# Patient Record
Sex: Male | Born: 1985 | Race: White | Hispanic: No | Marital: Single | Smoking: Current some day smoker
Health system: Southern US, Community
[De-identification: ages and names within clinical notes are randomized; demographics above are authoritative.]

## PROBLEM LIST (undated history)

## (undated) DIAGNOSIS — K760 Fatty (change of) liver, not elsewhere classified: Secondary | ICD-10-CM

## (undated) DIAGNOSIS — F419 Anxiety disorder, unspecified: Secondary | ICD-10-CM

## (undated) DIAGNOSIS — K219 Gastro-esophageal reflux disease without esophagitis: Secondary | ICD-10-CM

## (undated) DIAGNOSIS — I10 Essential (primary) hypertension: Secondary | ICD-10-CM

## (undated) DIAGNOSIS — E785 Hyperlipidemia, unspecified: Secondary | ICD-10-CM

## (undated) DIAGNOSIS — J45909 Unspecified asthma, uncomplicated: Secondary | ICD-10-CM

## (undated) HISTORY — DX: Essential (primary) hypertension: I10

## (undated) HISTORY — DX: Unspecified asthma, uncomplicated: J45.909

## (undated) HISTORY — DX: Anxiety disorder, unspecified: F41.9

## (undated) HISTORY — DX: Hyperlipidemia, unspecified: E78.5

## (undated) HISTORY — DX: Fatty (change of) liver, not elsewhere classified: K76.0

## (undated) HISTORY — DX: Gastro-esophageal reflux disease without esophagitis: K21.9

---

## 2008-03-28 ENCOUNTER — Ambulatory Visit: Payer: Self-pay | Admitting: Family Medicine

## 2008-04-21 ENCOUNTER — Encounter: Payer: Self-pay | Admitting: Family Medicine

## 2008-11-28 ENCOUNTER — Ambulatory Visit: Payer: Self-pay | Admitting: Family Medicine

## 2008-11-28 DIAGNOSIS — H6120 Impacted cerumen, unspecified ear: Secondary | ICD-10-CM | POA: Insufficient documentation

## 2008-11-28 DIAGNOSIS — L259 Unspecified contact dermatitis, unspecified cause: Secondary | ICD-10-CM | POA: Insufficient documentation

## 2008-12-23 ENCOUNTER — Ambulatory Visit: Payer: Self-pay | Admitting: Family Medicine

## 2008-12-23 DIAGNOSIS — J019 Acute sinusitis, unspecified: Secondary | ICD-10-CM | POA: Insufficient documentation

## 2009-05-13 ENCOUNTER — Ambulatory Visit: Payer: Self-pay | Admitting: Family Medicine

## 2010-12-22 ENCOUNTER — Emergency Department: Payer: Self-pay | Admitting: Emergency Medicine

## 2015-04-30 ENCOUNTER — Other Ambulatory Visit: Payer: Self-pay | Admitting: Family Medicine

## 2015-05-01 LAB — CMP12+LP+TP+TSH+6AC+CBC/D/PLT
A/G RATIO: 1.9 (ref 1.1–2.5)
ALBUMIN: 4.5 g/dL (ref 3.5–5.5)
ALK PHOS: 55 IU/L (ref 39–117)
ALT: 28 IU/L (ref 0–44)
AST: 25 IU/L (ref 0–40)
BASOS: 0 %
BILIRUBIN TOTAL: 1 mg/dL (ref 0.0–1.2)
BUN/Creatinine Ratio: 10 (ref 8–19)
BUN: 10 mg/dL (ref 6–20)
Basophils Absolute: 0 10*3/uL (ref 0.0–0.2)
CHOLESTEROL TOTAL: 205 mg/dL — AB (ref 100–199)
Calcium: 9.5 mg/dL (ref 8.7–10.2)
Chloride: 99 mmol/L (ref 97–106)
Chol/HDL Ratio: 5.5 ratio units — ABNORMAL HIGH (ref 0.0–5.0)
Creatinine, Ser: 0.99 mg/dL (ref 0.76–1.27)
EOS (ABSOLUTE): 0.3 10*3/uL (ref 0.0–0.4)
Eos: 5 %
Estimated CHD Risk: 1.2 times avg. — ABNORMAL HIGH (ref 0.0–1.0)
FREE THYROXINE INDEX: 1.9 (ref 1.2–4.9)
GFR calc Af Amer: 118 mL/min/{1.73_m2} (ref >59)
GFR calc non Af Amer: 102 mL/min/{1.73_m2} (ref >59)
GGT: 29 IU/L (ref 0–65)
GLOBULIN, TOTAL: 2.4 g/dL (ref 1.5–4.5)
Glucose: 85 mg/dL (ref 65–99)
HDL: 37 mg/dL — AB (ref >39)
HEMATOCRIT: 45.9 % (ref 37.5–51.0)
Hemoglobin: 15.6 g/dL (ref 12.6–17.7)
IMMATURE GRANS (ABS): 0 10*3/uL (ref 0.0–0.1)
IMMATURE GRANULOCYTES: 0 %
Iron: 126 ug/dL (ref 38–169)
LDH: 178 IU/L (ref 121–224)
LDL CALC: 145 mg/dL — AB (ref 0–99)
LYMPHS: 41 %
Lymphocytes Absolute: 2.5 10*3/uL (ref 0.7–3.1)
MCH: 30.4 pg (ref 26.6–33.0)
MCHC: 34 g/dL (ref 31.5–35.7)
MCV: 89 fL (ref 79–97)
MONOCYTES: 8 %
MONOS ABS: 0.5 10*3/uL (ref 0.1–0.9)
NEUTROS PCT: 46 %
Neutrophils Absolute: 2.7 10*3/uL (ref 1.4–7.0)
Phosphorus: 3 mg/dL (ref 2.5–4.5)
Platelets: 261 10*3/uL (ref 150–379)
Potassium: 4.6 mmol/L (ref 3.5–5.2)
RBC: 5.14 x10E6/uL (ref 4.14–5.80)
RDW: 13.9 % (ref 12.3–15.4)
SODIUM: 140 mmol/L (ref 136–144)
T3 Uptake Ratio: 29 % (ref 24–39)
T4, Total: 6.7 ug/dL (ref 4.5–12.0)
TRIGLYCERIDES: 117 mg/dL (ref 0–149)
TSH: 0.96 u[IU]/mL (ref 0.450–4.500)
Total Protein: 6.9 g/dL (ref 6.0–8.5)
Uric Acid: 4.9 mg/dL (ref 3.7–8.6)
VLDL CHOLESTEROL CAL: 23 mg/dL (ref 5–40)
WBC: 6 10*3/uL (ref 3.4–10.8)

## 2015-05-01 LAB — HGB A1C W/O EAG: Hgb A1c MFr Bld: 5.6 % (ref 4.8–5.6)

## 2016-06-10 ENCOUNTER — Other Ambulatory Visit: Payer: Self-pay | Admitting: Family Medicine

## 2016-09-26 ENCOUNTER — Ambulatory Visit: Payer: Self-pay | Admitting: *Deleted

## 2016-09-26 DIAGNOSIS — W57XXXA Bitten or stung by nonvenomous insect and other nonvenomous arthropods, initial encounter: Secondary | ICD-10-CM

## 2016-09-26 NOTE — Progress Notes (Signed)
Pt presents with what he believes to be spider bite to L posterior FA. Occurred yesterday. Some mild swelling, erythema, and warmth present at site with one visible central lesion. No streaking present. No drainage per pt. No scabbing present. Red, swollen area marked with pen for re-eval tomorrow. Oval shaped with largest circumference approx 2 inches wide. Hydrocortisone cream provided for itching. Pt denies pain. Sts this is similar to a bite that he had last year on his leg that healed well with no intervention.

## 2016-09-27 ENCOUNTER — Ambulatory Visit: Payer: Self-pay | Admitting: Registered Nurse

## 2016-09-27 VITALS — BP 120/86 | HR 60 | Temp 98.1°F

## 2016-09-27 DIAGNOSIS — S50862A Insect bite (nonvenomous) of left forearm, initial encounter: Secondary | ICD-10-CM

## 2016-09-27 DIAGNOSIS — L03114 Cellulitis of left upper limb: Secondary | ICD-10-CM

## 2016-09-27 MED ORDER — TRIPLE ANTIBIOTIC 5-400-5000 EX OINT: TOPICAL_OINTMENT | Freq: Four times a day (QID) | CUTANEOUS | 0 refills | Status: AC

## 2016-09-27 MED ORDER — HYDROCORTISONE 1 % EX LOTN: 1.0000 "application " | TOPICAL_LOTION | Freq: Two times a day (BID) | CUTANEOUS | 0 refills | Status: AC | PRN

## 2016-09-27 MED ORDER — CEPHALEXIN 500 MG PO CAPS: 500.0000 mg | ORAL_CAPSULE | Freq: Two times a day (BID) | ORAL | 0 refills | Status: AC
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
## 2016-09-27 NOTE — Patient Instructions (Addendum)
Do not scratch affected area Elevate if swelling Wash area with soap and water twice a day cover with bandaid after applying triple antibiotic ointment given from clinic stock Hydrocortisone 1% to affected area prn four times a day if itching given from clinic stock UD x 2 Calagel twice a day as needed for itching given from clinic stock 4 UD If redness expands to elbow follow up for re-evaluation same day Cryotherapy/ice 15 minutes 4 times per day  Cellulitis, Adult Cellulitis is a skin infection. The infected area is usually red and tender. This condition occurs most often in the arms and lower legs. The infection can travel to the muscles, blood, and underlying tissue and become serious. It is very important to get treated for this condition. What are the causes? Cellulitis is caused by bacteria. The bacteria enter through a break in the skin, such as a cut, burn, insect bite, open sore, or crack. What increases the risk? This condition is more likely to occur in people who:  Have a weak defense system (immune system).  Have open wounds on the skin such as cuts, burns, bites, and scrapes. Bacteria can enter the body through these open wounds.  Are older.  Have diabetes.  Have a type of long-lasting (chronic) liver disease (cirrhosis) or kidney disease.  Use IV drugs. What are the signs or symptoms? Symptoms of this condition include:  Redness, streaking, or spotting on the skin.  Swollen area of the skin.  Tenderness or pain when an area of the skin is touched.  Warm skin.  Fever.  Chills.  Blisters. How is this diagnosed? This condition is diagnosed based on a medical history and physical exam. You may also have tests, including:  Blood tests.  Lab tests.  Imaging tests. How is this treated? Treatment for this condition may include:  Medicines, such as antibiotic medicines or antihistamines.  Supportive care, such as rest and application of cold or warm  cloths (cold or warm compresses) to the skin.  Hospital care, if the condition is severe. The infection usually gets better within 1-2 days of treatment. Follow these instructions at home:  Take over-the-counter and prescription medicines only as told by your health care provider.  If you were prescribed an antibiotic medicine, take it as told by your health care provider. Do not stop taking the antibiotic even if you start to feel better.  Drink enough fluid to keep your urine clear or pale yellow.  Do not touch or rub the infected area.  Raise (elevate) the infected area above the level of your heart while you are sitting or lying down.  Apply warm or cold compresses to the affected area as told by your health care provider.  Keep all follow-up visits as told by your health care provider. This is important. These visits let your health care provider make sure a more serious infection is not developing. Contact a health care provider if:  You have a fever.  Your symptoms do not improve within 1-2 days of starting treatment.  Your bone or joint underneath the infected area becomes painful after the skin has healed.  Your infection returns in the same area or another area.  You notice a swollen bump in the infected area.  You develop new symptoms.  You have a general ill feeling (malaise) with muscle aches and pains. Get help right away if:  Your symptoms get worse.  You feel very sleepy.  You develop vomiting or diarrhea that persists.  You notice red streaks coming from the infected area.  Your red area gets larger or turns dark in color. This information is not intended to replace advice given to you by your health care provider. Make sure you discuss any questions you have with your health care provider. Document Released: 03/09/2005 Document Revised: 10/08/2015 Document Reviewed: 04/08/2015 Elsevier Interactive Patient Education  2017 [place],  Adult An insect bite can make your skin red, itchy, and swollen. An insect bite is different from an insect sting, which happens when an insect injects poison (venom) into the skin. Some insects can spread disease to people through a bite. However, most insect bites do not lead to disease and are not serious. What are the causes? Insects may bite for a variety of reasons, including:  Hunger.  To defend themselves. Insects that bite include:  Spiders.  Mosquitoes.  Ticks.  Fleas.  Ants.  Flies.  Bedbugs. What are the signs or symptoms? Symptoms of this condition include:  Itching or pain in the bite area.  Redness and swelling in the bite area.  An open wound (skin ulcer). In many cases, symptoms last for 2-4 days. How is this diagnosed? This condition is usually diagnosed based on symptoms and a physical exam. How is this treated? Treatment is usually not needed. Symptoms often go away on their own. When treatment is recommended, it may involve:  Applying a cream or lotion to the bitten area. This treatment helps with itching.  Taking an antibiotic medicine. This treatment is needed if the bite area gets infected.  Getting a tetanus shot.  Applying ice to the affected area.  Medicines called antihistamines. This treatment is needed if you develop an allergic reaction to the insect bite. Follow these instructions at home: Bite area care   Do not scratch the bite area.  Keep the bite area clean and dry. Wash it every day with soap and water as told by your health care provider.  Check the bite area every day for signs of infection. Check for:  More redness, swelling, or pain.  Fluid or blood.  Warmth.  Pus. Managing pain, itching, and swelling    You may apply a baking soda paste, cortisone cream, or calamine lotion to the bite area as told by your health care provider.  If directed, applyice to the bite area.  Put ice in a plastic bag.  Place a  towel between your skin and the bag.  Leave the ice on for 20 minutes, 2-3 times per day. Medicines   Apply or take over-the-counter and prescription medicines only as told by your health care provider.  If you were prescribed an antibiotic medicine, use it as told by your health care provider. Do not stop using the antibiotic even if your condition improves. General instructions   Keep all follow-up visits as told by your health care provider. This is important. How is this prevented? To help reduce your risk of insect bites:  When you are outdoors, wear clothing that covers your arms and legs.  Use insect repellent. The best insect repellents contain:  DEET, picaridin, oil of lemon eucalyptus (OLE), or IR3535.  Higher amounts of an active ingredient.  If your home windows do not have screens, consider installing them. Contact a health care provider if:  You have more redness, swelling, or pain in the bite area.  You have fluid, blood, or pus coming from the bite area.  The bite area feels warm to  the touch.  You have a fever. Get help right away if:  You have joint pain.  You have a rash.  You have shortness of breath.  You feel unusually tired or sleepy.  You have neck pain.  You have a headache.  You have unusual weakness.  You have chest pain.  You have nausea, vomiting, or pain in the abdomen. This information is not intended to replace advice given to you by your health care provider. Make sure you discuss any questions you have with your health care provider. Document Released: 07/07/2004 Document Revised: 01/27/2016 Document Reviewed: 12/07/2015 Elsevier Interactive Patient Education  2017 [employer].

## 2016-10-06 ENCOUNTER — Ambulatory Visit: Payer: Self-pay | Admitting: Registered Nurse

## 2016-10-06 VITALS — BP 135/89 | HR 69 | Temp 97.7°F

## 2016-10-06 DIAGNOSIS — R69 Illness, unspecified: Principal | ICD-10-CM

## 2016-10-06 DIAGNOSIS — H6593 Unspecified nonsuppurative otitis media, bilateral: Secondary | ICD-10-CM

## 2016-10-06 DIAGNOSIS — J111 Influenza due to unidentified influenza virus with other respiratory manifestations: Secondary | ICD-10-CM

## 2016-10-06 MED ORDER — ONDANSETRON HCL 8 MG PO TABS: 8.0000 mg | ORAL_TABLET | Freq: Two times a day (BID) | ORAL | 0 refills | Status: AC | PRN

## 2016-10-06 MED ORDER — SALINE SPRAY 0.65 % NA SOLN: 2.0000 | NASAL | 0 refills | Status: DC

## 2016-10-06 MED ORDER — OSELTAMIVIR PHOSPHATE 75 MG PO CAPS: 75.0000 mg | ORAL_CAPSULE | Freq: Two times a day (BID) | ORAL | 0 refills | Status: AC

## 2016-10-06 MED ORDER — FLUTICASONE PROPIONATE 50 MCG/ACT NA SUSP: 1.0000 | Freq: Two times a day (BID) | NASAL | 0 refills | Status: DC

## 2016-10-06 NOTE — Patient Instructions (Addendum)
If vomiting greater than 3 days follow up for re-evaluation, unable to urinate every 8 hours, coughing up blood, vomiting/peeing/pooping blood, shortness of breath, wheezing and/or lethargy/confusion.  Viral Gastroenteritis, Adult Viral gastroenteritis is also known as the stomach flu. This condition is caused by various viruses. These viruses can be passed from person to person very easily (are very contagious). This condition may affect your stomach, small intestine, and large intestine. It can cause sudden watery diarrhea, fever, and vomiting. Diarrhea and vomiting can make you feel weak and cause you to become dehydrated. You may not be able to keep fluids down. Dehydration can make you tired and thirsty, cause you to have a dry mouth, and decrease how often you urinate. Older adults and people with other diseases or a weak immune system are at higher risk for dehydration. It is important to replace the fluids that you lose from diarrhea and vomiting. If you become severely dehydrated, you may need to get fluids through an IV tube. What are the causes? Gastroenteritis is caused by various viruses, including rotavirus and norovirus. Norovirus is the most common cause in adults. You can get sick by eating food, drinking water, or touching a surface contaminated with one of these viruses. You can also get sick from sharing utensils or other personal items with an infected person. What increases the risk? This condition is more likely to develop in people:  Who have a weak defense system (immune system).  Who live with one or more children who are younger than 21 years old.  Who live in a nursing home.  Who go on cruise ships. What are the signs or symptoms? Symptoms of this condition start suddenly 1-2 days after exposure to a virus. Symptoms may last a few days or as long as a week. The most common symptoms are watery diarrhea and vomiting. Other symptoms  include:  Fever.  Headache.  Fatigue.  Pain in the abdomen.  Chills.  Weakness.  Nausea.  Muscle aches.  Loss of appetite. How is this diagnosed? This condition is diagnosed with a medical history and physical exam. You may also have a stool test to check for viruses or other infections. How is this treated? This condition typically goes away on its own. The focus of treatment is to restore lost fluids (rehydration). Your health care provider may recommend that you take an oral rehydration solution (ORS) to replace important salts and minerals (electrolytes) in your body. Severe cases of this condition may require giving fluids through an IV tube. Treatment may also include medicine to help with your symptoms. Follow these instructions at home: Follow instructions from your health care provider about how to care for yourself at home. Eating and drinking  Follow these recommendations as told by your health care provider:  Take an ORS. This is a drink that is sold at pharmacies and retail stores.  Drink clear fluids in small amounts as you are able. Clear fluids include water, ice chips, diluted fruit juice, and low-calorie sports drinks.  Eat bland, easy-to-digest foods in small amounts as you are able. These foods include bananas, applesauce, rice, lean meats, toast, and crackers.  Avoid fluids that contain a lot of sugar or caffeine, such as energy drinks, sports drinks, and soda.  Avoid alcohol.  Avoid spicy or fatty foods. General instructions    Drink enough fluid to keep your urine clear or pale yellow.  Wash your hands often. If soap and water are not available, use hand sanitizer.  Make sure that all people in your household wash their hands well and often.  Take over-the-counter and prescription medicines only as told by your health care provider.  Rest at home while you recover.  Watch your condition for any changes.  Take a warm bath to relieve any  burning or pain from frequent diarrhea episodes.  Keep all follow-up visits as told by your health care provider. This is important. Contact a health care provider if:  You cannot keep fluids down.  Your symptoms get worse.  You have new symptoms.  You feel light-headed or dizzy.  You have muscle cramps. Get help right away if:  You have chest pain.  You feel extremely weak or you faint.  You see blood in your vomit.  Your vomit looks like coffee grounds.  You have bloody or black stools or stools that look like tar.  You have a severe headache, a stiff neck, or both.  You have a rash.  You have severe pain, cramping, or bloating in your abdomen.  You have trouble breathing or you are breathing very quickly.  Your heart is beating very quickly.  Your skin feels cold and clammy.  You feel confused.  You have pain when you urinate.  You have signs of dehydration, such as:  Dark urine, very little urine, or no urine.  Cracked lips.  Dry mouth.  Sunken eyes.  Sleepiness.  Weakness. This information is not intended to replace advice given to you by your health care provider. Make sure you discuss any questions you have with your health care provider. Document Released: 05/30/2005 Document Revised: 11/11/2015 Document Reviewed: 02/03/2015 Elsevier Interactive Patient Education  2017 [place].   Influenza, Adult Influenza, more commonly known as "the flu," is a viral infection that primarily affects the respiratory tract. The respiratory tract includes organs that help you breathe, such as the lungs, nose, and throat. The flu causes many common cold symptoms, as well as a high fever and body aches. The flu spreads easily from person to person (is contagious). Getting a flu shot (influenza vaccination) every year is the best way to prevent influenza. What are the causes? Influenza is caused by a virus. You can catch the virus by:  Breathing in droplets  from an infected person's cough or sneeze.  Touching something that was recently contaminated with the virus and then touching your mouth, nose, or eyes. What increases the risk? The following factors may make you more likely to get the flu:  Not cleaning your hands frequently with soap and water or alcohol-based hand sanitizer.  Having close contact with many people during cold and flu season.  Touching your mouth, eyes, or nose without washing or sanitizing your hands first.  Not drinking enough fluids or not eating a healthy diet.  Not getting enough sleep or exercise.  Being under a high amount of stress.  Not getting a yearly (annual) flu shot. You may be at a higher risk of complications from the flu, such as a severe lung infection (pneumonia), if you:  Are over the age of 26.  Are pregnant.  Have a weakened disease-fighting system (immune system). You may have a weakened immune system if you:  Have HIV or AIDS.  Are undergoing chemotherapy.  Aretaking medicines that reduce the activity of (suppress) the immune system.  Have a long-term (chronic) illness, such as heart disease, kidney disease, diabetes, or lung disease.  Have a liver disorder.  Are obese.  Have anemia.  What are the signs or symptoms? Symptoms of this condition typically last 4-10 days and may include:  Fever.  Chills.  Headache, body aches, or muscle aches.  Sore throat.  Cough.  Runny or congested nose.  Chest discomfort and cough.  Poor appetite.  Weakness or tiredness (fatigue).  Dizziness.  Nausea or vomiting. How is this diagnosed? This condition may be diagnosed based on your medical history and a physical exam. Your health care provider may do a nose or throat swab test to confirm the diagnosis. How is this treated? If influenza is detected early, you can be treated with antiviral medicine that can reduce the length of your illness and the severity of your symptoms.  This medicine may be given by mouth (orally) or through an IV tube that is inserted in one of your veins. The goal of treatment is to relieve symptoms by taking care of yourself at home. This may include taking over-the-counter medicines, drinking plenty of fluids, and adding humidity to the air in your home. In some cases, influenza goes away on its own. Severe influenza or complications from influenza may be treated in a hospital. Follow these instructions at home:  Take over-the-counter and prescription medicines only as told by your health care provider.  Use a cool mist humidifier to add humidity to the air in your home. This can make breathing easier.  Rest as needed.  Drink enough fluid to keep your urine clear or pale yellow.  Cover your mouth and nose when you cough or sneeze.  Wash your hands with soap and water often, especially after you cough or sneeze. If soap and water are not available, use hand sanitizer.  Stay home from work or school as told by your health care provider. Unless you are visiting your health care provider, try to avoid leaving home until your fever has been gone for 24 hours without the use of medicine.  Keep all follow-up visits as told by your health care provider. This is important. How is this prevented?  Getting an annual flu shot is the best way to avoid getting the flu. You may get the flu shot in late summer, fall, or winter. Ask your health care provider when you should get your flu shot.  Wash your hands often or use hand sanitizer often.  Avoid contact with people who are sick during cold and flu season.  Eat a healthy diet, drink plenty of fluids, get enough sleep, and exercise regularly. Contact a health care provider if:  You develop new symptoms.  You have:  Chest pain.  Diarrhea.  A fever.  Your cough gets worse.  You produce more mucus.  You feel nauseous or you vomit. Get help right away if:  You develop shortness of  breath or difficulty breathing.  Your skin or nails turn a bluish color.  You have severe pain or stiffness in your neck.  You develop a sudden headache or sudden pain in your face or ear.  You cannot stop vomiting. This information is not intended to replace advice given to you by your health care provider. Make sure you discuss any questions you have with your health care provider. Document Released: 05/27/2000 Document Revised: 11/05/2015 Document Reviewed: 03/24/2015 Elsevier Interactive Patient Education  2017 [employer].

## 2016-10-06 NOTE — Progress Notes (Signed)
Subjective:    Patient ID: Bruce Kemp, male    DOB: 1985/06/18, 31 y.o.   MRN: [id number]  30y/o single caucasian male reports yesterday having progressively worsening body aches, fatigue, chills, nausea, sore throat and nasal congestion and drainage. Much improved upon waking this am, but mild sx still present. Takes etodolac po BID.  Was hot/cold/sweaty bed last night.  Exposed to sick coworkers in past week.  Denied sinus congestion/pressure.      Review of Systems  Constitutional: Positive for activity change, appetite change, chills, diaphoresis and fatigue. Negative for fever and unexpected weight change.  HENT: Positive for postnasal drip, rhinorrhea, sneezing and sore throat. Negative for congestion, dental problem, drooling, ear discharge, ear pain, facial swelling, hearing loss, mouth sores, nosebleeds, sinus pain, sinus pressure, tinnitus, trouble swallowing and voice change.   Eyes: Negative for photophobia, pain, discharge, redness, itching and visual disturbance.  Respiratory: Positive for cough. Negative for choking, chest tightness, shortness of breath, wheezing and stridor.   Cardiovascular: Negative for chest pain, palpitations and leg swelling.  Gastrointestinal: Positive for nausea. Negative for abdominal distention, abdominal pain, blood in stool, constipation, diarrhea and vomiting.  Endocrine: Negative for cold intolerance and heat intolerance.  Genitourinary: Negative for dysuria.  Musculoskeletal: Positive for myalgias. Negative for arthralgias, back pain, gait problem, joint swelling, neck pain and neck stiffness.  Skin: Negative for color change, pallor, rash and wound.  Allergic/Immunologic: Positive for environmental allergies. Negative for food allergies and immunocompromised state.  Neurological: Positive for headaches. Negative for dizziness, tremors, seizures, syncope, facial asymmetry, speech difficulty, weakness, light-headedness and numbness.   Hematological: Negative for adenopathy. Does not bruise/bleed easily.  Psychiatric/Behavioral: Negative for agitation, behavioral problems, confusion and sleep disturbance.       Objective:   Physical Exam  Constitutional: He is oriented to person, place, and time. Vital signs are normal. He appears well-developed and well-nourished. He is active and cooperative.  Non-toxic appearance. He does not have a sickly appearance. He appears ill. No distress.  HENT:  Head: Normocephalic and atraumatic.  Right Ear: Hearing, external ear and ear canal normal. Tympanic membrane is scarred. A middle ear effusion is present.  Left Ear: Hearing, external ear and ear canal normal. Tympanic membrane is scarred. A middle ear effusion is present.  Ears:  Nose: Mucosal edema and rhinorrhea present. No nose lacerations, sinus tenderness, nasal deformity, septal deviation or nasal septal hematoma. No epistaxis.  No foreign bodies. Right sinus exhibits no maxillary sinus tenderness and no frontal sinus tenderness. Left sinus exhibits no maxillary sinus tenderness and no frontal sinus tenderness.  Mouth/Throat: Uvula is midline and mucous membranes are normal. Mucous membranes are not pale, not dry and not cyanotic. He does not have dentures. No oral lesions. No trismus in the jaw. Normal dentition. No dental abscesses, uvula swelling, lacerations or dental caries. Posterior oropharyngeal edema and posterior oropharyngeal erythema present. No oropharyngeal exudate or tonsillar abscesses.  Cobblestoning posterior pharynx; bilateral nasal turbinates edema/erythema clear discharge; bilateral allergic shiners; bilateral  TMs air fluid level clear and scarring noted see graphic  Eyes: Conjunctivae, EOM and lids are normal. Pupils are equal, round, and reactive to light. Right eye exhibits no chemosis, no discharge, no exudate and no hordeolum. No foreign body present in the right eye. Left eye exhibits no chemosis, no  discharge, no exudate and no hordeolum. No foreign body present in the left eye. Right conjunctiva is not injected. Right conjunctiva has no hemorrhage. Left conjunctiva is not injected.  Left conjunctiva has no hemorrhage. No scleral icterus. Right eye exhibits normal extraocular motion and no nystagmus. Left eye exhibits normal extraocular motion and no nystagmus. Right pupil is round and reactive. Left pupil is round and reactive. Pupils are equal.  Neck: Trachea normal and normal range of motion. Neck supple. No tracheal tenderness, no spinous process tenderness and no muscular tenderness present. No neck rigidity. No tracheal deviation, no edema, no erythema and normal range of motion present. No thyroid mass and no thyromegaly present.  Cardiovascular: Normal rate, regular rhythm, S1 normal, S2 normal, normal heart sounds and intact distal pulses.  PMI is not displaced.  Exam reveals no gallop and no friction rub.   No murmur heard. Pulmonary/Chest: Effort normal and breath sounds normal. No stridor. No respiratory distress. He has no decreased breath sounds. He has no wheezes. He has no rhonchi. He has no rales.  Speaks full sentences without difficulty no cough observed in exam room  Abdominal: Soft. Normal appearance. He exhibits no shifting dullness, no distension, no pulsatile liver, no fluid wave, no abdominal bruit, no ascites, no pulsatile midline mass and no mass. Bowel sounds are decreased. There is no hepatosplenomegaly. There is no tenderness. There is no rigidity, no rebound, no guarding, no CVA tenderness, no tenderness at McBurney's point and negative Murphy's sign. No hernia. Hernia confirmed negative in the ventral area.  Dull to percussion x 4 quads; hypoactive bowel sounds x 4 quads  Musculoskeletal: Normal range of motion. He exhibits no edema or tenderness.       Right shoulder: Normal.       Left shoulder: Normal.       Right elbow: Normal.      Left elbow: Normal.       Right  hip: Normal.       Left hip: Normal.       Right knee: Normal.       Left knee: Normal.       Right ankle: Normal.       Left ankle: Normal.       Cervical back: Normal.       Thoracic back: Normal.       Lumbar back: Normal.       Right hand: Normal.       Left hand: Normal.  Lymphadenopathy:       Head (right side): No submental, no submandibular, no tonsillar, no preauricular, no posterior auricular and no occipital adenopathy present.       Head (left side): No submental, no submandibular, no tonsillar, no preauricular, no posterior auricular and no occipital adenopathy present.    He has no cervical adenopathy.       Right cervical: No superficial cervical, no deep cervical and no posterior cervical adenopathy present.      Left cervical: No superficial cervical, no deep cervical and no posterior cervical adenopathy present.  Neurological: He is alert and oriented to person, place, and time. He has normal strength. He displays no atrophy and no tremor. No cranial nerve deficit or sensory deficit. He exhibits normal muscle tone. He displays no seizure activity. Coordination and gait normal. GCS eye subscore is 4. GCS verbal subscore is 5. GCS motor subscore is 6.  Bilateral hand grasp equal 5/5 on/off exam table without difficulty in out chair without difficulty gait sure and steady hall  Skin: Skin is warm, dry and intact. No abrasion, no bruising, no burn, no ecchymosis, no laceration, no lesion, no petechiae and no rash  noted. He is not diaphoretic. No cyanosis or erythema. No pallor. Nails show no clubbing.  Psychiatric: He has a normal mood and affect. His speech is normal and behavior is normal. Judgment and thought content normal. Cognition and memory are normal.  Nursing note and vitals reviewed.         Assessment & Plan:  A-influenza like illness and otitis media effusion bilaterally  P-given nasal saline 1 from clinic stock; flonase at home restart; continue etodolac;  phenyleprine 5mg  po q6h prn rhinitis 4 UD given from clinic stock Electronic Rx tamiflu 75mg  po BID x 5 days #10 RF0 and zofran 8mg  ODT po BID prn nausea/vomiting.  Hydrate hydrate hydrate.if unable to maintain po intake or urine orange/brown/unable to void every 8 hours follow up with PCM/UCC/ER for re-evaluation.  Honey with lemon.  Avoid motrin/advil/aleve/naproxen due to etodolac use.  Suspect Viral illness: no evidence of invasive bacterial infection, non toxic and well hydrated.  This is most likely self limiting viral infection.  I do not see where any further testing or imaging is necessary at this time.   I will suggest supportive care, rest, good hygiene and encourage the patient to take adequate fluids.  Does not require work excuse. Patient to talk with supervisor discussed recommend staying home until afebrile 24 hours off etodolac. nasal saline 1-2 sprays each nostril prn q2h wa prn congestion,.  Discussed honey with lemon and salt water gargles for comfort also.  The patient is to return to clinic or EMERGENCY ROOM if symptoms worsen or change significantly e.g. Dyspnea, dysphagia, vomiting, lethargy, SOB, wheezing. Patient verbalized agreement and understanding of treatment plan and had no further questions at this time.  Supportive treatment.   No evidence of invasive bacterial infection, non toxic and well hydrated.  This is most likely self limiting viral infection.  I do not see where any further testing or imaging is necessary at this time.   I will suggest supportive care, rest, good hygiene and encourage the patient to take adequate fluids.  The patient is to return to clinic or EMERGENCY ROOM if symptoms worsen or change significantly e.g. ear pain, fever, purulent discharge from ears or bleeding.  Exitcare handout on otitis media with effusion given to patient.  Patient verbalized agreement and understanding of treatment plan.

## 2016-10-11 ENCOUNTER — Ambulatory Visit: Payer: Self-pay | Admitting: Registered Nurse

## 2016-10-11 VITALS — BP 129/90 | HR 70 | Temp 97.9°F

## 2016-10-11 DIAGNOSIS — J029 Acute pharyngitis, unspecified: Secondary | ICD-10-CM

## 2016-10-11 DIAGNOSIS — H1013 Acute atopic conjunctivitis, bilateral: Secondary | ICD-10-CM

## 2016-10-11 DIAGNOSIS — J209 Acute bronchitis, unspecified: Secondary | ICD-10-CM

## 2016-10-11 DIAGNOSIS — J309 Allergic rhinitis, unspecified: Secondary | ICD-10-CM

## 2016-10-11 DIAGNOSIS — H6593 Unspecified nonsuppurative otitis media, bilateral: Secondary | ICD-10-CM

## 2016-10-11 MED ORDER — ALBUTEROL SULFATE HFA 108 (90 BASE) MCG/ACT IN AERS: 1.0000 | INHALATION_SPRAY | RESPIRATORY_TRACT | 0 refills | Status: DC | PRN

## 2016-10-11 MED ORDER — CETIRIZINE HCL 10 MG PO TABS: 10.0000 mg | ORAL_TABLET | Freq: Every day | ORAL | 11 refills | Status: DC

## 2016-10-11 MED ORDER — KETOTIFEN FUMARATE 0.025 % OP SOLN: 1.0000 [drp] | Freq: Two times a day (BID) | OPHTHALMIC | 0 refills | Status: AC

## 2016-10-11 MED ORDER — AMOXICILLIN-POT CLAVULANATE 875-125 MG PO TABS: 1.0000 | ORAL_TABLET | Freq: Two times a day (BID) | ORAL | 0 refills | Status: AC

## 2016-10-11 MED ORDER — ERYTHROMYCIN 5 MG/GM OP OINT: 1.0000 "application " | TOPICAL_OINTMENT | Freq: Three times a day (TID) | OPHTHALMIC | 0 refills | Status: AC

## 2016-10-11 MED ORDER — CARBOXYMETHYLCELLULOSE SODIUM 1 % OP SOLN: 1.0000 [drp] | Freq: Three times a day (TID) | OPHTHALMIC | 12 refills | Status: AC

## 2016-10-11 NOTE — Patient Instructions (Addendum)
start OTC antihistamine (zyrtec or claritin 10mg  by mouth daily) Continue etodolac twice a day by mouth, flonase 1 spray each nostril twice a day and nasal saline in the shower Shower twice a day Do not sleep with the windows open Honey with lemon Hydrate nyquil 1 teaspoon at bedtime as needed for cough per manufacturer's instructions If wheezing/protracted coughing start albuterol inhaler 1-2 puffs by mouth every 4-6 hours and follow up for re-evaluation if you have been taking augmentin 875mg  by mouth every 12 hours for 48 hours already (take augmentin for 10 days) Start refresh plus 2 drops each eye three times per day as needed for irritation/dryness due to any cause Clean linens/pillowcase more frequently if discharge on them Avoid rubbing eyes, wash hands frequently Erythromycin opthalmic ointment 1cm ribbon into bilateral eyes at least every 12 hours x 1 week Ketotifen 1 drop each eye twice a day for 30 days as needed for itching/irritation due to seasonal allergies Follow up re-evaluation if shortness of breath, coughing up blood, visual changes, headaches, fever greater than 100.5 and/or no improvement with plan of care x 48 hours   Allergic Rhinitis Allergic rhinitis is when the mucous membranes in the nose respond to allergens. Allergens are particles in the air that cause your body to have an allergic reaction. This causes you to release allergic antibodies. Through a chain of events, these eventually cause you to release histamine into the blood stream. Although meant to protect the body, it is this release of histamine that causes your discomfort, such as frequent sneezing, congestion, and an itchy, runny nose. What are the causes? Seasonal allergic rhinitis (hay fever) is caused by pollen allergens that may come from grasses, trees, and weeds. Year-round allergic rhinitis (perennial allergic rhinitis) is caused by allergens such as house dust mites, pet dander, and mold  spores. What are the signs or symptoms?  Nasal stuffiness (congestion).  Itchy, runny nose with sneezing and tearing of the eyes. How is this diagnosed? Your health care provider can help you determine the allergen or allergens that trigger your symptoms. If you and your health care provider are unable to determine the allergen, skin or blood testing may be used. Your health care provider will diagnose your condition after taking your health history and performing a physical exam. Your health care provider may assess you for other related conditions, such as asthma, pink eye, or an ear infection. How is this treated? Allergic rhinitis does not have a cure, but it can be controlled by:  Medicines that block allergy symptoms. These may include allergy shots, nasal sprays, and oral antihistamines.  Avoiding the allergen. Hay fever may often be treated with antihistamines in pill or nasal spray forms. Antihistamines block the effects of histamine. There are over-the-counter medicines that may help with nasal congestion and swelling around the eyes. Check with your health care provider before taking or giving this medicine. If avoiding the allergen or the medicine prescribed do not work, there are many new medicines your health care provider can prescribe. Stronger medicine may be used if initial measures are ineffective. Desensitizing injections can be used if medicine and avoidance does not work. Desensitization is when a patient is given ongoing shots until the body becomes less sensitive to the allergen. Make sure you follow up with your health care provider if problems continue. Follow these instructions at home: It is not possible to completely avoid allergens, but you can reduce your symptoms by taking steps to limit your exposure  to them. It helps to know exactly what you are allergic to so that you can avoid your specific triggers. Contact a health care provider if:  You have a fever.  You  develop a cough that does not stop easily (persistent).  You have shortness of breath.  You start wheezing.  Symptoms interfere with normal daily activities. This information is not intended to replace advice given to you by your health care provider. Make sure you discuss any questions you have with your health care provider. Document Released: 02/22/2001 Document Revised: 01/29/2016 Document Reviewed: 02/04/2013 Elsevier Interactive Patient Education  2017 [employer] Acute Bronchitis, Adult Acute bronchitis is sudden (acute) swelling of the air tubes (bronchi) in the lungs. Acute bronchitis causes these tubes to fill with mucus, which can make it hard to breathe. It can also cause coughing or wheezing. In adults, acute bronchitis usually goes away within 2 weeks. A cough caused by bronchitis may last up to 3 weeks. Smoking, allergies, and asthma can make the condition worse. Repeated episodes of bronchitis may cause further lung problems, such as chronic obstructive pulmonary disease (COPD). What are the causes? This condition can be caused by germs and by substances that irritate the lungs, including:  Cold and flu viruses. This condition is most often caused by the same virus that causes a cold.  Bacteria.  Exposure to tobacco smoke, dust, fumes, and air pollution. What increases the risk? This condition is more likely to develop in people who:  Have close contact with someone with acute bronchitis.  Are exposed to lung irritants, such as tobacco smoke, dust, fumes, and vapors.  Have a weak immune system.  Have a respiratory condition such as asthma. What are the signs or symptoms? Symptoms of this condition include:  A cough.  Coughing up clear, yellow, or green mucus.  Wheezing.  Chest congestion.  Shortness of breath.  A fever.  Body aches.  Chills.  A sore throat. How is this diagnosed? This condition is usually diagnosed with a physical exam. During  the exam, your health care provider may order tests, such as chest X-rays, to rule out other conditions. He or she may also:  Test a sample of your mucus for bacterial infection.  Check the level of oxygen in your blood. This is done to check for pneumonia.  Do a chest X-ray or lung function testing to rule out pneumonia and other conditions.  Perform blood tests. Your health care provider will also ask about your symptoms and medical history. How is this treated? Most cases of acute bronchitis clear up over time without treatment. Your health care provider may recommend:  Drinking more fluids. Drinking more makes your mucus thinner, which may make it easier to breathe.  Taking a medicine for a fever or cough.  Taking an antibiotic medicine.  Using an inhaler to help improve shortness of breath and to control a cough.  Using a cool mist vaporizer or humidifier to make it easier to breathe. Follow these instructions at home: Medicines   Take over-the-counter and prescription medicines only as told by your health care provider.  If you were prescribed an antibiotic, take it as told by your health care provider. Do not stop taking the antibiotic even if you start to feel better. General instructions   Get plenty of rest.  Drink enough fluids to keep your urine clear or pale yellow.  Avoid smoking and secondhand smoke. Exposure to cigarette smoke or irritating chemicals will make bronchitis  worse. If you smoke and you need help quitting, ask your health care provider. Quitting smoking will help your lungs heal faster.  Use an inhaler, cool mist vaporizer, or humidifier as told by your health care provider.  Keep all follow-up visits as told by your health care provider. This is important. How is this prevented? To lower your risk of getting this condition again:  Wash your hands often with soap and water. If soap and water are not available, use hand sanitizer.  Avoid contact  with people who have cold symptoms.  Try not to touch your hands to your mouth, nose, or eyes.  Make sure to get the flu shot every year. Contact a health care provider if:  Your symptoms do not improve in 2 weeks of treatment. Get help right away if:  You cough up blood.  You have chest pain.  You have severe shortness of breath.  You become dehydrated.  You faint or keep feeling like you are going to faint.  You keep vomiting.  You have a severe headache.  Your fever or chills gets worse. This information is not intended to replace advice given to you by your health care provider. Make sure you discuss any questions you have with your health care provider. Document Released: 07/07/2004 Document Revised: 12/23/2015 Document Reviewed: 11/18/2015 Elsevier Interactive Patient Education  2017 [employer] Pharyngitis Pharyngitis is redness, pain, and swelling (inflammation) of your pharynx. What are the causes? Pharyngitis is usually caused by infection. Most of the time, these infections are from viruses (viral) and are part of a cold. However, sometimes pharyngitis is caused by bacteria (bacterial). Pharyngitis can also be caused by allergies. Viral pharyngitis may be spread from person to person by coughing, sneezing, and personal items or utensils (cups, forks, spoons, toothbrushes). Bacterial pharyngitis may be spread from person to person by more intimate contact, such as kissing. What are the signs or symptoms? Symptoms of pharyngitis include:  Sore throat.  Tiredness (fatigue).  Low-grade fever.  Headache.  Joint pain and muscle aches.  Skin rashes.  Swollen lymph nodes.  Plaque-like film on throat or tonsils (often seen with bacterial pharyngitis). How is this diagnosed? Your health care provider will ask you questions about your illness and your symptoms. Your medical history, along with a physical exam, is often all that is needed to diagnose pharyngitis.  Sometimes, a rapid strep test is done. Other lab tests may also be done, depending on the suspected cause. How is this treated? Viral pharyngitis will usually get better in 3-4 days without the use of medicine. Bacterial pharyngitis is treated with medicines that kill germs (antibiotics). Follow these instructions at home:  Drink enough water and fluids to keep your urine clear or pale yellow.  Only take over-the-counter or prescription medicines as directed by your health care provider:  If you are prescribed antibiotics, make sure you finish them even if you start to feel better.  Do not take aspirin.  Get lots of rest.  Gargle with 8 oz of salt water ( tsp of salt per 1 qt of water) as often as every 1-2 hours to soothe your throat.  Throat lozenges (if you are not at risk for choking) or sprays may be used to soothe your throat. Contact a health care provider if:  You have large, tender lumps in your neck.  You have a rash.  You cough up green, yellow-brown, or bloody spit. Get help right away if:  Your neck becomes  stiff.  You drool or are unable to swallow liquids.  You vomit or are unable to keep medicines or liquids down.  You have severe pain that does not go away with the use of recommended medicines.  You have trouble breathing (not caused by a stuffy nose). This information is not intended to replace advice given to you by your health care provider. Make sure you discuss any questions you have with your health care provider. Document Released: 05/30/2005 Document Revised: 11/05/2015 Document Reviewed: 02/04/2013 Elsevier Interactive Patient Education  2017 [employer] Allergic Conjunctivitis, Adult Allergic conjunctivitis is inflammation of the clear membrane that covers the white part of your eye and the inner surface of your eyelid (conjunctiva). The inflammation is caused by allergies. The blood vessels in the conjunctiva become inflamed and this causes the  eyes to become red or pink. The eyes often feel itchy. Allergic conjunctivitis cannot be spread from one person to another person (is not contagious). What are the causes? This condition is caused by an allergic reaction. Common causes of an allergic reaction (allergens) include:  Outdoor allergens, such as:  Pollen.  Grass and weeds.  Mold spores.  Indoor allergens, such as:  Dust.  Smoke.  Mold.  Pet dander.  Animal hair. What increases the risk? You may be more likely to develop this condition if you have a family history of allergies, such as:  Allergic rhinitis.  Bronchial asthma.  Atopic dermatitis. What are the signs or symptoms? Symptoms of this condition include eyes that are:  Itchy.  Red.  Watery.  Puffy. Your eyes may also:  Sting or burn.  Have clear drainage coming from them. How is this diagnosed? This condition may be diagnosed by medical history and physical exam. If you have drainage from your eyes, it may be tested to rule out other causes of conjunctivitis. You may also need to see a health care provider who specializes in treating allergies (allergist) or eye conditions (ophthalmologist) for tests to confirm the diagnosis. You may have:  Skin tests to see which allergens are causing your symptoms. These tests involve pricking the skin with a tiny needle and exposing the skin to small amounts of potential allergens to see if your skin reacts.  Blood tests.  Tissue scrapings from your eyelid. These will be examined under a microscope. How is this treated? Treatments for this condition may include:  Cold cloths (compresses) to soothe itching and swelling.  Washing the face to remove allergens.  Eye drops. These may be prescription or over-the-counter. There are several different types. You may need to try different types to see which one works best for you. Your may need:  Eye drops that block the allergic reaction  (antihistamine).  Eye drops that reduce swelling and irritation (anti-inflammatory).  Steroid eye drops to lessen a severe reaction (vernal conjunctivitis).  Oral antihistamine medicines to reduce your allergic reaction. You may need these if eye drops do not help or are difficult to use. Follow these instructions at home:  Avoid known allergens whenever possible.  Take or apply over-the-counter and prescription medicines only as told by your health care provider. These include any eye drops.  Apply a cool, clean washcloth to your eye for 10-20 minutes, 3-4 times a day.  Do not touch or rub your eyes.  Do not wear contact lenses until the inflammation is gone. Wear glasses instead.  Do not wear eye makeup until the inflammation is gone.  Keep all follow-up visits as  told by your health care provider. This is important. Contact a health care provider if:  Your symptoms get worse or do not improve with treatment.  You have mild eye pain.  You have sensitivity to light.  You have spots or blisters on your eyes.  You have pus draining from your eye.  You have a fever. Get help right away if:  You have redness, swelling, or other symptoms in only one eye.  Your vision is blurred or you have vision changes.  You have severe eye pain. This information is not intended to replace advice given to you by your health care provider. Make sure you discuss any questions you have with your health care provider. Document Released: 08/20/2002 Document Revised: 01/27/2016 Document Reviewed: 12/11/2015 Elsevier Interactive Patient Education  2017 [place].  Bacterial Conjunctivitis Bacterial conjunctivitis is an infection of the clear membrane that covers the white part of your eye and the inner surface of your eyelid (conjunctiva). When the blood vessels in your conjunctiva become inflamed, your eye becomes red or pink, and it will probably feel itchy. Bacterial conjunctivitis  spreads very easily from person to person (is contagious). It also spreads easily from one eye to the other eye. What are the causes? This condition is caused by several common bacteria. You may get the infection if you come into close contact with another person who is infected. You may also come into contact with items that are contaminated with the bacteria, such as a face towel, contact lens solution, or eye makeup. What increases the risk? This condition is more likely to develop in people who:  Are exposed to other people who have the infection.  Wear contact lenses.  Have a sinus infection.  Have had a recent eye injury or surgery.  Have a weak body defense system (immune system).  Have a medical condition that causes dry eyes. What are the signs or symptoms? Symptoms of this condition include:  Eye redness.  Tearing or watery eyes.  Itchy eyes.  Burning feeling in your eyes.  Thick, yellowish discharge from an eye. This may turn into a crust on the eyelid overnight and cause your eyelids to stick together.  Swollen eyelids.  Blurred vision. How is this diagnosed? Your health care provider can diagnose this condition based on your symptoms and medical history. Your health care provider may also take a sample of discharge from your eye to find the cause of your infection. This is rarely done. How is this treated? Treatment for this condition includes:  Antibiotic eye drops or ointment to clear the infection more quickly and prevent the spread of infection to others.  Oral antibiotic medicines to treat infections that do not respond to drops or ointments, or last longer than 10 days.  Cool, wet cloths (cool compresses) placed on the eyes.  Artificial tears applied 2-6 times a day. Follow these instructions at home: Medicines   Take or apply your antibiotic medicine as told by your health care provider. Do not stop taking or applying the antibiotic even if you  start to feel better.  Take or apply over-the-counter and prescription medicines only as told by your health care provider.  Be very careful to avoid touching the edge of your eyelid with the eye drop bottle or the ointment tube when you apply medicines to the affected eye. This will keep you from spreading the infection to your other eye or to other people. Managing discomfort   Gently wipe away  any drainage from your eye with a warm, wet washcloth or a cotton ball.  Apply a cool, clean washcloth to your eye for 10-20 minutes, 3-4 times a day. General instructions   Do not wear contact lenses until the inflammation is gone and your health care provider says it is safe to wear them again. Ask your health care provider how to sterilize or replace your contact lenses before you use them again. Wear glasses until you can resume wearing contacts.  Avoid wearing eye makeup until the inflammation is gone. Throw away any old eye cosmetics that may be contaminated.  Change or wash your pillowcase every day.  Do not share towels or washcloths. This may spread the infection.  Wash your hands often with soap and water. Use paper towels to dry your hands.  Avoid touching or rubbing your eyes.  Do not drive or use heavy machinery if your vision is blurred. Contact a health care provider if:  You have a fever.  Your symptoms do not get better after 10 days. Get help right away if:  You have a fever and your symptoms suddenly get worse.  You have severe pain when you move your eye.  You have facial pain, redness, or swelling.  You have sudden loss of vision. This information is not intended to replace advice given to you by your health care provider. Make sure you discuss any questions you have with your health care provider. Document Released: 05/30/2005 Document Revised: 10/08/2015 Document Reviewed: 03/12/2015 Elsevier Interactive Patient Education  2017 [place].  Viral  Conjunctivitis, Adult Viral conjunctivitis is an inflammation of the clear membrane that covers the white part of your eye and the inner surface of your eyelid (conjunctiva). The inflammation is caused by a viral infection. The blood vessels in the conjunctiva become inflamed, causing the eye to become red or pink, and often itchy. Viral conjunctivitis can be easily passed from one person to another (is contagious). This condition is often called pink eye. What are the causes? This condition is caused by a virus. A virus is a type of contagious germ. It can be spread by touching objects that have been contaminated with the virus, such as doorknobs or towels. It can also be passed through droplets, such as from coughing or sneezing. What are the signs or symptoms? Symptoms of this condition include:  Eye redness.  Tearing or watery eyes.  Itchy and irritated eyes.  Burning feeling in the eyes.  Clear drainage from the eye.  Swollen eyelids.  A gritty feeling in the eye.  Light sensitivity. This condition often occurs with other symptoms, such as a fever, nausea, or a rash. How is this diagnosed? This condition is diagnosed with a medical history and physical exam. If you have discharge from your eye, the discharge may be tested to rule out other causes of conjunctivitis. How is this treated? Viral conjunctivitis does not respond to medicines that kill bacteria (antibiotics). Treatment for viral conjunctivitis is directed at stopping a bacterial infection from developing in addition to the viral infection. Treatment also aims to relieve your symptoms, such as itching. This may be done with antihistamine drops or other eye medicines. Rarely, steroid eye drops or antiviral medicines may be prescribed. Follow these instructions at home: Medicines    Take or apply over-the-counter and prescription medicines only as told by your health care provider.  Be very careful to avoid touching the  edge of the eyelid with the eye drop bottle  or ointment tube when applying medicines to the affected eye. Being careful this way will stop you from spreading the infection to the other eye or to other people. Eye care   Avoid touching or rubbing your eyes.  Apply a warm, wet, clean washcloth to your eye for 10-20 minutes, 3-4 times per day or as told by your health care provider.  If you wear contact lenses, do not wear them until the inflammation is gone and your health care provider says it is safe to wear them again. Ask your health care provider how to sterilize or replace your contact lenses before using them again. Wear glasses until you can resume wearing contacts.  Avoid wearing eye makeup until the inflammation is gone. Throw away any old eye cosmetics that may be contaminated.  Gently wipe away any drainage from your eye with a warm, wet washcloth or a cotton ball. General instructions   Change or wash your pillowcase every day or as told by your health care provider.  Do not share towels, pillowcases, washcloths, eye makeup, makeup brushes, contact lenses, or glasses. This may spread the infection.  Wash your hands often with soap and water. Use paper towels to dry your hands. If soap and water are not available, use hand sanitizer.  Try to avoid contact with other people for one week or as told by your health care provider. Contact a health care provider if:  Your symptoms do not improve with treatment or they get worse.  You have increased pain.  Your vision becomes blurry.  You have a fever.  You have facial pain, redness, or swelling.  You have yellow or green drainage coming from your eye.  You have new symptoms. This information is not intended to replace advice given to you by your health care provider. Make sure you discuss any questions you have with your health care provider. Document Released: 08/20/2002 Document Revised: 12/26/2015 Document Reviewed:  12/15/2015 Elsevier Interactive Patient Education  2017 [employer].

## 2016-10-11 NOTE — Progress Notes (Signed)
Subjective:    Patient ID: Bruce Kemp, male    DOB: 1986-05-03, 31 y.o.   MRN: [id number]  31y/o single caucasian male seen recently in clinic for similar sx that he reports have worsened since last week.  Influenza like symptoms last week. Throat more sore and scratchy and feels swollen per patient. Persistant cough, more productive now especially at night Eyes are irritated, itchy, red, with yellow drainage that crusts overnight. Using Flonase 1 spray each nostril twice a day and Nyquil po prn at bedtime. Did not take Tamiflu as body aches fever did not persist past 24 hours. Still on etodolac also denied fever. Symptoms worse when outside or windows open at home this weekend sinus congestion/pressure/discharge improved when inside/after shower/saline.  Nausea resolved.  Denied teeth pain, hemoptysis, rash, diarrhea, SOB, wheezing, headache, myalgias, DOE, swelling hands/feet, and/or ear discharge.       Review of Systems  Constitutional: Negative for activity change, appetite change, chills, diaphoresis, fatigue, fever and unexpected weight change.  HENT: Positive for congestion, postnasal drip, rhinorrhea, sinus pain, sinus pressure and sore throat. Negative for dental problem, drooling, ear discharge, ear pain, facial swelling, hearing loss, mouth sores, nosebleeds, sneezing, tinnitus, trouble swallowing and voice change.   Eyes: Positive for discharge, redness and itching. Negative for photophobia, pain and visual disturbance.  Respiratory: Positive for cough. Negative for choking, chest tightness, shortness of breath, wheezing and stridor.   Cardiovascular: Negative for chest pain, palpitations and leg swelling.  Gastrointestinal: Negative for abdominal distention, abdominal pain, blood in stool, constipation, diarrhea, nausea and vomiting.  Endocrine: Negative for cold intolerance and heat intolerance.  Genitourinary: Negative for dysuria.  Musculoskeletal: Negative for arthralgias,  back pain, gait problem, joint swelling, myalgias, neck pain and neck stiffness.  Skin: Negative for color change, pallor, rash and wound.  Allergic/Immunologic: Positive for environmental allergies. Negative for food allergies and immunocompromised state.  Neurological: Negative for dizziness, tremors, seizures, syncope, facial asymmetry, speech difficulty, weakness, light-headedness, numbness and headaches.  Hematological: Negative for adenopathy. Does not bruise/bleed easily.  Psychiatric/Behavioral: Positive for sleep disturbance. Negative for agitation, behavioral problems and confusion.       Objective:   Physical Exam  Constitutional: He is oriented to person, place, and time. Vital signs are normal. He appears well-developed and well-nourished. He is active and cooperative.  Non-toxic appearance. He does not have a sickly appearance. He appears ill. No distress.  HENT:  Head: Normocephalic and atraumatic.  Right Ear: Hearing, external ear and ear canal normal. Tympanic membrane is injected, scarred, erythematous and bulging. A middle ear effusion is present.  Left Ear: Hearing, external ear and ear canal normal. Tympanic membrane is scarred and bulging. A middle ear effusion is present.  Nose: Mucosal edema and rhinorrhea present. No nose lacerations, sinus tenderness, nasal deformity, septal deviation or nasal septal hematoma. No epistaxis.  No foreign bodies. Right sinus exhibits no maxillary sinus tenderness and no frontal sinus tenderness. Left sinus exhibits no maxillary sinus tenderness and no frontal sinus tenderness.  Mouth/Throat: Uvula is midline. Mucous membranes are not pale, not dry and not cyanotic. He does not have dentures. Oral lesions present. No trismus in the jaw. Normal dentition. No dental abscesses, uvula swelling, lacerations or dental caries. Posterior oropharyngeal edema and posterior oropharyngeal erythema present. No oropharyngeal exudate or tonsillar abscesses.   Maculopapular rash erythema oropharynx/hard palate; cobblestoning posterior pharynx; bilateral allergic shiners; bilateral TMs air fluid level clear bulging and scarring; left TM injection/erythema 10% centrally; bilateral nasal  turbinates edema/erythema clear discharge  Eyes: EOM and lids are normal. Pupils are equal, round, and reactive to light. Right eye exhibits no chemosis, no discharge, no exudate and no hordeolum. No foreign body present in the right eye. Left eye exhibits no chemosis, no discharge, no exudate and no hordeolum. No foreign body present in the left eye. Right conjunctiva is injected. Right conjunctiva has no hemorrhage. Left conjunctiva is injected. Left conjunctiva has no hemorrhage. No scleral icterus. Right eye exhibits normal extraocular motion and no nystagmus. Left eye exhibits normal extraocular motion and no nystagmus. Right pupil is round and reactive. Left pupil is round and reactive. Pupils are equal.  Left greater than right eye bulbar conjunctiva injection left 3+/4 right 2+/4; left vasculature inflammation bulbar also near vision 20/20 uncorrected  Neck: Trachea normal and normal range of motion. Neck supple. No tracheal tenderness, no spinous process tenderness and no muscular tenderness present. No neck rigidity. No tracheal deviation, no edema, no erythema and normal range of motion present. No thyroid mass and no thyromegaly present.  Cardiovascular: Normal rate, regular rhythm, S1 normal, S2 normal, normal heart sounds, intact distal pulses and normal pulses.  PMI is not displaced.  Exam reveals no gallop and no friction rub.   No murmur heard. Pulses:      Radial pulses are 2+ on the right side, and 2+ on the left side.  Pulmonary/Chest: Effort normal and breath sounds normal. No stridor. No respiratory distress. He has no decreased breath sounds. He has no wheezes. He has no rhonchi. He has no rales.  No cough observed in exam room; speaks full sentences without  difficulty  Abdominal: Soft. He exhibits no distension.  Musculoskeletal: Normal range of motion. He exhibits no edema, tenderness or deformity.       Right shoulder: Normal.       Left shoulder: Normal.       Right elbow: Normal.      Left elbow: Normal.       Right hip: Normal.       Left hip: Normal.       Right knee: Normal.       Left knee: Normal.       Right ankle: Normal.       Left ankle: Normal.       Cervical back: Normal.       Thoracic back: Normal.       Lumbar back: Normal.       Right hand: Normal.       Left hand: Normal.  Lymphadenopathy:       Head (right side): No submental, no submandibular, no tonsillar, no preauricular, no posterior auricular and no occipital adenopathy present.       Head (left side): No submental, no submandibular, no tonsillar, no preauricular, no posterior auricular and no occipital adenopathy present.    He has no cervical adenopathy.       Right cervical: No superficial cervical, no deep cervical and no posterior cervical adenopathy present.      Left cervical: No superficial cervical, no deep cervical and no posterior cervical adenopathy present.  Neurological: He is alert and oriented to person, place, and time. He is not disoriented. He displays no atrophy and no tremor. No cranial nerve deficit or sensory deficit. He exhibits normal muscle tone. He displays no seizure activity. Coordination and gait normal. GCS eye subscore is 4. GCS verbal subscore is 5. GCS motor subscore is 6.  Bilateral hand grasp equal  5/5; on/off exam table and in/out chair without difficulty gait sure and steady in hall  Skin: Skin is warm and intact. No abrasion, no bruising, no burn, no ecchymosis, no laceration, no lesion, no petechiae and no rash noted. He is diaphoretic. No cyanosis or erythema. No pallor. Nails show no clubbing.  Psychiatric: He has a normal mood and affect. His speech is normal and behavior is normal. Judgment and thought content normal.  Cognition and memory are normal.  Nursing note and vitals reviewed.         Assessment & Plan:  A-acute pharyngitis/bronchitis; bilateral otitis media; allergic conjunctivitis/rhinitis seasonal, acute bronchitis, otitis media effusion bilaterally  P-start zyrtec 10mg  po daily generic OTC, start augmentin 875mg  po BID x 10 days #20 RF0 dispensed from [facility].  Has flonase and nasal saline at home and etodolac for pain po BID.  Continue shower BID and honey with lemon and nyquil/cough drop po q2h prn cough.  Rx albuterol 1-2 puffs po q4-6h prn wheezing/protracted coughing #1 RF0start OTC antihistamine (zyrtec/claritin 10mg  by mouth daily) Continue etodolac twice a day by mouth, flonase 1 spray each nostril twice a day and nasal saline in the shower Shower twice a day Do not sleep with the windows open Honey with lemon Hydrate nyquil 1 teaspoon at bedtime as needed for cough per manufacturer's instructions If wheezing/protracted coughing start albuterol inhaler 1-2 puffs by mouth every 4-6 hours and follow up for re-evaluation if you have been taking augmentin 875mg  by mouth every 12 hours for 48 hours already (take augmentin for 10 days)  Given Rx for albuterol and augmentin today.  Augmentin dispensed from [facility] to patient.  Refused oral steroids and prescription cough suppressants today.  Bronchitis simple, community acquired, may have started as viral (probably respiratory syncytial, parainfluenza, influenza, or adenovirus), but now evidence of acute purulent bronchitis with resultant bronchial edema and mucus formation.  Viruses are the most common cause of bronchial inflammation in otherwise healthy adults with acute bronchitis.  The appearance of sputum is not predictive of whether a bacterial infection is present.  Purulent sputum is most often caused by viral infections.  There are a small portion of those caused by non-viral agents being Mycoplamsa pneumonia.  Microscopic examination or C&S of  sputum in the healthy adult with acute bronchitis is generally not helpful (usually negative or normal respiratory flora) other considerations being cough from upper respiratory tract infections, sinusitis or allergic syndromes (mild asthma or viral pneumonia).  Differential Diagnosis:  reactive airway disease (asthma, allergic aspergillosis (eosinophilia), chronic bronchitis, respiratory infection (Sinusitis, Common cold, pneumonia), congestive heart failure, reflux esophagitis, bronchogenic tumor, aspiration syndromes and/or exposure irritants/tobacco smoke.  In this case, there is no evidence of any invasive bacterial illness.  Most likely viral etiology so will hold on antibiotic treatment.  Advise supportive care with rest, encourage fluids, good hygiene and watch for any worsening symptoms.  If they were to develop:  come back to the office or go to the emergency room if after hours. Without high fever, severe dyspnea, lack of physical findings or other risk factors, I will hold on a chest radiograph and CBC at this time. I discussed that approximately 50% of patients with acute bronchitis have a cough that lasts up to three weeks, and 25% for over a month.  Tylenol, one to two tablets every four hours as needed for fever or myalgias.   No aspirin.  Patient instructed to follow up in one week or sooner if symptoms worsen.  Patient verbalized agreement and understanding of treatment plan.  P2:  hand washing and cover cough  Rapid strep and throat cultures not available at this location.  Patient with maculopapular erythematous rash oropharynx today worsening symptoms will start augmentin 875mg  po BID x 10 days #20 RF0 dispensed from [facility].  On etodolac po BID for MSK condition.  Usually no specific medical treatment is needed if a virus is causing the sore throat.  The throat most often gets better on its own within 5 to 7 days.  Antibiotic medicine does not cure viral pharyngitis.   For acute pharyngitis  caused by bacteria, your healthcare provider will prescribe an antibiotic.  Marland Kitchen Do not smoke.  Marland Kitchen Avoid secondhand smoke and other air pollutants.  . Use a cool mist humidifier to add moisture to the air.  . Get plenty of rest.  . You may want to rest your throat by talking less and eating a diet that is mostly liquid or soft for a day or two.   Marland Kitchen Nonprescription throat lozenges and mouthwashes should help relieve the soreness.   . Gargling with warm saltwater and drinking warm liquids may help.  (You can make a saltwater solution by adding 1/4 teaspoon of salt to 8 ounces, or 240 mL, of warm water.)  . A nonprescription pain reliever such as acetaminophen 1000mg  by mouth every 6 hours may ease general aches and pains.   FOLLOW UP with clinic provider if no improvements in the next 7-10 days.  Patient verbalized understanding of instructions and agreed with plan of care. P2:  Hand washing and diet.   Start refresh plus 2 drops each eye three times per day as needed for irritation/dryness due to any cause Clean linens/pillowcase more frequently if discharge on them Avoid rubbing eyes, wash hands frequently Erythromycin opthalmic ointment 1cm ribbon into bilateral eyes at least every 12 hours x 1 week Ketotifen 1 drop each eye twice a day for 30 days as needed for itching/irritation due to seasonal allergies Cleared for work  Start antihistamine medication.  Shower after allergen exposure prior to bed.  Hygiene discussed. Patient to apply warm packs prn as directed.  Instructed patient to not rub eyes.  May use over the counter eye drops/tears for pain/symptom relief.  Return to clinic if headache, fever, greater than 100.41F, nausea/vomiting, purulent discharge/matting unable to open eye without using fingers, foreign body sensation, ciliary flush, photophobia or vision changes.  Call or return to clinic as needed if these symptoms worsen or fail to improve as anticipated.  Patient given [facility]  handout on allergic conjunctivitis, viral conjunctivitis and bacterial conjunctivitis.  Patient verbalized agreement and understanding of treatment plan.   P2:  Hand washing, avoid contact use-wear glasses.  Follow up re-evaluation if shortness of breath, coughing up blood, visual changes, headaches, fever greater than 100.5 and/or no improvement with plan of care x 48 hours  Patient may use normal saline nasal spray as needed.  Consider antihistamine and continue nasal steroid use.  Avoid triggers if possible.  Shower prior to bedtime if exposed to triggers.  If allergic dust/dust mites recommend mattress/pillow covers/encasements; washing linens, vacuuming, sweeping, dusting weekly.  Call or return to clinic as needed if these symptoms worsen or fail to improve as anticipated.   [facility] handout on allergic rhinitis given to patient.  Patient verbalized understanding of instructions, agreed with plan of care and had no further questions at this time.   Supportive treatment.   No evidence of  invasive bacterial infection, non toxic and well hydrated.  This is most likely self limiting viral infection.  I do not see where any further testing or imaging is necessary at this time.   I will suggest supportive care, rest, good hygiene and encourage the patient to take adequate fluids.  The patient is to return to clinic or EMERGENCY ROOM if symptoms worsen or change significantly e.g. ear pain, fever, purulent discharge from ears or bleeding.  [facility] handout on otitis media with effusion given to patient.  Patient verbalized agreement and understanding of treatment plan.

## 2016-11-01 ENCOUNTER — Other Ambulatory Visit: Payer: Self-pay | Admitting: Family Medicine

## 2016-11-23 ENCOUNTER — Other Ambulatory Visit: Payer: Self-pay | Admitting: Family Medicine

## 2016-12-20 ENCOUNTER — Telehealth: Payer: Self-pay | Admitting: Registered Nurse

## 2016-12-20 ENCOUNTER — Encounter: Payer: Self-pay | Admitting: Registered Nurse

## 2016-12-20 DIAGNOSIS — M461 Sacroiliitis, not elsewhere classified: Secondary | ICD-10-CM | POA: Insufficient documentation

## 2016-12-20 MED ORDER — ETODOLAC 500 MG PO TABS: 500.0000 mg | ORAL_TABLET | Freq: Two times a day (BID) | ORAL | 3 refills | Status: DC

## 2016-12-20 NOTE — Telephone Encounter (Signed)
Patient requested refill etodolac last filled Jan 2018 500mg  po BID prn #180 RF3.  Take with food.  Follow up if new or worsening pain.

## 2017-01-10 ENCOUNTER — Ambulatory Visit: Payer: Self-pay | Admitting: Registered Nurse

## 2017-01-10 VITALS — BP 137/95 | HR 62 | Temp 98.5°F

## 2017-01-10 DIAGNOSIS — B880 Other acariasis: Secondary | ICD-10-CM

## 2017-01-10 NOTE — Progress Notes (Signed)
Subjective:    Patient ID: Bruce Kemp, male    DOB: Jul 03, 1985, 31 y.o.   MRN: [id number]  30y/o single caucasian male established patient c/o rash to bil feet, legs, groin and torso. Was outdoors on Saturday mowing in tall grass flip flops and shorts, rash appeared Sunday starting on lower legs/feet but has been spreading upwards since then. Rash itchy, nonpainful. Denied drainage/pus/fever/lesions between toes/fingers, tick bite/removal.  Could have been mosquito bites but thinks it was chiggers. Has tried calamine, hydrocortisone, technu gel helps itching for a little bit but then itching returns.  Taking benadryl at bedtime cannot take during the day due to drowsiness.      Review of Systems  Constitutional: Negative for activity change, appetite change, chills, diaphoresis, fatigue and fever.  HENT: Negative for congestion, ear pain and sore throat.   Eyes: Negative for pain and discharge.  Respiratory: Negative for cough and wheezing.   Cardiovascular: Negative for chest pain and leg swelling.  Gastrointestinal: Negative for blood in stool, constipation, diarrhea, nausea and vomiting.  Genitourinary: Negative for difficulty urinating, dysuria and hematuria.  Musculoskeletal: Negative for arthralgias, back pain, gait problem, joint swelling, myalgias, neck pain and neck stiffness.  Skin: Positive for color change and rash. Negative for pallor and wound.  Allergic/Immunologic: Positive for environmental allergies. Negative for food allergies.  Neurological: Negative for dizziness, tremors, seizures, syncope, facial asymmetry, speech difficulty, weakness, light-headedness, numbness and headaches.  Hematological: Negative for adenopathy. Does not bruise/bleed easily.  Psychiatric/Behavioral: Negative for agitation, confusion and sleep disturbance. The patient is not nervous/anxious.        Objective:   Physical Exam  Constitutional: He is oriented to person, place, and time. He  appears well-developed and well-nourished. He is active and cooperative.  Non-toxic appearance. He does not have a sickly appearance. He does not appear ill. No distress.  HENT:  Head: Normocephalic and atraumatic.  Right Ear: Hearing and external ear normal.  Left Ear: Hearing and external ear normal.  Nose: Nose normal.  Mouth/Throat: Uvula is midline, oropharynx is clear and moist and mucous membranes are normal. No oropharyngeal exudate.  Eyes: Pupils are equal, round, and reactive to light. Conjunctivae, EOM and lids are normal. Right eye exhibits no discharge. Left eye exhibits no discharge. No scleral icterus.  Neck: Trachea normal, normal range of motion and phonation normal. Neck supple. No tracheal tenderness and no muscular tenderness present. No neck rigidity. No tracheal deviation, no edema, no erythema and normal range of motion present. No thyromegaly present.  Cardiovascular: Normal rate, regular rhythm, normal heart sounds and intact distal pulses.   Pulses:      Radial pulses are 2+ on the right side, and 2+ on the left side.  Pulmonary/Chest: Effort normal and breath sounds normal. No stridor. No respiratory distress. He has no wheezes.  Abdominal: Soft. He exhibits no distension. There is no guarding.  Musculoskeletal: Normal range of motion. He exhibits no edema, tenderness or deformity.       Right shoulder: Normal.       Left shoulder: Normal.       Right elbow: Normal.      Left elbow: Normal.       Right hip: Normal.       Left hip: Normal.       Right knee: Normal.       Left knee: Normal.       Right ankle: Normal.       Left ankle: Normal.  Cervical back: Normal.       Thoracic back: Normal.       Lumbar back: Normal.       Right hand: Normal.       Left hand: Normal.  Lymphadenopathy:       Head (right side): No submental, no submandibular, no tonsillar, no preauricular, no posterior auricular and no occipital adenopathy present.       Head (left  side): No submental, no submandibular, no tonsillar, no preauricular, no posterior auricular and no occipital adenopathy present.    He has no cervical adenopathy.       Right cervical: No superficial cervical, no deep cervical and no posterior cervical adenopathy present.      Left cervical: No superficial cervical, no deep cervical and no posterior cervical adenopathy present.  Neurological: He is alert and oriented to person, place, and time. He is not disoriented. He displays no atrophy, no tremor and normal reflexes. No cranial nerve deficit or sensory deficit. He exhibits normal muscle tone. He displays no seizure activity. Coordination and gait normal. GCS eye subscore is 4. GCS verbal subscore is 5. GCS motor subscore is 6.  On/off exam table without difficulty; in/out of chair smooth; gait sure and steady in hall wearing flip flops  Skin: Skin is warm, dry and intact. Rash noted. No abrasion, no bruising, no burn, no ecchymosis, no laceration, no lesion, no petechiae and no purpura noted. Rash is papular, maculopapular and vesicular. Rash is not macular, not nodular, not pustular and not urticarial. He is not diaphoretic. There is erythema. No cyanosis. No pallor. Nails show no clubbing.     Scattered papules and vesicles on erythematous base largest number feet and lower legs and hips bilaterally; scant chest/back/arms; no lesions hands, face/neck  Psychiatric: He has a normal mood and affect. His speech is normal and behavior is normal. Judgment and thought content normal. Cognition and memory are normal.  Nursing note and vitals reviewed.         Assessment & Plan:  A-insect bites and pruritis  P-Topical hydrocortisone 1% apply BID prn itching along with technu gel TID prn itching (given 6 UD of each from clinic stock).  Benadryl 50mg  po qhs prn itching.  Ice prn topical affected areas.  If no relief will Rx atarax 25mg  po q8h prn itching #21 RF0  If worsening vesicles will start  prednisone taper also.  No infected areas at this time.  Discussed histamine release causing itching.Medication as directed. Avoid scratching.  Monitor for signs/symptoms infection e.g. Fever, purulent discharge, muscle/joint aches, headache.   Symptomatic therapy suggested.  Warm to cool water soaks and/or oatmeal baths.  Call or return to clinic as needed if these symptoms worsen or fail to improve as anticipated.  [facility] handout on insect bites and scabies given to patient.  Patient verbalized agreement and understanding of treatment plan.   P2:  Avoidance and hand washing.

## 2017-01-11 NOTE — Patient Instructions (Addendum)
Scabies, Adult (FYI different from chiggers) Scabies is a skin condition that happens when very small insects get under the skin (infestation). This causes a rash and severe itchiness. Scabies can spread from person to person (is contagious). If you get scabies, it is common for others in your household to get scabies too. With proper treatment, symptoms usually go away in 2-4 weeks. Scabies usually does not cause lasting problems. What are the causes? This condition is caused by mites (Sarcoptes scabiei, or human itch mites) that can only be seen with a microscope. The mites get into the top layer of skin and lay eggs. Scabies can spread from person to person through:  Close contact with a person who has scabies.  Contact with infested items, such as towels, bedding, or clothing.  What increases the risk? This condition is more likely to develop in:  People who live in nursing homes and other extended-care facilities.  People who have sexual contact with a partner who has scabies.  Young children who attend child care facilities.  People who care for others who are at increased risk for scabies.  What are the signs or symptoms? Symptoms of this condition may include:  Severe itchiness. This is often worse at night.  A rash that includes tiny red bumps or blisters. The rash commonly occurs on the wrist, elbow, armpit, fingers, waist, groin, or buttocks. Bumps may form a line (burrow) in some areas.  Skin irritation. This can include scaly patches or sores.  How is this diagnosed? This condition is diagnosed with a physical exam. Your health care provider will look closely at your skin. In some cases, your health care provider may take a sample of your affected skin (skin scraping) and have it examined under a microscope. How is this treated? This condition may be treated with:  Medicated cream or lotion that kills the mites. This is spread on the entire body and left on for several  hours. Usually, one treatment with medicated cream or lotion is enough to kill all of the mites. In severe cases, the treatment may be repeated.  Medicated cream that relieves itching.  Medicines that help to relieve itching.  Medicines that kill the mites. This treatment is rarely used.  Follow these instructions at home:  Medicines  Take or apply over-the-counter and prescription medicines as told by your health care provider.  Apply medicated cream or lotion as told by your health care provider.  Do not wash off the medicated cream or lotion until the necessary amount of time has passed. Skin Care  Avoid scratching your affected skin.  Keep your fingernails closely trimmed to reduce injury from scratching.  Take cool baths or apply cool washcloths to help reduce itching. General instructions  Clean all items that you recently had contact with, including bedding, clothing, and furniture. Do this on the same day that your treatment starts. ? Use hot water when you wash items. ? Place unwashable items into closed, airtight plastic bags for at least 3 days. The mites cannot live for more than 3 days away from human skin. ? Vacuum furniture and mattresses that you use.  Make sure that other people who may have been infested are examined by a health care provider. These include members of your household and anyone who may have had contact with infested items.  Keep all follow-up visits as told by your health care provider. This is important. Contact a health care provider if:  You have itching that  does not go away after 4 weeks of treatment.  You continue to develop new bumps or burrows.  You have redness, swelling, or pain in your rash area after treatment.  You have fluid, blood, or pus coming from your rash. This information is not intended to replace advice given to you by your health care provider. Make sure you discuss any questions you have with your health care  provider. Document Released: 02/18/2015 Document Revised: 11/05/2015 Document Reviewed: 12/30/2014 Elsevier Interactive Patient Education  2018 [place], Adult An insect bite can make your skin red, itchy, and swollen. An insect bite is different from an insect sting, which happens when an insect injects poison (venom) into the skin. Some insects can spread disease to people through a bite. However, most insect bites do not lead to disease and are not serious. What are the causes? Insects may bite for a variety of reasons, including:  Hunger.  To defend themselves.  Insects that bite include:  Spiders.  Mosquitoes.  Ticks.  Fleas.  Ants.  Flies.  Bedbugs.  What are the signs or symptoms? Symptoms of this condition include:  Itching or pain in the bite area.  Redness and swelling in the bite area.  An open wound (skin ulcer).  In many cases, symptoms last for 2-4 days. How is this diagnosed? This condition is usually diagnosed based on symptoms and a physical exam. How is this treated? Treatment is usually not needed. Symptoms often go away on their own. When treatment is recommended, it may involve:  Applying a cream or lotion to the bitten area. This treatment helps with itching.  Taking an antibiotic medicine. This treatment is needed if the bite area gets infected.  Getting a tetanus shot.  Applying ice to the affected area.  Medicines called antihistamines. This treatment is needed if you develop an allergic reaction to the insect bite.  Follow these instructions at home: Bite area care  Do not scratch the bite area.  Keep the bite area clean and dry. Wash it every day with soap and water as told by your health care provider.  Check the bite area every day for signs of infection. Check for: ? More redness, swelling, or pain. ? Fluid or blood. ? Warmth. ? Pus. Managing pain, itching, and swelling   You may apply a baking  soda paste, cortisone cream, or calamine lotion to the bite area as told by your health care provider.  If directed, applyice to the bite area. ? Put ice in a plastic bag. ? Place a towel between your skin and the bag. ? Leave the ice on for 20 minutes, 2-3 times per day. Medicines  Apply or take over-the-counter and prescription medicines only as told by your health care provider.  If you were prescribed an antibiotic medicine, use it as told by your health care provider. Do not stop using the antibiotic even if your condition improves. General instructions  Keep all follow-up visits as told by your health care provider. This is important. How is this prevented? To help reduce your risk of insect bites:  When you are outdoors, wear clothing that covers your arms and legs.  Use insect repellent. The best insect repellents contain: ? DEET, picaridin, oil of lemon eucalyptus (OLE), or IR3535. ? Higher amounts of an active ingredient.  If your home windows do not have screens, consider installing them.  Contact a health care provider if:  You have more redness, swelling,  or pain in the bite area.  You have fluid, blood, or pus coming from the bite area.  The bite area feels warm to the touch.  You have a fever. Get help right away if:  You have joint pain.  You have a rash.  You have shortness of breath.  You feel unusually tired or sleepy.  You have neck pain.  You have a headache.  You have unusual weakness.  You have chest pain.  You have nausea, vomiting, or pain in the abdomen. This information is not intended to replace advice given to you by your health care provider. Make sure you discuss any questions you have with your health care provider. Document Released: 07/07/2004 Document Revised: 01/27/2016 Document Reviewed: 12/07/2015 Elsevier Interactive Patient Education  [employer].

## 2017-06-05 ENCOUNTER — Encounter: Payer: Self-pay | Admitting: Family Medicine

## 2017-06-05 ENCOUNTER — Ambulatory Visit (INDEPENDENT_AMBULATORY_CARE_PROVIDER_SITE_OTHER): Payer: No Typology Code available for payment source | Admitting: Family Medicine

## 2017-06-05 ENCOUNTER — Other Ambulatory Visit: Payer: Self-pay

## 2017-06-05 VITALS — BP 144/88 | HR 76 | Temp 97.9°F | Resp 16 | Ht 72.44 in | Wt 251.0 lb

## 2017-06-05 DIAGNOSIS — F411 Generalized anxiety disorder: Secondary | ICD-10-CM | POA: Diagnosis not present

## 2017-06-05 MED ORDER — ESCITALOPRAM OXALATE 10 MG PO TABS: 10.0000 mg | ORAL_TABLET | Freq: Every day | ORAL | 5 refills | Status: DC

## 2017-06-05 MED ORDER — ALPRAZOLAM 0.5 MG PO TABS: 0.5000 mg | ORAL_TABLET | Freq: Three times a day (TID) | ORAL | 0 refills | Status: DC | PRN

## 2017-06-05 NOTE — Progress Notes (Signed)
Subjective:    Patient ID: Bruce Kemp, male    DOB: 1985/11/06, 31 y.o.   MRN: [id number]  HPI Is a very pleasant 31 year old Caucasian male here today to establish care and to discuss anxiety. Patient has been dealing with anxiety his entire life. Patient will have frequent panic attacks. In fact several of his panic attacks have led to vasovagal reactions and syncope. He has been dealing with these off and on as far back as age 31. They usually occur 2-3 times a week. He is tried cognitive behavioral therapy and relaxation techniques with very little benefit. Most nights he stares at the ceiling unable to sleep. He now has a very high pressure job in [employer] and he has constant anxiety about losing his job or not doing a good enough job. He reports weight loss due to decreased appetite stemming from anxiety. He reports poor concentration because of anxiety. He even has some symptoms of depression although he denies suicidal ideation or anhedonia or major depression itself. Patient is interested in medication options to help manage and prevent the anxiety rather than just cover up the anxiety such as using sedatives on a when necessary basis Past Medical History:  Diagnosis Date  . Anxiety   . GERD (gastroesophageal reflux disease)    History reviewed. No pertinent surgical history. Current Outpatient Medications on File Prior to Visit  Medication Sig Dispense Refill  . etodolac (LODINE) 500 MG tablet Take 1 tablet (500 mg total) by mouth 2 (two) times daily. 180 tablet 3   No current facility-administered medications on file prior to visit.    No Known Allergies Social History   Socioeconomic History  . Marital status: Single    Spouse name: Not on file  . Number of children: Not on file  . Years of education: Not on file  . Highest education level: Not on file  Social Needs  . Financial resource strain: Not on file  . Food insecurity - worry: Not on file  . Food insecurity -  inability: Not on file  . Transportation needs - medical: Not on file  . Transportation needs - non-medical: Not on file  Occupational History  . Occupation: Engineer, manufacturing: [employer]  Tobacco Use  . Smoking status: Current Some Day Smoker    Types: Cigarettes  . Smokeless tobacco: Never Used  Substance and Sexual Activity  . Alcohol use: Yes    Comment: socially  . Drug use: No  . Sexual activity: Yes    Birth control/protection: Pill  Other Topics Concern  . Not on file  Social History Narrative  . Not on file   Family History  Problem Relation Age of Onset  . Diabetes Father   . Hypertension Maternal Grandmother   . Diabetes Maternal Grandfather   . Hypertension Maternal Grandfather   . Hypertension Paternal Grandmother   . Hypertension Paternal Grandfather       Review of Systems  All other systems reviewed and are negative.      Objective:   Physical Exam  Constitutional: He is oriented to person, place, and time. He appears well-developed and well-nourished.  Cardiovascular: Normal rate, regular rhythm, normal heart sounds and intact distal pulses. Exam reveals no friction rub.  No murmur heard. Pulmonary/Chest: Effort normal and breath sounds normal. No respiratory distress. He has no wheezes. He has no rales.  Abdominal: Soft. Bowel sounds are normal. He exhibits no distension. There is no tenderness. There  is no rebound.  Neurological: He is alert and oriented to person, place, and time. No cranial nerve deficit. He exhibits normal muscle tone. Coordination normal.  Psychiatric: He has a normal mood and affect. His behavior is normal. Judgment and thought content normal.  Vitals reviewed.         Assessment & Plan:  GAD Patient has been suffering in dealing with anxiety off and on for more than a decade. Recently, over the last 6 months, the anxiety is become more problematic because of the stress of his job. His panic attacks are  becoming more frequent and more debilitating. Therefore I believe he has generalized anxiety disorder, located about panic attacks. I recommended Lexapro 10 mg a day as a preventative strategy to help manage anxiety. He can use Xanax 0.5 mg by mouth every 8 hours when necessary for breakthrough panic attacks on an as-needed basis. Hopefully as the Lexapro takes effect, we can wean the patient away from the Xanax. We discussed the best of abuse and addiction from benzodiazepines. He denies any alcohol or recreational drug use. Follow-up in 4-6 weeks or sooner if worse area blood pressures elevated today but the patient is anxious. I would like to recheck his blood pressure hopefully as his anxiety level improves.

## 2017-06-27 ENCOUNTER — Ambulatory Visit: Payer: PRIVATE HEALTH INSURANCE | Admitting: Urology

## 2017-06-27 ENCOUNTER — Encounter: Payer: Self-pay | Admitting: Urology

## 2017-06-27 VITALS — BP 123/82 | HR 60 | Ht 72.0 in | Wt 245.0 lb

## 2017-06-27 DIAGNOSIS — Z3009 Encounter for other general counseling and advice on contraception: Secondary | ICD-10-CM | POA: Diagnosis not present

## 2017-06-27 MED ORDER — DIAZEPAM 10 MG PO TABS: ORAL_TABLET | ORAL | 0 refills | Status: DC

## 2017-06-27 NOTE — Progress Notes (Signed)
06/27/2017 3:24 PM   Bruce Kemp 1985/08/19 [id number]  Referring provider: Susy Frizzle, MD [address] [place], [place] [address]  Chief Complaint  Patient presents with  . New Patient (Initial Visit)    HPI: Bruce Kemp is a 32 year old male who presents for vasectomy consultation.  He is not married and has no children however states he has given this considerable thought and desires vasectomy as a means of permanent sterilization.  There is no previous history of urologic problems and specifically chronic scrotal pain or epididymitis.  He has no previous history of genitourinary surgery.   PMH: Past Medical History:  Diagnosis Date  . Anxiety   . Asthma   . GERD (gastroesophageal reflux disease)     Surgical History: No past surgical history on file.  Home Medications:  Allergies as of 06/27/2017   No Known Allergies     Medication List        Accurate as of 06/27/17  3:24 PM. Always use your most recent med list.          ALPRAZolam 0.5 MG tablet Commonly known as:  XANAX Take 1 tablet (0.5 mg total) by mouth 3 (three) times daily as needed for anxiety.   escitalopram 10 MG tablet Commonly known as:  LEXAPRO Take 1 tablet (10 mg total) by mouth daily.   etodolac 500 MG tablet Commonly known as:  LODINE Take 1 tablet (500 mg total) by mouth 2 (two) times daily.       Allergies: No Known Allergies  Family History: Family History  Problem Relation Age of Onset  . Diabetes Father   . Hypertension Maternal Grandmother   . Diabetes Maternal Grandfather   . Hypertension Maternal Grandfather   . Hypertension Paternal Grandmother   . Hypertension Paternal Grandfather   . Kidney cancer Neg Hx   . Kidney disease Neg Hx   . Prostate cancer Neg Hx     Social History:  reports that he has been smoking cigarettes.  he has never used smokeless tobacco. He reports that he drinks alcohol. He reports that he does not use  drugs.  ROS: UROLOGY Frequent Urination?: No Hard to postpone urination?: No Burning/pain with urination?: No Get up at night to urinate?: No Leakage of urine?: No Urine stream starts and stops?: No Trouble starting stream?: No Do you have to strain to urinate?: No Blood in urine?: No Urinary tract infection?: No Sexually transmitted disease?: No Injury to kidneys or bladder?: No Painful intercourse?: No Weak stream?: No Erection problems?: No Penile pain?: No  Gastrointestinal Nausea?: No Vomiting?: No Indigestion/heartburn?: No Diarrhea?: No Constipation?: No  Constitutional Fever: No Night sweats?: No Weight loss?: No Fatigue?: No  Skin Skin rash/lesions?: No Itching?: No  Eyes Blurred vision?: No Double vision?: No  Ears/Nose/Throat Sore throat?: No Sinus problems?: No  Hematologic/Lymphatic Swollen glands?: No Easy bruising?: No  Cardiovascular Leg swelling?: No Chest pain?: No  Respiratory Cough?: No Shortness of breath?: No  Endocrine Excessive thirst?: No  Musculoskeletal Back pain?: No Joint pain?: No  Neurological Headaches?: No Dizziness?: No  Psychologic Depression?: No Anxiety?: No  Physical Exam: BP 123/82   Pulse 60   Ht 6' (1.829 m)   Wt 245 lb (111.1 kg)   BMI 33.23 kg/m   Constitutional:  Alert and oriented, No acute distress. HEENT: [place] AT, moist mucus membranes.  Trachea midline, no masses. Cardiovascular: No clubbing, cyanosis, or edema. Respiratory: Normal respiratory effort, no increased  work of breathing. GI: Abdomen is soft, nontender, nondistended, no abdominal masses GU: No CVA tenderness.  Penis circumcised without lesions.  Testes descended bilaterally without masses or tenderness.  Cord/epididymes palpably normal.  Vasa easily palpable bilaterally. Skin: No rashes, bruises or suspicious lesions. Lymph: No cervical or inguinal adenopathy. Neurologic: Grossly intact, no focal deficits, moving all 4  extremities. Psychiatric: Normal mood and affect.  Laboratory Data: Lab Results  Component Value Date   WBC 6.0 04/30/2015   HGB 15.6 04/30/2015   HCT 45.9 04/30/2015   MCV 89 04/30/2015   PLT 261 04/30/2015    Lab Results  Component Value Date   CREATININE 0.99 04/30/2015    Lab Results  Component Value Date   HGBA1C 5.6 04/30/2015     Assessment & Plan:  We had a long discussion about vasectomy. We specifically discussed the procedure, recovery and the risks, benefits and alternatives of vasectomy. I explained that the procedure entails removal of a segment of each vas deferens, each of which conducts sperm, and that the purpose of this procedure is to cause sterility (inability to produce children or cause pregnancy). Vasectomy is intended to be permanent and irreversible form of contraception. Options for fertility after vasectomy include vasectomy reversal, or sperm retrieval with in vitro fertilization. These options are not always successful, and they may be expensive. We discussed reversible forms of birth control such as condoms, IUD or diaphragms, as well as the option of freezing sperm in a sperm bank prior to the vasectomy procedure. We discussed the importance of avoiding strenuous exercise for four days after vasectomy, and the importance of refraining from any form of ejaculation for seven days after vasectomy. I explained that vasectomy does not produce immediate sterility so another form of contraceptive must be used until sterility is assured by having semen checked for sperm. Thus, a post vasectomy semen analysis is necessary to confirm sterility. Rarely, vasectomy must be repeated. We discussed the approximately 1 in 2,000 risk of pregnancy after vasectomy for men who have post-vasectomy semen analysis showing absent sperm or rare non-motile sperm. Typical side effects include a small amount of oozing blood, some discomfort and mild swelling in the area of incision.   Vasectomy does not affect sexual performance, function, please, sensation, interest, desire, satisfaction, penile erection, volume of semen or ejaculation. Other rare risks include allergy or adverse reaction to an anesthetic, testicular atrophy, hematoma, infection/abscess, prolonged tenderness of the vas deferens, pain, swelling, painful nodule or scare (called sperm granuloma) or epididymtis. We discussed chronic testicular pain syndrome. This has been reported to occur in as many as 1-2% of men and may be permanent. This can be treated with medication, small procedures or (rarely) surgery.     Abbie Sons, [place] [address], [place],  [address] [phone]

## 2017-06-28 ENCOUNTER — Encounter: Payer: Self-pay | Admitting: Urology

## 2017-06-30 ENCOUNTER — Telehealth: Payer: Self-pay | Admitting: Urology

## 2017-06-30 NOTE — Telephone Encounter (Signed)
Patient did his consult with you but the only time he could do his vasectomy was when [facility] had an opening  He is asking for a valium to take prior to this, can you send that in to his pharmacy He uses [facility] [address] in [place], [place]

## 2017-07-05 MED ORDER — DIAZEPAM 10 MG PO TABS: ORAL_TABLET | ORAL | 0 refills | Status: DC

## 2017-07-05 NOTE — Telephone Encounter (Signed)
I was unable to E scribe.  I printed the Rx

## 2017-07-05 NOTE — Telephone Encounter (Signed)
Patient will pick up today  Left up front   [facility]

## 2017-07-06 ENCOUNTER — Encounter: Payer: Self-pay | Admitting: Urology

## 2017-07-06 ENCOUNTER — Ambulatory Visit: Payer: PRIVATE HEALTH INSURANCE | Admitting: Urology

## 2017-07-06 VITALS — BP 152/78 | HR 87 | Ht 72.0 in | Wt 245.8 lb

## 2017-07-06 DIAGNOSIS — Z302 Encounter for sterilization: Secondary | ICD-10-CM | POA: Diagnosis not present

## 2017-07-06 NOTE — Progress Notes (Signed)
07/06/17  CC:  Chief Complaint  Patient presents with  . VAS    HPI: 32 year old male desiring vasectomy who presents to the office today for this procedure.  Consent was previously signed.  Additional questions were answered today.  Blood pressure (!) 152/78, pulse 87, height 6' (1.829 m), weight 245 lb 12.8 oz (111.5 kg). NED. A&Ox3.   No respiratory distress   Abd soft, NT, ND Normal external genitalia with patent urethral meatus   Bilateral Vasectomy Procedure  Pre-Procedure: - Patient's scrotum was prepped and draped for vasectomy. - The vas was palpated through the scrotal skin on the left. - 1% Xylocaine was injected into the skin and surrounding tissue for placement  - In a similar manner, the vas on the right was identified, anesthetized, and stabilized.  Procedure: - An #11 blade technique was used to open the overlying skin using a small stab incision - The left vas was isolated and brought up through the incision exposing that structure. - Bleeding points were cauterized as they occurred. - The vas was free from the surrounding structures and brought to the view. - A segment was positioned for placement with a hemostat. - A second hemostat was placed and a small segment between the two hemostats and was removed for inspection. - Each end of the transected vas lumen was fulgurated/ obliterated using needlepoint electrocautery -A fascial interposition was performed on testicular end of the vas using #3-0 chromic suture -The same procedure was performed on the right. - A single suture of #3-0 chromic catgut was used to close each lateral scrotal skin incision - A dressing was applied.  Post-Procedure: - Patient was instructed in care of the operative area - A specimen is to be delivered in 12 weeks   -Another form of contraception is to be used until post vasectomy semen analysis  Hollice Espy, MD

## 2017-08-17 ENCOUNTER — Other Ambulatory Visit: Payer: PRIVATE HEALTH INSURANCE

## 2017-08-17 DIAGNOSIS — Z9852 Vasectomy status: Secondary | ICD-10-CM

## 2017-08-19 LAB — POST-VAS SPERM EVALUATION,QUAL

## 2017-08-21 ENCOUNTER — Telehealth: Payer: Self-pay

## 2017-08-21 NOTE — Telephone Encounter (Signed)
-----   Message from Hollice Espy, MD sent at 08/19/2017  1:49 PM EST ----- Looks like there was a mishap with the specimen.  Please apologize and advised him to repeat the test.  Hollice Espy, MD

## 2017-08-21 NOTE — Telephone Encounter (Signed)
Called and lmom for pt to call our office regarding lab results.   Cristie Hem, CMA

## 2017-08-21 NOTE — Telephone Encounter (Signed)
Spoke w/ pt regarding specimen, he is going to bring in another sample to the office.   Cristie Hem, CMA

## 2017-08-23 ENCOUNTER — Other Ambulatory Visit: Payer: PRIVATE HEALTH INSURANCE

## 2017-08-23 DIAGNOSIS — Z9852 Vasectomy status: Secondary | ICD-10-CM

## 2017-08-24 LAB — POST-VAS SPERM EVALUATION,QUAL: Volume: 0.9 mL

## 2017-08-28 ENCOUNTER — Telehealth: Payer: Self-pay

## 2017-08-28 NOTE — Telephone Encounter (Signed)
Letter sent.

## 2017-08-28 NOTE — Telephone Encounter (Signed)
-----   Message from Hollice Espy, MD sent at 08/27/2017  3:03 PM EDT ----- No sperm.  Good to go.    Hollice Espy, MD

## 2017-09-07 ENCOUNTER — Encounter: Payer: Self-pay | Admitting: Podiatry

## 2017-09-07 ENCOUNTER — Ambulatory Visit: Payer: No Typology Code available for payment source | Admitting: Podiatry

## 2017-09-07 ENCOUNTER — Ambulatory Visit (INDEPENDENT_AMBULATORY_CARE_PROVIDER_SITE_OTHER): Payer: No Typology Code available for payment source

## 2017-09-07 VITALS — BP 131/81 | HR 69

## 2017-09-07 DIAGNOSIS — S90129A Contusion of unspecified lesser toe(s) without damage to nail, initial encounter: Secondary | ICD-10-CM

## 2017-09-07 DIAGNOSIS — M7752 Other enthesopathy of left foot: Secondary | ICD-10-CM

## 2017-09-07 DIAGNOSIS — M19079 Primary osteoarthritis, unspecified ankle and foot: Secondary | ICD-10-CM | POA: Diagnosis not present

## 2017-09-07 DIAGNOSIS — M778 Other enthesopathies, not elsewhere classified: Secondary | ICD-10-CM

## 2017-09-07 DIAGNOSIS — M779 Enthesopathy, unspecified: Principal | ICD-10-CM

## 2017-09-07 MED ORDER — METHYLPREDNISOLONE 4 MG PO TBPK: ORAL_TABLET | ORAL | 0 refills | Status: DC

## 2017-09-08 ENCOUNTER — Telehealth: Payer: Self-pay | Admitting: Podiatry

## 2017-09-08 NOTE — Telephone Encounter (Signed)
I spoke with pt and he states he doesn't feel so bad now, but last night the foot hurt so bad he thought he would need to use crutches. I told pt that the medication that was injected was a steroid and a local anesthetic, that on occasion pts have steroid flares, to apply ice to the area, and take OTC tylenol regular strength if tolerates for the pain. Pt states understanding.

## 2017-09-08 NOTE — Telephone Encounter (Signed)
I was seen by Dr. Milinda Pointer yesterday for some pain I was having in my second left toe. He gave me an injection and I just had a terrible reaction to it last night with pain. It was really painful last night and I really couldn't move it or walk around on it. This morning I woke up and it's still really sore and stiff. I would like to know what he put in there, if this is normal, will it go away after today? Please call me back at [phone]. Thank you.

## 2017-09-09 NOTE — Progress Notes (Signed)
  Subjective:  Patient ID: Bruce Kemp, male    DOB: [birth year],  MRN: [id number] HPI Chief Complaint  Patient presents with  . Foot Injury    Left, 2nd toe, toe was stepped on 2 yrs ago has had pain since. Notices pain more when being active/walking a lot   . New Patient (Initial Visit)    32 y.o. male presents with the above complaint.   ROS: All other systems negative findings.  Past Medical History:  Diagnosis Date  . Anxiety   . Asthma   . GERD (gastroesophageal reflux disease)    No past surgical history on file.  Current Outpatient Medications:  .  etodolac (LODINE) 500 MG tablet, Take 1 tablet (500 mg total) by mouth 2 (two) times daily., Disp: 180 tablet, Rfl: 3 .  amoxicillin (AMOXIL) 875 MG tablet, , Disp: , Rfl:  .  diazepam (VALIUM) 10 MG tablet, 1 tab po 30 min prior to procedure (Patient not taking: Reported on 09/07/2017), Disp: 1 tablet, Rfl: 0 .  escitalopram (LEXAPRO) 10 MG tablet, , Disp: , Rfl:  .  methylPREDNISolone (MEDROL DOSEPAK) 4 MG TBPK tablet, 6 day dose pack - take as directed, Disp: 21 tablet, Rfl: 0  No Known Allergies Review of Systems Objective:   Vitals:   09/07/17 1334  BP: 131/81  Pulse: 69    General: Well developed, nourished, in no acute distress, alert and oriented x3   Dermatological: Skin is warm, dry and supple bilateral. Nails x 10 are well maintained; remaining integument appears unremarkable at this time. There are no open sores, no preulcerative lesions, no rash or signs of infection present.  Vascular: Dorsalis Pedis artery and Posterior Tibial artery pedal pulses are 2/4 bilateral with immedate capillary fill time. Pedal hair growth present. No varicosities and no lower extremity edema present bilateral.   Neruologic: Grossly intact via light touch bilateral. Vibratory intact via tuning fork bilateral. Protective threshold with Semmes Wienstein monofilament intact to all pedal sites bilateral. Patellar and Achilles deep  tendon reflexes 2+ bilateral. No Babinski or clonus noted bilateral.   Musculoskeletal: No gross boney pedal deformities bilateral. No pain, crepitus, or limitation noted with foot and ankle range of motion bilateral. Muscular strength 5/5 in all groups tested bilateral.  He has pain on end range of motion of the second metatarsal phalangeal joint left foot.  It appears to be slightly larger and thicker than the contralateral foot.  This may indicate some osteoarthritic changes not visible on these particular radiographs.  Gait: Unassisted, Nonantalgic.    Radiographs:  No acute findings  Assessment & Plan:   Assessment: Capsulitis possible arthritis second metatarsal phalangeal joint left foot  Plan: Discussed etiology pathology conservative versus surgical therapies.  At this point injected 20 mg of Kenalog 5 mg Marcaine around the second metatarsophalangeal joint of the left foot.  He tolerated procedure well without complications.  Dispensed a prescription for methylprednisolone.  He will continue his Lodine after that.  We discussed appropriate shoe gear stretching exercises ice therapy shoe gear modifications.     Bruce Kemp, [place]

## 2017-10-06 ENCOUNTER — Other Ambulatory Visit: Payer: PRIVATE HEALTH INSURANCE

## 2017-10-06 DIAGNOSIS — Z302 Encounter for sterilization: Secondary | ICD-10-CM

## 2017-10-06 DIAGNOSIS — Z9852 Vasectomy status: Secondary | ICD-10-CM

## 2017-10-08 LAB — POST-VAS SPERM EVALUATION,QUAL: SEMEN VOLUME: 2.3 mL

## 2017-10-09 ENCOUNTER — Telehealth: Payer: Self-pay

## 2017-10-09 NOTE — Telephone Encounter (Signed)
Patient notified

## 2017-10-09 NOTE — Telephone Encounter (Signed)
-----   Message from Abbie Sons, MD sent at 10/08/2017  8:49 AM EDT ----- Semen sample showed no sperm.  Okay to use vasectomy as primary contraception.

## 2017-10-10 ENCOUNTER — Ambulatory Visit (INDEPENDENT_AMBULATORY_CARE_PROVIDER_SITE_OTHER): Payer: No Typology Code available for payment source | Admitting: Podiatry

## 2017-10-10 ENCOUNTER — Encounter: Payer: Self-pay | Admitting: Podiatry

## 2017-10-10 DIAGNOSIS — M7752 Other enthesopathy of left foot: Secondary | ICD-10-CM | POA: Diagnosis not present

## 2017-10-10 DIAGNOSIS — M778 Other enthesopathies, not elsewhere classified: Secondary | ICD-10-CM

## 2017-10-10 DIAGNOSIS — M779 Enthesopathy, unspecified: Principal | ICD-10-CM

## 2017-10-10 NOTE — Progress Notes (Signed)
He presents today for follow-up of his capsulitis second metatarsal phalangeal joint of his left foot.  He states that he is much improved but now she is back to the normal signal hurting toe before it had gotten worse last visit.  He states that now is the normal injured toe feeling.  Objective: Vital signs are stable he is alert and oriented x3 still has tenderness on palpation of the second metatarsophalangeal joint and on and range of motion.  Assessment: Capsulitis resolving second metatarsal phalangeal joint left foot.  Plan: At this point after Betadine skin prep I injected 2 mg of dexamethasone local anesthetic into the joint with 30-gauge syringe needle he tolerated procedure well without complications.  We will follow-up with him in 1 month if he does not improve then we will consider an MRI.

## 2017-11-09 ENCOUNTER — Ambulatory Visit: Payer: No Typology Code available for payment source | Admitting: Podiatry

## 2017-11-16 ENCOUNTER — Encounter: Payer: Self-pay | Admitting: Family Medicine

## 2017-11-16 ENCOUNTER — Ambulatory Visit: Payer: No Typology Code available for payment source | Admitting: Family Medicine

## 2017-11-16 VITALS — BP 126/74 | HR 56 | Temp 98.4°F | Resp 14 | Ht 73.0 in | Wt 244.0 lb

## 2017-11-16 DIAGNOSIS — F410 Panic disorder [episodic paroxysmal anxiety] without agoraphobia: Secondary | ICD-10-CM | POA: Diagnosis not present

## 2017-11-16 DIAGNOSIS — M5432 Sciatica, left side: Secondary | ICD-10-CM | POA: Diagnosis not present

## 2017-11-16 MED ORDER — ALPRAZOLAM 0.5 MG PO TABS: 0.5000 mg | ORAL_TABLET | Freq: Every evening | ORAL | 0 refills | Status: DC | PRN

## 2017-11-16 NOTE — Progress Notes (Signed)
Subjective:    Patient ID: Bruce Kemp, male    DOB: 18-Nov-1985, 32 y.o.   MRN: [id number]  HPI  06/05/17 Is a very pleasant 31 year old Caucasian male here today to establish care and to discuss anxiety. Patient has been dealing with anxiety his entire life. Patient will have frequent panic attacks. In fact several of his panic attacks have led to vasovagal reactions and syncope. He has been dealing with these off and on as far back as age 29. They usually occur 2-3 times a week. He is tried cognitive behavioral therapy and relaxation techniques with very little benefit. Most nights he stares at the ceiling unable to sleep. He now has a very high pressure job in [employer] and he has constant anxiety about losing his job or not doing a good enough job. He reports weight loss due to decreased appetite stemming from anxiety. He reports poor concentration because of anxiety. He even has some symptoms of depression although he denies suicidal ideation or anhedonia or major depression itself. Patient is interested in medication options to help manage and prevent the anxiety rather than just cover up the anxiety such as using sedatives on a when necessary basis.  At that time, my plan was: Patient has been suffering in dealing with anxiety off and on for more than a decade. Recently, over the last 6 months, the anxiety is become more problematic because of the stress of his job. His panic attacks are becoming more frequent and more debilitating. Therefore I believe he has generalized anxiety disorder, located about panic attacks. I recommended Lexapro 10 mg a day as a preventative strategy to help manage anxiety. He can use Xanax 0.5 mg by mouth every 8 hours when necessary for breakthrough panic attacks on an as-needed basis. Hopefully as the Lexapro takes effect, we can wean the patient away from the Xanax. We discussed the best of abuse and addiction from benzodiazepines. He denies any alcohol or recreational  drug use. Follow-up in 4-6 weeks or sooner if worse area blood pressures elevated today but the patient is anxious. I would like to recheck his blood pressure hopefully as his anxiety level improves.  11/16/17 Patient is here today requesting a refill on his Xanax.  He never started the Lexapro as we discussed at his last visit.  He was concerned about taking a medication all the time.  Instead his situation at work improved dramatically and the stress in his life calm substantially.  He is found that the Xanax is extremely beneficial.  He uses it sparingly.  He is received only 30 tablets over the last 6 months.  He finds he uses the medication maybe once a week either to help him sleep or to help, panic attack.  Simply knowing that he has the medication to take in case of anxiety gives him confidence and helps prevent the panic attacks from growing.  Previously his fear of "losing control", would stoke the anxiety and lead to a panic attack.  He now feels like he has some semblance of control.  He does not feel that he needs to take medication on a daily basis.  He states that most days, he feels fine.  He would estimate that he experiences insomnia or a panic attack less than once a week and his Xanax is working well to manage that.  Reviewing his medication list, he is taking lodine twice a day for sciatica.  This was diagnosed almost 12 months ago by a  different provider.  He states that he has pain in his lower back that radiates into his posterior left hip.  If he takes medication, the pain is manageable however without the medication, the pain returns and is unbearable.  He denies any numbness or tingling in his legs.  He denies any weakness in his legs.  He denies any imaging of his back however the pain has been present now for greater than 6 months despite conservative therapy. Past Medical History:  Diagnosis Date  . Anxiety   . Asthma   . GERD (gastroesophageal reflux disease)    No past  surgical history on file. Current Outpatient Medications on File Prior to Visit  Medication Sig Dispense Refill  . amoxicillin (AMOXIL) 875 MG tablet     . etodolac (LODINE) 500 MG tablet Take 1 tablet (500 mg total) by mouth 2 (two) times daily. 180 tablet 3   No current facility-administered medications on file prior to visit.    No Known Allergies Social History   Socioeconomic History  . Marital status: Single    Spouse name: Not on file  . Number of children: Not on file  . Years of education: Not on file  . Highest education level: Not on file  Occupational History  . Occupation: Engineer, manufacturing: [employer]  Social Needs  . Financial resource strain: Not on file  . Food insecurity:    Worry: Not on file    Inability: Not on file  . Transportation needs:    Medical: Not on file    Non-medical: Not on file  Tobacco Use  . Smoking status: Current Some Day Smoker    Types: Cigarettes  . Smokeless tobacco: Never Used  Substance and Sexual Activity  . Alcohol use: Yes    Comment: socially  . Drug use: No  . Sexual activity: Yes    Birth control/protection: Pill  Lifestyle  . Physical activity:    Days per week: Not on file    Minutes per session: Not on file  . Stress: Only a little  Relationships  . Social connections:    Talks on phone: Not on file    Gets together: Not on file    Attends religious service: Not on file    Active member of club or organization: Not on file    Attends meetings of clubs or organizations: Not on file    Relationship status: Not on file  . Intimate partner violence:    Fear of current or ex partner: No    Emotionally abused: No    Physically abused: No    Forced sexual activity: No  Other Topics Concern  . Not on file  Social History Narrative  . Not on file   Family History  Problem Relation Age of Onset  . Diabetes Father   . Hypertension Maternal Grandmother   . Diabetes Maternal Grandfather   .  Hypertension Maternal Grandfather   . Hypertension Paternal Grandmother   . Hypertension Paternal Grandfather   . Kidney cancer Neg Hx   . Kidney disease Neg Hx   . Prostate cancer Neg Hx       Review of Systems  All other systems reviewed and are negative.      Objective:   Physical Exam  Constitutional: He is oriented to person, place, and time. He appears well-developed and well-nourished.  Cardiovascular: Normal rate, regular rhythm, normal heart sounds and intact distal pulses. Exam reveals no friction  rub.  No murmur heard. Pulmonary/Chest: Effort normal and breath sounds normal. No respiratory distress. He has no wheezes. He has no rales.  Abdominal: Soft. Bowel sounds are normal. He exhibits no distension. There is no tenderness. There is no rebound.  Musculoskeletal:       Back:  Neurological: He is alert and oriented to person, place, and time. No cranial nerve deficit. He exhibits normal muscle tone. Coordination normal.  Psychiatric: He has a normal mood and affect. His behavior is normal. Judgment and thought content normal.  Vitals reviewed.         Assessment & Plan:  Panic attacks  Left sided sciatica - Plan: MR Lumbar Spine Wo Contrast  The patient is using Xanax sparingly.  I am comfortable refilling the Xanax prescription if 30 tablets last greater than 6 months.  At the present time he does not feel that he requires daily medication to help manage his anxiety and therefore we we will continue Xanax 0.5 mg every 8 hours as needed panic attack.  If the anxiety worsens, reconsider using Lexapro or Paxil.  Given the duration of his symptoms and the fact he is failed conservative therapy, I will schedule the patient for an MRI of the lumbar spine to evaluate the cause of his lumbar radiculopathy.  I also explained to the patient the risk of taking NSAID on a daily basis such as gastritis and peptic ulcer disease

## 2017-11-21 ENCOUNTER — Ambulatory Visit: Payer: Self-pay | Admitting: Registered Nurse

## 2017-11-21 VITALS — BP 130/79 | HR 63 | Temp 98.8°F

## 2017-11-21 DIAGNOSIS — J069 Acute upper respiratory infection, unspecified: Secondary | ICD-10-CM

## 2017-11-21 DIAGNOSIS — B9789 Other viral agents as the cause of diseases classified elsewhere: Principal | ICD-10-CM

## 2017-11-21 NOTE — Progress Notes (Signed)
Subjective:    Patient ID: Bruce Kemp, male    DOB: May 11, 1986, 32 y.o.   MRN: [id number]  31 y/o Caucasian established male pt c/o dry, nonproductive cough and sore throat since yesterday. States when lying flat at night, feels more pressure and tightness and more difficulty breathing. Denies otalgia, sinus pain/pressure, nasal or chest congestion, rhinorrhea. No OTCs at home for sx. Fatigue and sore throat bothering him the most.  Sore throat intermittent typically more at night.   Girlfriend with similar symptoms and diagnosed with bronchitis at doctor yesterday; has alka seltzer cough and cold and flonase/saline at home but not using; taking local honey  Typically spring allergies only  + sick contacts at work  Review of Systems  Constitutional: Positive for fatigue. Negative for activity change, appetite change, chills, diaphoresis, fever and unexpected weight change.  HENT: Positive for congestion, postnasal drip, rhinorrhea and sore throat. Negative for dental problem, drooling, ear discharge, ear pain, facial swelling, hearing loss, mouth sores, nosebleeds, sinus pressure, sinus pain, sneezing, tinnitus, trouble swallowing and voice change.   Eyes: Negative for photophobia, pain, discharge, redness, itching and visual disturbance.  Respiratory: Positive for cough and chest tightness. Negative for choking, shortness of breath, wheezing and stridor.   Cardiovascular: Negative for chest pain, palpitations and leg swelling.  Gastrointestinal: Negative for abdominal pain, diarrhea, nausea and vomiting.  Endocrine: Negative for cold intolerance and heat intolerance.  Genitourinary: Negative for dysuria.  Musculoskeletal: Positive for myalgias. Negative for arthralgias, back pain, gait problem, joint swelling, neck pain and neck stiffness.  Skin: Negative for color change, pallor, rash and wound.  Allergic/Immunologic: Positive for environmental allergies. Negative for food allergies and  immunocompromised state.  Neurological: Negative for dizziness, tremors, seizures, syncope, facial asymmetry, speech difficulty, weakness, light-headedness, numbness and headaches.  Hematological: Negative for adenopathy. Does not bruise/bleed easily.  Psychiatric/Behavioral: Positive for behavioral problems. Negative for agitation, confusion and sleep disturbance.       Objective:   Physical Exam  Constitutional: He is oriented to person, place, and time. Vital signs are normal. He appears well-developed and well-nourished. He is active and cooperative.  Non-toxic appearance. He does not have a sickly appearance. He appears ill. No distress.  HENT:  Head: Normocephalic and atraumatic.  Right Ear: Hearing, external ear and ear canal normal. A middle ear effusion is present.  Left Ear: Hearing, external ear and ear canal normal. A middle ear effusion is present.  Nose: Mucosal edema and rhinorrhea present. No nose lacerations, sinus tenderness, nasal deformity, septal deviation or nasal septal hematoma. No epistaxis.  No foreign bodies. Right sinus exhibits no maxillary sinus tenderness and no frontal sinus tenderness. Left sinus exhibits no maxillary sinus tenderness and no frontal sinus tenderness.  Mouth/Throat: Uvula is midline and mucous membranes are normal. Mucous membranes are not pale, not dry and not cyanotic. He does not have dentures. No oral lesions. No trismus in the jaw. Normal dentition. No dental abscesses, uvula swelling, lacerations or dental caries. Posterior oropharyngeal edema and posterior oropharyngeal erythema present. No oropharyngeal exudate or tonsillar abscesses. No tonsillar exudate.  Oropharynx macular ertyema cobblestoning posterior pharynx; bilateral allergic shiners; bilateral TMs air fluid level clear; clear discharge bilateral nares; frequent throat clearing   Eyes: Pupils are equal, round, and reactive to light. Conjunctivae, EOM and lids are normal. Right eye  exhibits no chemosis, no discharge, no exudate and no hordeolum. No foreign body present in the right eye. Left eye exhibits no chemosis, no discharge, no  exudate and no hordeolum. No foreign body present in the left eye. Right conjunctiva is not injected. Right conjunctiva has no hemorrhage. Left conjunctiva is not injected. Left conjunctiva has no hemorrhage. No scleral icterus. Right eye exhibits normal extraocular motion and no nystagmus. Left eye exhibits normal extraocular motion and no nystagmus. Right pupil is round and reactive. Left pupil is round and reactive. Pupils are equal.  Neck: Trachea normal and normal range of motion. Neck supple. No tracheal tenderness, no spinous process tenderness and no muscular tenderness present. No neck rigidity. No tracheal deviation, no edema, no erythema and normal range of motion present. No thyroid mass and no thyromegaly present.  Cardiovascular: Normal rate, regular rhythm, S1 normal, S2 normal, normal heart sounds and intact distal pulses. PMI is not displaced. Exam reveals no gallop and no friction rub.  No murmur heard. Pulmonary/Chest: Effort normal and breath sounds normal. No stridor. No respiratory distress. He has no decreased breath sounds. He has no wheezes. He has no rhonchi. He has no rales.  No cough observed in exam room; spoke full sentences without difficulty  Abdominal: Soft. He exhibits no distension.  Musculoskeletal: Normal range of motion. He exhibits no edema or tenderness.  Lymphadenopathy:       Head (right side): No submental, no submandibular, no tonsillar, no preauricular, no posterior auricular and no occipital adenopathy present.       Head (left side): No submental, no submandibular, no tonsillar, no preauricular, no posterior auricular and no occipital adenopathy present.    He has no cervical adenopathy.       Right cervical: No superficial cervical, no deep cervical and no posterior cervical adenopathy present.      Left  cervical: No superficial cervical, no deep cervical and no posterior cervical adenopathy present.  Neurological: He is alert and oriented to person, place, and time. He displays no atrophy and no tremor. No cranial nerve deficit or sensory deficit. He exhibits normal muscle tone. He displays no seizure activity. Coordination and gait normal. GCS eye subscore is 4. GCS verbal subscore is 5. GCS motor subscore is 6.  Skin: Skin is warm, dry and intact. Capillary refill takes less than 2 seconds. No abrasion, no bruising, no burn, no ecchymosis, no laceration, no lesion, no petechiae and no rash noted. He is not diaphoretic. No cyanosis or erythema. No pallor. Nails show no clubbing.  Psychiatric: He has a normal mood and affect. His speech is normal and behavior is normal. Judgment and thought content normal. Cognition and memory are normal.  Nursing note and vitals reviewed.         Assessment & Plan:  A-viral URI with cough  P-Patient may use normal saline nasal spray 2 sprays each nostril q2h wa as needed given 1 bottle from clinic stock phenylephrine 10mg  po q6h prn rhinitis 4 UD given from clinic stock check alka seltzer active ingredients at home to see if phenylephrine also then pick one to take; at home restart flonase 76mcg 1 spray each nostril BID.  Continue local honey po BID.  Hydrate.  Patient denied personal or family history of ENT cancer.  OTC antihistamine of choice claritin/zyrtec 10mg  po daily.  Avoid triggers if possible.  Shower prior to bedtime if exposed to triggers.  If allergic dust/dust mites recommend mattress/pillow covers/encasements; washing linens, vacuuming, sweeping, dusting weekly.  Call or return to clinic as needed if these symptoms worsen or fail to improve as anticipated especially dyspnea/dysphagia/wheezing/exudate/gum pain/swelling/fever.   Exitcare handout  on viral URI and sinus rinse given to patient.  Patient verbalized understanding of instructions, agreed with  plan of care and had no further questions at this time.  P2:  Avoidance and hand washing.

## 2017-11-22 MED ORDER — FLUTICASONE PROPIONATE 50 MCG/ACT NA SUSP: 1.0000 | Freq: Two times a day (BID) | NASAL | 1 refills | Status: DC

## 2017-11-22 MED ORDER — PHENYLEPHRINE HCL 10 MG PO TABS: 10.0000 mg | ORAL_TABLET | Freq: Four times a day (QID) | ORAL | Status: AC | PRN

## 2017-11-22 MED ORDER — SALINE SPRAY 0.65 % NA SOLN: 2.0000 | NASAL | 0 refills | Status: DC

## 2017-11-22 MED ORDER — ACETAMINOPHEN 500 MG PO TABS: 1000.0000 mg | ORAL_TABLET | Freq: Four times a day (QID) | ORAL | 0 refills | Status: AC | PRN

## 2017-11-22 NOTE — Patient Instructions (Signed)

## 2017-11-30 ENCOUNTER — Encounter: Payer: Self-pay | Admitting: Registered Nurse

## 2017-11-30 ENCOUNTER — Ambulatory Visit: Payer: Self-pay | Admitting: Registered Nurse

## 2017-11-30 VITALS — BP 132/87 | HR 56 | Temp 98.0°F

## 2017-11-30 DIAGNOSIS — J0101 Acute recurrent maxillary sinusitis: Secondary | ICD-10-CM

## 2017-11-30 DIAGNOSIS — J209 Acute bronchitis, unspecified: Secondary | ICD-10-CM

## 2017-11-30 MED ORDER — AMOXICILLIN-POT CLAVULANATE 875-125 MG PO TABS: 1.0000 | ORAL_TABLET | Freq: Two times a day (BID) | ORAL | 0 refills | Status: AC

## 2017-11-30 MED ORDER — PREDNISONE 10 MG (21) PO TBPK: ORAL_TABLET | ORAL | 0 refills | Status: DC

## 2017-11-30 MED ORDER — ALBUTEROL SULFATE HFA 108 (90 BASE) MCG/ACT IN AERS: 1.0000 | INHALATION_SPRAY | RESPIRATORY_TRACT | 0 refills | Status: DC | PRN

## 2017-11-30 NOTE — Progress Notes (Signed)
Subjective:    Patient ID: Bruce Kemp, male    DOB: 1986/05/23, 31 y.o.   MRN: [id number]  32 y/o Caucasian established male pt c/o continued cough. Was nonproductive when seen 6/11. Became productive early this week. Began taking Mucinex to clear chest congestion. Now with frontal sinus pain/pressure, nasal congestion, rhinorrhea, headache. Sore throat mildly improved. Denies otalgia, fever. Had leftover Amoxicillin at home from dentist and took one tab last pm and one this am as he felt this has become a sinus infection. Wanted to know if he should continue this. Girlfriend feeling better after 3 days of similar symptoms; he has been worsening since last office visit.  Required prednisone with augmentin and inhaler 2017     Review of Systems  Constitutional: Positive for fatigue. Negative for activity change, appetite change, chills, diaphoresis, fever and unexpected weight change.  HENT: Positive for congestion, postnasal drip, rhinorrhea, sinus pressure, sinus pain and sore throat. Negative for dental problem, drooling, ear discharge, ear pain, facial swelling, hearing loss, mouth sores, nosebleeds, sneezing, tinnitus, trouble swallowing and voice change.   Eyes: Negative for photophobia, pain, discharge, redness, itching and visual disturbance.  Respiratory: Positive for cough. Negative for choking, chest tightness, shortness of breath, wheezing and stridor.   Cardiovascular: Negative for chest pain, palpitations and leg swelling.  Gastrointestinal: Negative for abdominal pain, blood in stool, constipation, diarrhea, nausea and vomiting.  Endocrine: Negative for cold intolerance and heat intolerance.  Genitourinary: Negative for dysuria.  Musculoskeletal: Negative for arthralgias, back pain, gait problem, joint swelling, myalgias, neck pain and neck stiffness.  Skin: Negative for color change, pallor, rash and wound.  Allergic/Immunologic: Positive for environmental allergies. Negative  for food allergies and immunocompromised state.  Neurological: Positive for headaches. Negative for dizziness, tremors, seizures, syncope, facial asymmetry, speech difficulty, weakness, light-headedness and numbness.  Hematological: Negative for adenopathy. Does not bruise/bleed easily.  Psychiatric/Behavioral: Positive for sleep disturbance. Negative for agitation and confusion.       Objective:   Physical Exam  Constitutional: He is oriented to person, place, and time. Vital signs are normal. He appears well-developed and well-nourished. He is active and cooperative.  Non-toxic appearance. He does not have a sickly appearance. He appears ill. No distress.  HENT:  Head: Normocephalic and atraumatic.  Right Ear: Hearing, external ear and ear canal normal. A middle ear effusion is present.  Left Ear: Hearing, external ear and ear canal normal. A middle ear effusion is present.  Nose: Mucosal edema and rhinorrhea present. No nose lacerations, sinus tenderness, nasal deformity, septal deviation or nasal septal hematoma. No epistaxis.  No foreign bodies. Right sinus exhibits maxillary sinus tenderness and frontal sinus tenderness. Left sinus exhibits maxillary sinus tenderness and frontal sinus tenderness.  Mouth/Throat: Uvula is midline and mucous membranes are normal. Mucous membranes are not pale, not dry and not cyanotic. He does not have dentures. No oral lesions. No trismus in the jaw. Normal dentition. No dental abscesses, uvula swelling, lacerations or dental caries. Posterior oropharyngeal edema and posterior oropharyngeal erythema present. No oropharyngeal exudate or tonsillar abscesses. No tonsillar exudate.  Bilateral allergic shiners; cobblestoning posterior pharynx; bilateral TMs air fluid level clear; bilateral nasal turbinates with clear discharge erythema/edema; oropharynx macular erythema; maxillary greater than frontal sinuses TTP  Eyes: Pupils are equal, round, and reactive to light.  Conjunctivae, EOM and lids are normal. Right eye exhibits no chemosis, no discharge, no exudate and no hordeolum. No foreign body present in the right eye. Left eye exhibits  no chemosis, no discharge, no exudate and no hordeolum. No foreign body present in the left eye. Right conjunctiva is not injected. Right conjunctiva has no hemorrhage. Left conjunctiva is not injected. Left conjunctiva has no hemorrhage. No scleral icterus. Right eye exhibits normal extraocular motion and no nystagmus. Left eye exhibits normal extraocular motion and no nystagmus. Right pupil is round and reactive. Left pupil is round and reactive. Pupils are equal.  Neck: Trachea normal, normal range of motion and phonation normal. Neck supple. No tracheal tenderness, no spinous process tenderness and no muscular tenderness present. No neck rigidity. No tracheal deviation, no edema, no erythema and normal range of motion present. No thyroid mass present.  Cardiovascular: Normal rate, regular rhythm, S1 normal, S2 normal, normal heart sounds and intact distal pulses. PMI is not displaced. Exam reveals no gallop and no friction rub.  No murmur heard. Pulmonary/Chest: Effort normal and breath sounds normal. No stridor. No respiratory distress. He has no decreased breath sounds. He has no wheezes. He has no rhonchi. He has no rales.  Frequent nonproductive cough in exam room; spoke full sentences without difficulty  Abdominal: Soft. Normal appearance. He exhibits no distension, no fluid wave and no ascites. There is no rigidity and no guarding.  Musculoskeletal: Normal range of motion. He exhibits no edema, tenderness or deformity.       Right shoulder: Normal.       Left shoulder: Normal.       Right elbow: Normal.      Left elbow: Normal.       Right hip: Normal.       Left hip: Normal.       Right knee: Normal.       Left knee: Normal.       Cervical back: Normal.       Thoracic back: Normal.       Lumbar back: Normal.        Right hand: Normal.       Left hand: Normal.  Lymphadenopathy:       Head (right side): No submental, no submandibular, no tonsillar, no preauricular, no posterior auricular and no occipital adenopathy present.       Head (left side): No submental, no submandibular, no tonsillar, no preauricular, no posterior auricular and no occipital adenopathy present.    He has no cervical adenopathy.       Right cervical: No superficial cervical, no deep cervical and no posterior cervical adenopathy present.      Left cervical: No superficial cervical, no deep cervical and no posterior cervical adenopathy present.  Neurological: He is alert and oriented to person, place, and time. He has normal strength. He is not disoriented. He displays no atrophy and no tremor. No cranial nerve deficit or sensory deficit. He exhibits normal muscle tone. He displays no seizure activity. Coordination and gait normal. GCS eye subscore is 4. GCS verbal subscore is 5. GCS motor subscore is 6.  On/off exam table; in/out of chair without difficulty; gait sure and steady in hallway  Skin: Skin is warm, dry and intact. Capillary refill takes less than 2 seconds. No abrasion, no bruising, no burn, no ecchymosis, no laceration, no lesion, no petechiae and no rash noted. He is not diaphoretic. No cyanosis or erythema. No pallor. Nails show no clubbing.  Psychiatric: He has a normal mood and affect. His speech is normal and behavior is normal. Judgment and thought content normal. He is not actively hallucinating. Cognition and memory are  normal. He is attentive.  Nursing note and vitals reviewed.         Assessment & Plan:  A-acute recurrent maxillary sinusitis and bronchitis  P-Stop amoxicillin start augmentin 875mg  po BID x 9 days since already took 1 day of amoxicillin #20 RF0 dispensed from [facility].  Prednisone taper 10mg  po daily with breakfast #21 RF0 (60/50/40/08/30/08)  Patient preferred taper to 40mg  x 5 days which was previous  dosing schedule Nov 2017.  Continue flonase, saline and OTC antihistamine/mucinex.  Discussed did not recommend mucinex at bedtime promotes cough and interruption of sleep but to take in am and late afternoon only if feeling mucous thick unable to expectorate.  If runny thin stop mucinex.continue flonase 1 spray each nostril BID, saline 2 sprays each nostril q2h wa prn congestion.   Denied personal or family history of ENT cancer.  Shower BID especially prior to bed. No evidence of systemic bacterial infection, non toxic and well hydrated.  I do not see where any further testing or imaging is necessary at this time.   I will suggest supportive care, rest, good hygiene and encourage the patient to take adequate fluids.  The patient is to return to clinic or EMERGENCY ROOM if symptoms worsen or change significantly.  Exitcare handout on sinusitis and sinus rinse.  Patient verbalized agreement and understanding of treatment plan and had no further questions at this time.   P2:  Hand washing and cover cough   Cough lozenges po q2h prn cough   Prednisone taper 10mg  (60/50/40/30/20/10mg ) po daily with breakfast #21 RF0 dispensed from [facility].  Discussed possible side effects increased/decreased appetite, difficulty sleeping, increased blood sugar, increased blood pressure and heart rate.  Albuterol MDI 90mcg 1-2 puffs po q4-6h prn protracted cough/wheeze #1 RF0 side effect increased heart rate. Bronchitis simple, community acquired, may have started as viral (probably respiratory syncytial, parainfluenza, influenza, or adenovirus), but now evidence of acute purulent bronchitis with resultant bronchial edema and mucus formation.  Viruses are the most common cause of bronchial inflammation in otherwise healthy adults with acute bronchitis.  The appearance of sputum is not predictive of whether a bacterial infection is present.  Purulent sputum is most often caused by viral infections.  There are a small portion of those  caused by non-viral agents being Mycoplama pneumonia.  Microscopic examination or C&S of sputum in the healthy adult with acute bronchitis is generally not helpful (usually negative or normal respiratory flora) other considerations being cough from upper respiratory tract infections, sinusitis or allergic syndromes (mild asthma or viral pneumonia).  Differential Diagnoses:  reactive airway disease (asthma, allergic aspergillosis (eosinophilia), chronic bronchitis, respiratory infection (sinusitis, common cold, pneumonia), congestive heart failure, reflux esophagitis, bronchogenic tumor, aspiration syndromes and/or exposure to pulmonary irritants/smoke. Without high fever, severe dyspnea, lack of physical findings or other risk factors, I will hold on a chest radiograph and CBC at this time.  I discussed that approximately 50% of patients with acute bronchitis have a cough that lasts up to three weeks, and 25% for over a month.  Tylenol 500mg  one to two tablets every four to six hours as needed for fever or myalgias.  No aspirin. Exitcare handout on bronchitis and inhaler use.  ER if hemopthysis, SOB, worst chest pain of life.   Patient instructed to follow up in one week or sooner if symptoms worsen.  Patient verbalized agreement and understanding of treatment plan.  P2:  hand washing and cover cough

## 2017-11-30 NOTE — Patient Instructions (Signed)
Acute Bronchitis, Adult Acute bronchitis is sudden (acute) swelling of the air tubes (bronchi) in the lungs. Acute bronchitis causes these tubes to fill with mucus, which can make it hard to breathe. It can also cause coughing or wheezing. In adults, acute bronchitis usually goes away within 2 weeks. A cough caused by bronchitis may last up to 3 weeks. Smoking, allergies, and asthma can make the condition worse. Repeated episodes of bronchitis may cause further lung problems, such as chronic obstructive pulmonary disease (COPD). What are the causes? This condition can be caused by germs and by substances that irritate the lungs, including:  Cold and flu viruses. This condition is most often caused by the same virus that causes a cold.  Bacteria.  Exposure to tobacco smoke, dust, fumes, and air pollution.  What increases the risk? This condition is more likely to develop in people who:  Have close contact with someone with acute bronchitis.  Are exposed to lung irritants, such as tobacco smoke, dust, fumes, and vapors.  Have a weak immune system.  Have a respiratory condition such as asthma.  What are the signs or symptoms? Symptoms of this condition include:  A cough.  Coughing up clear, yellow, or green mucus.  Wheezing.  Chest congestion.  Shortness of breath.  A fever.  Body aches.  Chills.  A sore throat.  How is this diagnosed? This condition is usually diagnosed with a physical exam. During the exam, your health care provider may order tests, such as chest X-rays, to rule out other conditions. He or she may also:  Test a sample of your mucus for bacterial infection.  Check the level of oxygen in your blood. This is done to check for pneumonia.  Do a chest X-ray or lung function testing to rule out pneumonia and other conditions.  Perform blood tests.  Your health care provider will also ask about your symptoms and medical history. How is this  treated? Most cases of acute bronchitis clear up over time without treatment. Your health care provider may recommend:  Drinking more fluids. Drinking more makes your mucus thinner, which may make it easier to breathe.  Taking a medicine for a fever or cough.  Taking an antibiotic medicine.  Using an inhaler to help improve shortness of breath and to control a cough.  Using a cool mist vaporizer or humidifier to make it easier to breathe.  Follow these instructions at home: Medicines  Take over-the-counter and prescription medicines only as told by your health care provider.  If you were prescribed an antibiotic, take it as told by your health care provider. Do not stop taking the antibiotic even if you start to feel better. General instructions  Get plenty of rest.  Drink enough fluids to keep your urine clear or pale yellow.  Avoid smoking and secondhand smoke. Exposure to cigarette smoke or irritating chemicals will make bronchitis worse. If you smoke and you need help quitting, ask your health care provider. Quitting smoking will help your lungs heal faster.  Use an inhaler, cool mist vaporizer, or humidifier as told by your health care provider.  Keep all follow-up visits as told by your health care provider. This is important. How is this prevented? To lower your risk of getting this condition again:  Wash your hands often with soap and water. If soap and water are not available, use hand sanitizer.  Avoid contact with people who have cold symptoms.  Try not to touch your hands to your   mouth, nose, or eyes.  Make sure to get the flu shot every year.  Contact a health care provider if:  Your symptoms do not improve in 2 weeks of treatment. Get help right away if:  You cough up blood.  You have chest pain.  You have severe shortness of breath.  You become dehydrated.  You faint or keep feeling like you are going to faint.  You keep vomiting.  You have a  severe headache.  Your fever or chills gets worse. This information is not intended to replace advice given to you by your health care provider. Make sure you discuss any questions you have with your health care provider. Document Released: 07/07/2004 Document Revised: 12/23/2015 Document Reviewed: 11/18/2015 Elsevier Interactive Patient Education  2018 [employer]. How to Use a Metered Dose Inhaler A metered dose inhaler is a handheld device for taking medicine that must be breathed into the lungs (inhaled). The device can be used to deliver a variety of inhaled medicines, including:  Quick relief or rescue medicines, such as bronchodilators.  Controller medicines, such as corticosteroids.  The medicine is delivered by pushing down on a metal canister to release a preset amount of spray and medicine. Each device contains the amount of medicine that is needed for a preset number of uses (inhalations). Your health care provider may recommend that you use a spacer with your inhaler to help you take the medicine more effectively. A spacer is a plastic tube with a mouthpiece on one end and an opening that connects to the inhaler on the other end. A spacer holds the medicine in a tube for a short time, which allows you to inhale more medicine. What are the risks? If you do not use your inhaler correctly, medicine might not reach your lungs to help you breathe. Inhaler medicine can cause side effects, such as:  Mouth or throat infection.  Cough.  Hoarseness.  Headache.  Nausea and vomiting.  Lung infection (pneumonia) in people who have a lung condition called COPD.  How to use a metered dose inhaler without a spacer 1. Remove the cap from the inhaler. 2. If you are using the inhaler for the first time, shake it for 5 seconds, turn it away from your face, then release 4 puffs into the air. This is called priming. 3. Shake the inhaler for 5 seconds. 4. Position the inhaler so the top of  the canister faces up. 5. Put your index finger on the top of the medicine canister. Support the bottom of the inhaler with your thumb. 6. Breathe out normally and as completely as possible, away from the inhaler. 7. Either place the inhaler between your teeth and close your lips tightly around the mouthpiece, or hold the inhaler 1-2 inches (2.5-5 cm) away from your open mouth. Keep your tongue down out of the way. If you are unsure which technique to use, ask your health care provider. 8. Press the canister down with your index finger to release the medicine, then inhale deeply and slowly through your mouth (not your nose) until your lungs are completely filled. Inhaling should take 4-6 seconds. 9. Hold the medicine in your lungs for 5-10 seconds (10 seconds is best). This helps the medicine get into the small airways of your lungs. 10. With your lips in a tight circle (pursed), breathe out slowly. 11. Repeat steps 3-10 until you have taken the number of puffs that your health care provider directed. Wait about 1 minute between  puffs or as directed. 12. Put the cap on the inhaler. 13. If you are using a steroid inhaler, rinse your mouth with water, gargle, and spit out the water. Do not swallow the water. How to use a metered dose inhaler with a spacer 1. Remove the cap from the inhaler. 2. If you are using the inhaler for the first time, shake it for 5 seconds, turn it away from your face, then release 4 puffs into the air. This is called priming. 3. Shake the inhaler for 5 seconds. 4. Place the open end of the spacer onto the inhaler mouthpiece. 5. Position the inhaler so the top of the canister faces up and the spacer mouthpiece faces you. 6. Put your index finger on the top of the medicine canister. Support the bottom of the inhaler and the spacer with your thumb. 7. Breathe out normally and as completely as possible, away from the spacer. 8. Place the spacer between your teeth and close your  lips tightly around it. Keep your tongue down out of the way. 9. Press the canister down with your index finger to release the medicine, then inhale deeply and slowly through your mouth (not your nose) until your lungs are completely filled. Inhaling should take 4-6 seconds. 10. Hold the medicine in your lungs for 5-10 seconds (10 seconds is best). This helps the medicine get into the small airways of your lungs. 11. With your lips in a tight circle (pursed), breathe out slowly. 12. Repeat steps 3-11 until you have taken the number of puffs that your health care provider directed. Wait about 1 minute between puffs or as directed. 13. Remove the spacer from the inhaler and put the cap on the inhaler. 14. If you are using a steroid inhaler, rinse your mouth with water, gargle, and spit out the water. Do not swallow the water. Follow these instructions at home:  Take your inhaled medicine only as told by your health care provider. Do not use the inhaler more than directed by your health care provider.  Keep all follow-up visits as told by your health care provider. This is important.  If your inhaler has a counter, you can check it to determine how full your inhaler is. If your inhaler does not have a counter, ask your health care provider when you will need to refill your inhaler and write the refill date on a calendar or on your inhaler canister. Note that you cannot know when an inhaler is empty by shaking it.  Follow directions on the package insert for care and cleaning of your inhaler and spacer. Contact a health care provider if:  Symptoms are only partially relieved with your inhaler.  You are having trouble using your inhaler.  You have an increase in phlegm.  You have headaches. Get help right away if:  You feel little or no relief after using your inhaler.  You have dizziness.  You have a fast heart rate.  You have chills or a fever.  You have night sweats.  There is  blood in your phlegm. Summary  A metered dose inhaler is a handheld device for taking medicine that must be breathed into the lungs (inhaled).  The medicine is delivered by pushing down on a metal canister to release a preset amount of spray and medicine.  Each device contains the amount of medicine that is needed for a preset number of uses (inhalations). This information is not intended to replace advice given to you by  your health care provider. Make sure you discuss any questions you have with your health care provider. Document Released: 05/30/2005 Document Revised: 04/19/2016 Document Reviewed: 04/19/2016 Elsevier Interactive Patient Education  2017 [employer]. Sinusitis, Adult Sinusitis is soreness and inflammation of your sinuses. Sinuses are hollow spaces in the bones around your face. Your sinuses are located:  Around your eyes.  In the middle of your forehead.  Behind your nose.  In your cheekbones.  Your sinuses and nasal passages are lined with a stringy fluid (mucus). Mucus normally drains out of your sinuses. When your nasal tissues become inflamed or swollen, the mucus can become trapped or blocked so air cannot flow through your sinuses. This allows bacteria, viruses, and funguses to grow, which leads to infection. Sinusitis can develop quickly and last for 7?10 days (acute) or for more than 12 weeks (chronic). Sinusitis often develops after a cold. What are the causes? This condition is caused by anything that creates swelling in the sinuses or stops mucus from draining, including:  Allergies.  Asthma.  Bacterial or viral infection.  Abnormally shaped bones between the nasal passages.  Nasal growths that contain mucus (nasal polyps).  Narrow sinus openings.  Pollutants, such as chemicals or irritants in the air.  A foreign object stuck in the nose.  A fungal infection. This is rare.  What increases the risk? The following factors may make you more  likely to develop this condition:  Having allergies or asthma.  Having had a recent cold or respiratory tract infection.  Having structural deformities or blockages in your nose or sinuses.  Having a weak immune system.  Doing a lot of swimming or diving.  Overusing nasal sprays.  Smoking.  What are the signs or symptoms? The main symptoms of this condition are pain and a feeling of pressure around the affected sinuses. Other symptoms include:  Upper toothache.  Earache.  Headache.  Bad breath.  Decreased sense of smell and taste.  A cough that may get worse at night.  Fatigue.  Fever.  Thick drainage from your nose. The drainage is often green and it may contain pus (purulent).  Stuffy nose or congestion.  Postnasal drip. This is when extra mucus collects in the throat or back of the nose.  Swelling and warmth over the affected sinuses.  Sore throat.  Sensitivity to light.  How is this diagnosed? This condition is diagnosed based on symptoms, a medical history, and a physical exam. To find out if your condition is acute or chronic, your health care provider may:  Look in your nose for signs of nasal polyps.  Tap over the affected sinus to check for signs of infection.  View the inside of your sinuses using an imaging device that has a light attached (endoscope).  If your health care provider suspects that you have chronic sinusitis, you may also:  Be tested for allergies.  Have a sample of mucus taken from your nose (nasal culture) and checked for bacteria.  Have a mucus sample examined to see if your sinusitis is related to an allergy.  If your sinusitis does not respond to treatment and it lasts longer than 8 weeks, you may have an MRI or CT scan to check your sinuses. These scans also help to determine how severe your infection is. In rare cases, a bone biopsy may be done to rule out more serious types of fungal sinus disease. How is this  treated? Treatment for sinusitis depends on the cause  and whether your condition is chronic or acute. If a virus is causing your sinusitis, your symptoms will go away on their own within 10 days. You may be given medicines to relieve your symptoms, including:  Topical nasal decongestants. They shrink swollen nasal passages and let mucus drain from your sinuses.  Antihistamines. These drugs block inflammation that is triggered by allergies. This can help to ease swelling in your nose and sinuses.  Topical nasal corticosteroids. These are nasal sprays that ease inflammation and swelling in your nose and sinuses.  Nasal saline washes. These rinses can help to get rid of thick mucus in your nose.  If your condition is caused by bacteria, you will be given an antibiotic medicine. If your condition is caused by a fungus, you will be given an antifungal medicine. Surgery may be needed to correct underlying conditions, such as narrow nasal passages. Surgery may also be needed to remove polyps. Follow these instructions at home: Medicines  Take, use, or apply over-the-counter and prescription medicines only as told by your health care provider. These may include nasal sprays.  If you were prescribed an antibiotic medicine, take it as told by your health care provider. Do not stop taking the antibiotic even if you start to feel better. Hydrate and Humidify  Drink enough water to keep your urine clear or pale yellow. Staying hydrated will help to thin your mucus.  Use a cool mist humidifier to keep the humidity level in your home above 50%.  Inhale steam for 10-15 minutes, 3-4 times a day or as told by your health care provider. You can do this in the bathroom while a hot shower is running.  Limit your exposure to cool or dry air. Rest  Rest as much as possible.  Sleep with your head raised (elevated).  Make sure to get enough sleep each night. General instructions  Apply a warm, moist  washcloth to your face 3-4 times a day or as told by your health care provider. This will help with discomfort.  Wash your hands often with soap and water to reduce your exposure to viruses and other germs. If soap and water are not available, use hand sanitizer.  Do not smoke. Avoid being around people who are smoking (secondhand smoke).  Keep all follow-up visits as told by your health care provider. This is important. Contact a health care provider if:  You have a fever.  Your symptoms get worse.  Your symptoms do not improve within 10 days. Get help right away if:  You have a severe headache.  You have persistent vomiting.  You have pain or swelling around your face or eyes.  You have vision problems.  You develop confusion.  Your neck is stiff.  You have trouble breathing. This information is not intended to replace advice given to you by your health care provider. Make sure you discuss any questions you have with your health care provider. Document Released: 05/30/2005 Document Revised: 01/24/2016 Document Reviewed: 03/25/2015 Elsevier Interactive Patient Education  2018 [employer]. Sinus Rinse What is a sinus rinse? A sinus rinse is a simple home treatment that is used to rinse your sinuses with a sterile mixture of salt and water (saline solution). Sinuses are air-filled spaces in your skull behind the bones of your face and forehead that open into your nasal cavity. You will use the following:  Saline solution.  Neti pot or spray bottle. This releases the saline solution into your nose and through your  sinuses. Neti pots and spray bottles can be purchased at [employer], a health food store, or online.  When would I do a sinus rinse? A sinus rinse can help to clear mucus, dirt, dust, or pollen from the nasal cavity. You may do a sinus rinse when you have a cold, a virus, nasal allergy symptoms, a sinus infection, or stuffiness in the nose or sinuses. If  you are considering a sinus rinse:  Ask your child's health care provider before performing a sinus rinse on your child.  Do not do a sinus rinse if you have had ear or nasal surgery, ear infection, or blocked ears.  How do I do a sinus rinse?  Wash your hands.  Disinfect your device according to the directions provided and then dry it.  Use the solution that comes with your device or one that is sold separately in stores. Follow the mixing directions on the package.  Fill your device with the amount of saline solution as directed by the device instructions.  Stand over a sink and tilt your head sideways over the sink.  Place the spout of the device in your upper nostril (the one closer to the ceiling).  Gently pour or squeeze the saline solution into the nasal cavity. The liquid should drain to the lower nostril if you are not overly congested.  Gently blow your nose. Blowing too hard may cause ear pain.  Repeat in the other nostril.  Clean and rinse your device with clean water and then air-dry it. Are there risks of a sinus rinse? Sinus rinse is generally very safe and effective. However, there are a few risks, which include:  A burning sensation in the sinuses. This may happen if you do not make the saline solution as directed. Make sure to follow all directions when making the saline solution.  Infection from contaminated water. This is rare, but possible.  Nasal irritation.  This information is not intended to replace advice given to you by your health care provider. Make sure you discuss any questions you have with your health care provider. Document Released: 12/25/2013 Document Revised: 04/26/2016 Document Reviewed: 10/15/2013 Elsevier Interactive Patient Education  2017 [employer].

## 2017-12-08 ENCOUNTER — Other Ambulatory Visit: Payer: Self-pay

## 2017-12-22 ENCOUNTER — Telehealth: Payer: Self-pay

## 2017-12-22 NOTE — Telephone Encounter (Signed)
Pharmacy notified.

## 2017-12-22 NOTE — Telephone Encounter (Signed)
Please advise the pharmacy to forward this to the patient's PCP

## 2017-12-22 NOTE — Telephone Encounter (Signed)
Received fax requesting refill for Fluticasone Prop 50 MCG Spray

## 2018-01-09 ENCOUNTER — Telehealth: Payer: Self-pay | Admitting: *Deleted

## 2018-01-09 MED ORDER — ETODOLAC 500 MG PO TABS: 500.0000 mg | ORAL_TABLET | Freq: Two times a day (BID) | ORAL | 0 refills | Status: DC

## 2018-01-09 NOTE — Telephone Encounter (Signed)
Requesting appt with NP to discuss continuing Etodolac and new Rx as previous one now expired, >35yr old. NP on vacation. Pt reports he will be out of state when she returns to clinic and will run out before he returns to work.  One month bridge refill completed to preferred pharmacy and appt made for pt in clinic upon his return to work to discuss continuance of med with NP. Pt agreeable to this. No further questions/concerns.

## 2018-01-11 ENCOUNTER — Telehealth: Payer: Self-pay | Admitting: Family Medicine

## 2018-01-11 NOTE — Telephone Encounter (Signed)
Pt called and states that he has been taking the Lexapro x 1 week and he can not sleep with medication. The Xanax are working great for him but he stopped the Lexapro and would like to know if you recommend anything else for him to take?

## 2018-01-11 NOTE — Telephone Encounter (Signed)
Per Dr. Dennard Schaumann - He could replace lexapro with zoloft 50 mg poqhs and recheck in 1 month.

## 2018-01-12 MED ORDER — SERTRALINE HCL 50 MG PO TABS: 50.0000 mg | ORAL_TABLET | Freq: Every day | ORAL | 3 refills | Status: DC

## 2018-01-12 NOTE — Telephone Encounter (Signed)
Patient aware of providers recommendations via vm and med sent to [facility]

## 2018-01-18 NOTE — Telephone Encounter (Signed)
Patient will need CBC, CMP as recently seen in clinic and BP completed for chronic use etodolac.  If labs normal will refill for one year.  Agreed with bridge refill patient requested to continue medication working well for him.

## 2018-01-22 NOTE — Telephone Encounter (Signed)
Pt scheduled for lab draw tomorrow 8/13 as he has now returned to work from vacation.

## 2018-01-23 ENCOUNTER — Ambulatory Visit: Payer: Self-pay | Admitting: *Deleted

## 2018-01-23 VITALS — BP 136/86 | HR 75 | Ht 73.0 in | Wt 245.0 lb

## 2018-01-23 DIAGNOSIS — Z0000 Encounter for general adult medical examination without abnormal findings: Secondary | ICD-10-CM

## 2018-01-23 DIAGNOSIS — Z79899 Other long term (current) drug therapy: Secondary | ICD-10-CM

## 2018-01-23 NOTE — Telephone Encounter (Signed)
noted 

## 2018-01-23 NOTE — Progress Notes (Signed)
[employer] insurance premium discount evaluation: Did not take discount last year. Would like to qualify this year.  Labs Drawn for [employer] and etodolac refill eval. Replacements ROI form signed. Tobacco Free Attestation form signed.  Forms placed in paper chart.

## 2018-01-24 ENCOUNTER — Telehealth: Payer: Self-pay | Admitting: Registered Nurse

## 2018-01-24 ENCOUNTER — Encounter: Payer: Self-pay | Admitting: Registered Nurse

## 2018-01-24 LAB — CMP12+LP+TP+TSH+6AC+CBC/D/PLT
ALK PHOS: 54 IU/L (ref 39–117)
ALT: 35 IU/L (ref 0–44)
AST: 33 IU/L (ref 0–40)
Albumin/Globulin Ratio: 1.9 (ref 1.2–2.2)
Albumin: 4.7 g/dL (ref 3.5–5.5)
BASOS ABS: 0 10*3/uL (ref 0.0–0.2)
BASOS: 0 %
BILIRUBIN TOTAL: 1 mg/dL (ref 0.0–1.2)
BUN/Creatinine Ratio: 17 (ref 9–20)
BUN: 15 mg/dL (ref 6–20)
CHLORIDE: 104 mmol/L (ref 96–106)
CHOLESTEROL TOTAL: 232 mg/dL — AB (ref 100–199)
Calcium: 9.7 mg/dL (ref 8.7–10.2)
Chol/HDL Ratio: 5.7 ratio — ABNORMAL HIGH (ref 0.0–5.0)
Creatinine, Ser: 0.89 mg/dL (ref 0.76–1.27)
EOS (ABSOLUTE): 0.3 10*3/uL (ref 0.0–0.4)
ESTIMATED CHD RISK: 1.2 times avg. — AB (ref 0.0–1.0)
Eos: 5 %
FREE THYROXINE INDEX: 1.4 (ref 1.2–4.9)
GFR calc Af Amer: 132 mL/min/{1.73_m2} (ref >59)
GFR calc non Af Amer: 114 mL/min/{1.73_m2} (ref >59)
GGT: 27 IU/L (ref 0–65)
GLUCOSE: 106 mg/dL — AB (ref 65–99)
Globulin, Total: 2.5 g/dL (ref 1.5–4.5)
HDL: 41 mg/dL (ref >39)
HEMATOCRIT: 46.9 % (ref 37.5–51.0)
HEMOGLOBIN: 16 g/dL (ref 13.0–17.7)
IMMATURE GRANULOCYTES: 0 %
Immature Grans (Abs): 0 10*3/uL (ref 0.0–0.1)
Iron: 123 ug/dL (ref 38–169)
LDH: 183 IU/L (ref 121–224)
LDL CALC: 144 mg/dL — AB (ref 0–99)
LYMPHS ABS: 2.4 10*3/uL (ref 0.7–3.1)
Lymphs: 44 %
MCH: 31 pg (ref 26.6–33.0)
MCHC: 34.1 g/dL (ref 31.5–35.7)
MCV: 91 fL (ref 79–97)
Monocytes Absolute: 0.4 10*3/uL (ref 0.1–0.9)
Monocytes: 7 %
NEUTROS PCT: 44 %
Neutrophils Absolute: 2.4 10*3/uL (ref 1.4–7.0)
Phosphorus: 3.8 mg/dL (ref 2.5–4.5)
Platelets: 263 10*3/uL (ref 150–450)
Potassium: 4.3 mmol/L (ref 3.5–5.2)
RBC: 5.16 x10E6/uL (ref 4.14–5.80)
RDW: 13.2 % (ref 12.3–15.4)
SODIUM: 142 mmol/L (ref 134–144)
T3 Uptake Ratio: 27 % (ref 24–39)
T4, Total: 5.2 ug/dL (ref 4.5–12.0)
TSH: 0.932 u[IU]/mL (ref 0.450–4.500)
Total Protein: 7.2 g/dL (ref 6.0–8.5)
Triglycerides: 237 mg/dL — ABNORMAL HIGH (ref 0–149)
URIC ACID: 4.3 mg/dL (ref 3.7–8.6)
VLDL CHOLESTEROL CAL: 47 mg/dL — AB (ref 5–40)
WBC: 5.6 10*3/uL (ref 3.4–10.8)

## 2018-01-24 LAB — HGB A1C W/O EAG: Hgb A1c MFr Bld: 5.3 % (ref 4.8–5.6)

## 2018-01-24 MED ORDER — ETODOLAC 500 MG PO TABS: 500.0000 mg | ORAL_TABLET | Freq: Two times a day (BID) | ORAL | 2 refills | Status: AC

## 2018-01-24 NOTE — Telephone Encounter (Signed)
Labs completed and kidney/liver/CBC/electrolytes/HgbA1c WNL  Lipids and spot glucose elevated  Patient had requested refill of etodolac electronic Rx sent to his pharmacy of choice for 500mg  po BID #[id number].  Patient to perform lifestyle changes (weight loss/exercise/dietary fiber/decrease added sugars) for cholesterol and repeat labs fasting in 6 months/follow up with PCM.  Message sent with results to patient via [facility].

## 2018-01-25 NOTE — Progress Notes (Signed)
Results reviewed with pt. Glucose elevated but A1c WNL, improved from previous. Lipids elevated, worse than previous. He reports family Hx hyperlipidemia. Diet and exercise recommendations discussed re: lipids, wt management as BMI 32. Handouts given on low cholesterol and heart healthy diet. 150 min moderate intensity exercise weekly. BP rechecked manually today. Improved but still elevated. Appt made for repeat labs in 6 months as per NP request- 07/19/18. Labs originally scheduled as part of evaluation for refill of chronic NSAID. Pt denies any gastritis related sx or other complaints r/t NSAID use. Good relief with med for chronic pain and would like to continue. Routine f/u with pcp. Copy of labs provided to pt. Results routed to pcp per pt request. No further questions/concerns.

## 2018-01-26 NOTE — Progress Notes (Signed)
Result review note entered as nursing note addendum to encounter. Was not routed/CC'd to ordering provider. Available under encounter notes if note review is needed.

## 2018-02-04 ENCOUNTER — Other Ambulatory Visit: Payer: Self-pay | Admitting: Family Medicine

## 2018-05-08 ENCOUNTER — Other Ambulatory Visit: Payer: Self-pay | Admitting: Family Medicine

## 2018-05-14 ENCOUNTER — Ambulatory Visit (INDEPENDENT_AMBULATORY_CARE_PROVIDER_SITE_OTHER): Payer: No Typology Code available for payment source | Admitting: Family Medicine

## 2018-05-14 ENCOUNTER — Encounter: Payer: Self-pay | Admitting: Family Medicine

## 2018-05-14 VITALS — BP 132/88 | HR 72 | Temp 98.0°F | Resp 14 | Ht 73.0 in | Wt 259.0 lb

## 2018-05-14 DIAGNOSIS — F410 Panic disorder [episodic paroxysmal anxiety] without agoraphobia: Secondary | ICD-10-CM

## 2018-05-14 DIAGNOSIS — Z0000 Encounter for general adult medical examination without abnormal findings: Secondary | ICD-10-CM | POA: Diagnosis not present

## 2018-05-14 MED ORDER — ALPRAZOLAM 0.5 MG PO TABS: 0.5000 mg | ORAL_TABLET | Freq: Every evening | ORAL | 0 refills | Status: DC | PRN

## 2018-05-14 NOTE — Progress Notes (Signed)
Subjective:    Patient ID: Bruce Kemp, male    DOB: January 30, 1986, 32 y.o.   MRN: [id number]  HPI Please see previous office visit.  Patient took Lexapro for 1 week but suffered insomnia and stopped the medication.  He then started Zoloft 50 mg p.o. nightly.  He felt that this medicine helped the anxiety however it caused him to feel lack of motivation.  He felt too "blunted" on the medication.  States that his drive and desire were decreased.  Therefore he weaned himself off the medication.  He states that the current time he is doing well just taking Xanax.  30 tablets seem to last him 6 months.  Therefore he is taking approximately 1 pill every 6 days.  He uses it sparingly as needed for breakthrough panic attacks and he feels comfortable managing his anxiety this way at the present time.  Currently he denies any depression or anhedonia or mania.  He is not using alcohol or using recreational drugs or smoking.  He had lab work obtained at his employment physical earlier this year in August which I reviewed.  It was significant for a fasting blood sugar of 106.  He also had an LDL cholesterol of 144.  His hemoglobin A1c was 5.3.  His blood pressure today is borderline at 132/88 Past Medical History:  Diagnosis Date  . Anxiety   . Asthma   . GERD (gastroesophageal reflux disease)    No past surgical history on file. Current Outpatient Medications on File Prior to Visit  Medication Sig Dispense Refill  . etodolac (LODINE) 500 MG tablet Take 500 mg by mouth 2 (two) times daily.      No current facility-administered medications on file prior to visit.    No Known Allergies Social History   Socioeconomic History  . Marital status: Single    Spouse name: Not on file  . Number of children: Not on file  . Years of education: Not on file  . Highest education level: Not on file  Occupational History  . Occupation: Engineer, manufacturing: [employer]  Social Needs  . Financial  resource strain: Not on file  . Food insecurity:    Worry: Not on file    Inability: Not on file  . Transportation needs:    Medical: Not on file    Non-medical: Not on file  Tobacco Use  . Smoking status: Current Some Day Smoker    Types: Cigarettes  . Smokeless tobacco: Never Used  Substance and Sexual Activity  . Alcohol use: Yes    Comment: socially  . Drug use: No  . Sexual activity: Yes    Birth control/protection: Pill  Lifestyle  . Physical activity:    Days per week: Not on file    Minutes per session: Not on file  . Stress: Only a little  Relationships  . Social connections:    Talks on phone: Not on file    Gets together: Not on file    Attends religious service: Not on file    Active member of club or organization: Not on file    Attends meetings of clubs or organizations: Not on file    Relationship status: Not on file  . Intimate partner violence:    Fear of current or ex partner: No    Emotionally abused: No    Physically abused: No    Forced sexual activity: No  Other Topics Concern  . Not on  file  Social History Narrative  . Not on file   Family History  Problem Relation Age of Onset  . Diabetes Father   . Hypertension Maternal Grandmother   . Diabetes Maternal Grandfather   . Hypertension Maternal Grandfather   . Hypertension Paternal Grandmother   . Hypertension Paternal Grandfather   . Kidney cancer Neg Hx   . Kidney disease Neg Hx   . Prostate cancer Neg Hx       Review of Systems  All other systems reviewed and are negative.      Objective:   Physical Exam  Constitutional: He is oriented to person, place, and time. He appears well-developed and well-nourished. No distress.  HENT:  Head: Normocephalic and atraumatic.  Right Ear: External ear normal.  Left Ear: External ear normal.  Nose: Nose normal.  Mouth/Throat: Oropharynx is clear and moist. No oropharyngeal exudate.  Eyes: Pupils are equal, round, and reactive to light.  Conjunctivae and EOM are normal. Right eye exhibits no discharge. Left eye exhibits no discharge. No scleral icterus.  Neck: Normal range of motion. Neck supple. No JVD present. No tracheal deviation present. No thyromegaly present.  Cardiovascular: Normal rate, regular rhythm, normal heart sounds and intact distal pulses. Exam reveals no gallop and no friction rub.  No murmur heard. Pulmonary/Chest: Effort normal and breath sounds normal. No stridor. No respiratory distress. He has no wheezes. He has no rales. He exhibits no tenderness.  Abdominal: Soft. Bowel sounds are normal. He exhibits no distension and no mass. There is no tenderness. There is no rebound and no guarding.  Musculoskeletal: He exhibits no edema or tenderness.  Lymphadenopathy:    He has no cervical adenopathy.  Neurological: He is alert and oriented to person, place, and time. He has normal reflexes. He displays normal reflexes. No cranial nerve deficit. He exhibits normal muscle tone. Coordination normal.  Skin: Skin is warm. No rash noted. He is not diaphoretic. No erythema. No pallor.  Psychiatric: He has a normal mood and affect. His behavior is normal. Judgment and thought content normal.  Vitals reviewed.         Assessment & Plan:  Panic attacks  General medical exam  Physical exam today is significant for borderline blood pressure, elevated blood sugar of 106, and elevated LDL cholesterol of 144.  This is due in part to his elevated weight.  Therefore I have recommended 30 minutes of aerobic exercise daily.  I recommended 15 to 30 pounds of weight loss gradually over the next 6 months.  I recommended a low carbohydrate diet low in saturated fats with increased intake of fruits and vegetables.  I would like to recheck his fasting lab work in 6 months.  I will continue the patient on Xanax 0.5 mg p.o. every 8 hours as needed panic attack.  I encouraged the patient uses medication sparingly.  At the present time I  feel the rate that he is using the Xanax does not represent a problem and I am comfortable with him continuing them at this rate.

## 2018-06-11 ENCOUNTER — Encounter: Payer: No Typology Code available for payment source | Admitting: Family Medicine

## 2018-06-19 ENCOUNTER — Encounter: Payer: Self-pay | Admitting: Registered Nurse

## 2018-06-19 ENCOUNTER — Ambulatory Visit: Payer: Self-pay | Admitting: Registered Nurse

## 2018-06-19 VITALS — BP 131/89 | HR 66 | Temp 98.3°F

## 2018-06-19 DIAGNOSIS — S46912A Strain of unspecified muscle, fascia and tendon at shoulder and upper arm level, left arm, initial encounter: Secondary | ICD-10-CM

## 2018-06-19 MED ORDER — MENTHOL (TOPICAL ANALGESIC) 4 % EX GEL: 1.0000 "application " | Freq: Four times a day (QID) | CUTANEOUS | 0 refills | Status: AC | PRN

## 2018-06-19 NOTE — Progress Notes (Signed)
Subjective:    Patient ID: Bruce Kemp, male    DOB: 03-30-1986, 33 y.o.   MRN: [id number]  33 y/o Caucasian established male pt c/o L shoulder injury 2 days ago while throwing a frisbee. Felt pain and pulling sensation at time of injury. Pain improved today from previous. Limited ROM 2/2 pain. Already taking Etodolac BID prior to injury.   Denied swelling/bruising Left hand dominant   Review of Systems  Constitutional: Negative for activity change, appetite change, chills, diaphoresis, fatigue and fever.  HENT: Negative for trouble swallowing and voice change.   Eyes: Negative for photophobia and visual disturbance.  Respiratory: Negative for cough, shortness of breath, wheezing and stridor.   Cardiovascular: Negative for chest pain.  Gastrointestinal: Negative for diarrhea, nausea and vomiting.  Endocrine: Negative for cold intolerance and heat intolerance.  Musculoskeletal: Positive for arthralgias and myalgias. Negative for back pain, gait problem, joint swelling, neck pain and neck stiffness.  Skin: Negative for color change, pallor, rash and wound.  Allergic/Immunologic: Negative for environmental allergies and food allergies.  Neurological: Positive for weakness. Negative for dizziness, tremors, seizures, syncope, facial asymmetry, speech difficulty, light-headedness, numbness and headaches.  Hematological: Negative for adenopathy. Does not bruise/bleed easily.  Psychiatric/Behavioral: Negative for agitation, confusion and sleep disturbance.       Objective:   Physical Exam Vitals signs and nursing note reviewed.  Constitutional:      Appearance: Normal appearance. He is well-developed and well-groomed. He is not ill-appearing, toxic-appearing or diaphoretic.  HENT:     Head: Normocephalic and atraumatic.     Right Ear: External ear normal.     Left Ear: External ear normal.     Nose: Nose normal.     Mouth/Throat:     Mouth: Mucous membranes are moist.  Eyes:   General: Lids are normal.     Extraocular Movements: Extraocular movements intact.     Conjunctiva/sclera: Conjunctivae normal.     Pupils: Pupils are equal, round, and reactive to light.  Neck:     Musculoskeletal: Normal range of motion and neck supple. No neck rigidity.     Trachea: Trachea normal.  Cardiovascular:     Rate and Rhythm: Normal rate and regular rhythm.     Pulses: Normal pulses.     Heart sounds: Normal heart sounds.  Pulmonary:     Effort: Pulmonary effort is normal.     Breath sounds: Normal breath sounds.  Abdominal:     General: Bowel sounds are normal.     Palpations: Abdomen is soft.  Musculoskeletal:        General: Tenderness and signs of injury present. No swelling or deformity.     Right shoulder: Normal.     Left shoulder: He exhibits decreased range of motion, pain and spasm. He exhibits no tenderness, no bony tenderness, no swelling, no effusion, no crepitus, no deformity, no laceration, normal pulse and normal strength.     Right elbow: Normal.    Left elbow: Normal.     Right hip: Normal.     Left hip: Normal.     Right knee: Normal.     Left knee: Normal.     Cervical back: Normal.     Thoracic back: Normal.     Lumbar back: Normal.     Right upper arm: Normal.     Left upper arm: Normal.       Arms:     Right hand: Normal.     Left hand: Normal.  Right lower leg: No edema.     Left lower leg: No edema.     Comments: External rotation worsens pain left shoulder;   Lymphadenopathy:     Head:     Right side of head: No submental, submandibular, preauricular or posterior auricular adenopathy.     Left side of head: No submental, submandibular, preauricular or posterior auricular adenopathy.     Cervical: No cervical adenopathy.     Right cervical: No superficial cervical adenopathy.    Left cervical: No superficial cervical adenopathy.  Skin:    General: Skin is warm and dry.     Capillary Refill: Capillary refill takes less than 2  seconds.     Coloration: Skin is not ashen, cyanotic, jaundiced, mottled, pale or sallow.     Findings: No bruising, erythema, lesion or rash.  Neurological:     General: No focal deficit present.     Mental Status: He is alert and oriented to person, place, and time.     GCS: GCS eye subscore is 4. GCS verbal subscore is 5. GCS motor subscore is 6.     Cranial Nerves: Cranial nerves are intact. No cranial nerve deficit, dysarthria or facial asymmetry.     Sensory: Sensation is intact.     Motor: Motor function is intact. No weakness, tremor, atrophy, abnormal muscle tone or seizure activity.     Coordination: Coordination is intact.     Gait: Gait is intact. Gait normal.     Comments: In/out of chair and on/off exam table without difficulty gait sure and steady in hallway  Psychiatric:        Mood and Affect: Mood normal.        Speech: Speech normal.        Behavior: Behavior normal. Behavior is cooperative.        Thought Content: Thought content normal.        Judgment: Judgment normal.       External rotation and Empty beer can + left  Shoulder and most pain with pronation left hand in left shoulder; full AROM  Equal bilaterally not TTP; trapezius tight bilaterally; negative neers, atchley scratch; bilaterally strength equal 5/5 upper extremities    Assessment & Plan:  A-left rotator cuff strain acute initial visit  P-continue etodolac BID prn at home, biofreeze gel topical QID prn, cryotherapy 15 minutes QID and gentle AROM/stretches 10 seconds TID per [facility] handout rehab after rotator cuff surgery printed and given to patient along with shoulder strain and rotator cuff injury  Discussed may take 4-6 weeks to completely heal follow up in 2 weeks sooner if worsening despite plan of care.  Avoid pull start lawn mower, frisbee golf or bat/club sports and overhead lifting at gym until symptoms resolved.Home stretches demonstrated to patient-e.g. Arm circles, walking up wall, chest  stretches, neck AROM, chin tucks. . Self massage or professional prn, foam roller use or tennis/racquetball.  Consider physical therapy referral if no improvement with prescribed therapy from [facility] and/or chiropractic care and orthopedics.  Ensure ergonomics correct desk at work avoid repetitive motions if possible/holding phone/laptop in hand use desk/stand and/or break up lifting items into smaller loads/weights.  Patient was instructed to rest, ice, and ROM exercises.  Activity as tolerated Patient verbalized understanding information/instructions, agreed with plan of care and had no further questions at this time.

## 2018-06-19 NOTE — Patient Instructions (Signed)
Rotator Cuff Tear  A rotator cuff tear is a partial or complete tear of the cord-like bands (tendons) that connect muscle to bone in the rotator cuff. The rotator cuff is a group of muscles and tendons that surround the shoulder joint and keep the upper arm bone (humerus) in the shoulder socket. The tear can occur suddenly (acute tear) or can develop over a long period of time (chronic tear). What are the causes? Acute tears may be caused by:  A fall, especially on an outstretched arm.  Lifting very heavy objects with a jerking motion. Chronic tears may be caused by overuse of the muscles. This may happen in sports, physical work, or activities in which your arm repeatedly moves over your head. What increases the risk? This condition is more likely to occur in:  Athletes and workers who frequently use their shoulder or reach over their heads. This may include activities such as: ? Tennis. ? Baseball and softball. ? Swimming and rowing. ? Weightlifting. ? Construction work. ? Painting.  People who smoke.  Older people who have arthritis or poor blood supply. These can make the muscles and tendons weaker. What are the signs or symptoms? Symptoms of this condition depend on the type and severity of the injury:  An acute tear may include a sudden tearing feeling, followed by severe pain that goes from your upper shoulder, down your arm, and toward your elbow.  A chronic tear includes a gradual weakness and decreased shoulder motion as the pain gets worse. The pain is usually worse at night. Both types may have symptoms such as:  Pain that spreads (radiates) from the shoulder to the upper arm.  Swelling and tenderness in front of the shoulder.  Decreased range of motion.  Pain when: ? Reaching, pulling, or lifting the arm above the head. ? Lowering the arm from above the head.  Not being able to raise your arm out to the side.  Difficulty placing the arm behind your back. How  is this diagnosed? This condition is diagnosed with a medical history and physical exam. Imaging tests may also be done, including:  X-rays.  MRI.  Ultrasound.  CT or MR arthrogram. During this test, a contrast material is injected into your shoulder and then images are taken. How is this treated? Treatment for this condition depends on the type and severity of the condition. In less severe cases, treatment may include:  Rest. This may be done with a sling that holds the shoulder still (immobilization). Your health care provider may also recommend avoiding activities that involve lifting your arm over your head.  Icing the shoulder.  Anti-inflammatory medicines, such as aspirin or ibuprofen.  Strengthening and stretching exercises. Your health care provider may recommend specific exercises to improve your range of motion and strengthen your shoulder. In more severe cases, treatment may include:  Physical therapy.  Steroid injections.  Surgery. Follow these instructions at home: Managing pain, stiffness, and swelling  If directed, put ice on the injured area. ? If you have a removable sling, remove it as told by your health care provider. ? Put ice in a plastic bag. ? Place a towel between your skin and the bag. ? Leave the ice on for 20 minutes, 2-3 times a day.  Raise (elevate) the injured area above the level of your heart while you are lying down.  Find a comfortable sleeping position or sleep on a recliner, if available.  Move your fingers often to avoid stiffness   and to lessen swelling.  Once the swelling has gone down, your health care provider may direct you to apply heat to relax the muscles. Use the heat source that your health care provider recommends, such as a moist heat pack or a heating pad. ? Place a towel between your skin and the heat source. ? Leave the heat on for 20-30 minutes. ? Remove the heat if your skin turns bright red. This is especially  important if you are unable to feel pain, heat, or cold. You may have a greater risk of getting burned. If you have a sling:  Wear the sling as told by your health care provider. Remove it only as told by your health care provider.  Loosen the sling if your fingers tingle, become numb, or turn cold and blue.  Keep the sling clean.  If the sling is not waterproof: ? Do not let it get wet. ? Cover it with a watertight covering when you take a bath or a shower. Driving  Do not drive or use heavy machinery while taking prescription pain medicine.  Ask your health care provider when it is safe to drive if you have a sling on your arm. Activity  Rest your shoulder as told by your health care provider.  Return to your normal activities as told by your health care provider. Ask your health care provider what activities are safe for you.  Do any exercises or stretches as told by your health care provider. General instructions  Do not use any products that contain nicotine or tobacco, such as cigarettes and e-cigarettes. If you need help quitting, ask your health care provider.  Take over-the-counter and prescription medicines only as told by your health care provider.  Keep all follow-up visits as told by your health care provider. This is important. Contact a health care provider if:  Your pain gets worse.  You have new pain in your arm, hands, or fingers.  Medicine does not help your pain. Get help right away if:  Your arm, hand, or fingers are numb or tingling.  Your arm, hand, or fingers are swollen or painful or they turn white or blue.  Your hand or fingers on your injured arm are colder than your other hand. Summary  A rotator cuff tear is a partial or complete tear of the cord-like bands (tendons) that connect muscle to bone in the rotator cuff.  The tear can occur suddenly (acute tear) or can develop over a long period of time (chronic tear).  Treatment generally  includes rest, anti-inflammatory medicines, and icing. In some cases, physical therapy and steroid injections may be needed. In severe cases, surgery may be needed. This information is not intended to replace advice given to you by your health care provider. Make sure you discuss any questions you have with your health care provider. Document Released: 05/27/2000 Document Revised: 08/15/2016 Document Reviewed: 08/15/2016 Elsevier Interactive Patient Education  2019 [place] Cuff Tear Rehab After Surgery Ask your health care provider which exercises are safe for you. Do exercises exactly as told by your health care provider and adjust them as directed. It is normal to feel mild stretching, pulling, tightness, or discomfort as you do these exercises, but you should stop right away if you feel sudden pain or your pain gets worse. Do not begin these exercises until told by your health care provider. Stretching and range of motion exercises These exercises warm up your muscles and joints and improve the  movement and flexibility of your shoulder. These exercises also help to relieve pain, numbness, and tingling. Exercise A: Pendulum  1. Stand near a wall or a surface that you can hold onto for balance. 2. Bend at the waist and let your left / right arm hang straight down. Use your other arm to keep your balance. 3. Relax your arm and shoulder muscles, and move your hips and your trunk so your left / right arm swings freely. Your arm should swing because of the motion of your body, not because you are using your arm or shoulder muscles. 4. Keep moving so your arm swings in the following directions, as told by your health care provider: ? Side to side. ? Forward and backward. ? In clockwise and counterclockwise circles. Repeat ___3_______ times, or for _____10_____ seconds per direction. Complete this exercise ____3______ times a day. Exercise B: Flexion, seated  1. Sit in a stable chair so  your left / right forearm can rest on a flat surface. Your elbow should rest at a height that keeps your upper arm next to your body. 2. Keeping your shoulder relaxed, lean forward at the waist and let your hand slide forward. Stop when you feel a stretch in your shoulder, or when you reach the angle that is recommended by your health care provider. 3. Hold for ____10______ seconds. 4. Slowly return to the starting position. Repeat ___3_______ times. Complete this exercise ____3______ times a day. Exercise C: Flexion, standing  1. Stand and hold a broomstick, a cane, or a similar object. Place your hands a little more than shoulder-width apart on the object. Your left / right hand should be palm-up, and your other hand should be palm-down. 2. Push the stick down with your healthy arm to raise your left / right arm in front of your body, and then over your head. Use your other hand to help move the stick. Stop when you feel a stretch in your shoulder, or when you reach the angle that is recommended by your health care provider. ? Avoid shrugging your shoulder while you raise your arm. Keep your shoulder blade tucked down toward your spine. ? Keep your left / right shoulder muscles relaxed. 3. Hold for _______10___ seconds. 4. Slowly return to the starting position. Repeat ______3____ times. Complete this exercise ______3____ times a day. Exercise D: Abduction, supine  1. Lie on your back and hold a broomstick, a cane, or a similar object. Place your hands a little more than shoulder-width apart on the object. Your left / right hand should be palm-up, and your other hand should be palm-down. 2. Push the stick to raise your left / right arm out to your side and then over your head. Use your other hand to help move the stick. Stop when you feel a stretch in your shoulder, or when you reach the angle that is recommended by your health care provider. ? Avoid shrugging your shoulder while you raise your  arm. Keep your shoulder blade tucked down toward your spine. 3. Hold for _____10_____ seconds. 4. Slowly return to the starting position. Repeat _____3_____ times. Complete this exercise _____3_____ times a day. Exercise E: Shoulder flexion, active-assisted  1. Lie on your back. You may bend your knees for comfort. 2. Hold a broomstick, a cane, or a similar object so your hands are about shoulder-width apart. Your palms should face toward your feet. 3. Raise your left / right arm over your head and behind your head, toward  the floor. Use your other hand to help you do this. Stop when you feel a gentle stretch in your shoulder, or when you reach the angle that is recommended by your health care provider. 4. Hold for ___10______ seconds. 5. Use the broomstick and your other arm to help you return your left / right arm to the starting position. Repeat ____3______ times. Complete this exercise _____3_____ times a day. Exercise F: External rotation  1. Sit in a stable chair without armrests, or stand. 2. Tuck a soft object, such as a folded towel or a small ball, under your left / right upper arm. 3. Hold a broomstick, a cane, or a similar object so your palms face down, toward the floor. Bend your elbows to an "L" shape (90 degrees), and keep your hands about shoulder-width apart. 4. Straighten your healthy arm and push the broomstick across your body, toward your left / right side. Keep your left / right arm bent. This will rotate your left / right forearm away from your body. 5. Hold for _____10_____ seconds. 6. Slowly return to the starting position. Repeat ____3______ times. Complete this exercise _____3_____ times a day. Strengthening exercises These exercises build strength and endurance in your shoulder. Endurance is the ability to use your muscles for a long time, even after they get tired. Exercise G: Shoulder flexion, isometric  1. Stand or sit about 4-6 inches (10-15 cm) away from a  wall with your left / right side facing the wall. 2. Gently make a fist and place your left / right hand on the wall so the top of your fist touches the wall. 3. With your left / right elbow straight, gently press the top of your fist into the wall. Gradually increase the pressure until you are pressing as hard as you can without shrugging your shoulder. 4. Hold for ____10______ seconds. 5. Slowly release the tension and relax your muscles completely before you repeat the exercise. Repeat ___3_____ times. Complete this exercise ___3_______ times a day. Exercise H: Shoulder abduction, isometric  1. Stand or sit about 4-6 inches (10-15 cm) away from a wall with your right/left side facing the wall. 2. Bend your left / right elbow and gently press your elbow into the wall as if you are trying to move your arm out to your side. Increase the pressure gradually until you are pressing as hard as you can without shrugging your shoulder. 3. Hold for _____10_____ seconds. 4. Slowly release the tension and relax your muscles completely before repeating the exercise. Repeat ____3______ times. Complete this exercise ____3______ times a day. Exercise I: Internal rotation, isometric  1. Stand or sit in a doorway, facing the door frame. 2. Bend your left / right elbow and place the palm of your hand against the door frame. Only your palm should be touching the frame. Keep your upper arm at your side. 3. Gently press your hand into the door frame, as if you are trying to push your arm toward your abdomen. Do not let your wrist bend. ? Avoid shrugging your shoulder while you press your hand into the door frame. Keep your shoulder blade tucked down toward the middle of your back. 4. Hold for _____10_____ seconds. 5. Slowly release the tension, and relax your muscles completely before you repeat the exercise. Repeat ______3____ times. Complete this exercise ____3______ times a day. Exercise J: External rotation,  isometric  1. Stand or sit in a doorway, facing the door frame. 2. [place] your  left / right elbow and place the back of your wrist against the door frame. Only the back of your wrist should be touching the frame. Keep your upper arm at your side. 3. Gently press your wrist against the door frame, as if you are trying to push your arm away from your abdomen. ? Avoid shrugging your shoulder while you press your wrist into the door frame. Keep your shoulder blade tucked down toward the middle of your back. 4. Hold for _____10_____ seconds. 5. Slowly release the tension, and relax your muscles completely before you repeat the exercise. Repeat _____3____ times. Complete this exercise ____3_____ times a day. This information is not intended to replace advice given to you by your health care provider. Make sure you discuss any questions you have with your health care provider. Document Released: 05/30/2005 Document Revised: 02/04/2016 Document Reviewed: 06/13/2015 Elsevier Interactive Patient Education  2019 [employer] Shoulder Sprain  A shoulder sprain is a partial or complete tear in one of the tough, fiber-like tissues (ligaments) in the shoulder. The ligaments in the shoulder help to hold the shoulder in place. What are the causes? This condition may be caused by:  A fall.  A hit to the shoulder.  A twist of the arm. What increases the risk? You are more likely to develop this condition if you:  Play sports.  Have problems with balance or coordination. What are the signs or symptoms? Symptoms of this condition include:  Pain when moving the shoulder.  Limited ability to move the shoulder.  Swelling and tenderness on top of the shoulder.  Warmth in the shoulder.  A change in the shape of the shoulder.  Redness or bruising on the shoulder. How is this diagnosed? This condition is diagnosed with:  A physical exam. During the exam, you may be asked to do simple exercises with  your shoulder.  Imaging tests such as X-rays, MRI, or a CT scan. These tests can show how severe the sprain is. How is this treated? This condition may be treated with:  Rest.  Pain medicine.  Ice.  A sling or brace. This is used to keep the arm still while the shoulder is healing.  Physical therapy or rehabilitation exercises. These help to improve the range of motion and strength of the shoulder.  Surgery (rare). Surgery may be needed if the sprain caused a joint to become unstable. Surgery may also be needed to reduce pain. Some people may develop ongoing shoulder pain or lose some range of motion in the shoulder. However, most people do not develop long-term problems. Follow these instructions at home:  If you have a sling or brace:  Wear the sling or brace as told by your health care provider. Remove it only as told by your health care provider.  Loosen the sling or brace if your fingers tingle, become numb, or turn cold and blue.  Keep the sling or brace clean.  If the sling or brace is not waterproof: ? Do not let it get wet. ? Cover it with a watertight covering when you take a bath or shower. Activity  Rest your shoulder.  Move your arm only as much as told by your health care provider, but move your hand and fingers often to prevent stiffness and swelling.  Return to your normal activities as told by your health care provider. Ask your health care provider what activities are safe for you.  Ask your health care provider when it is safe  for you to drive if you have a sling or brace on your shoulder.  If you were shown how to do any exercises, do them as told by your health care provider. General instructions  If directed, put ice on the affected area. ? Put ice in a plastic bag. ? Place a towel between your skin and the bag. ? Leave the ice on for 20 minutes, 2-3 times a day.  Take over-the-counter and prescription medicines only as told by your health care  provider.  Do not use any products that contain nicotine or tobacco, such as cigarettes, e-cigarettes, and chewing tobacco. These can delay healing. If you need help quitting, ask your health care provider.  Keep all follow-up visits as told by your health care provider. This is important. Contact a health care provider if:  Your pain gets worse.  Your pain is not relieved with medicines.  You have increased redness or swelling. Get help right away if:  You have a fever.  You cannot move your arm or shoulder.  You develop severe numbness or tingling in your arm, hand, or fingers.  Your arm, hand, or fingers feel cold and turn blue, white, or gray. Summary  A shoulder sprain is a partial or complete tear in one of the tough, fiber-like tissues (ligaments) in the shoulder.  This condition may be caused by a fall, a hit to the shoulder, or a twist of the arm.  Treatment usually includes rest, ice, and pain medicine as needed.  If you have a sling or brace, wear it as told by your health care provider. Remove it only as told by your health care provider. This information is not intended to replace advice given to you by your health care provider. Make sure you discuss any questions you have with your health care provider. Document Released: 10/16/2008 Document Revised: 11/03/2017 Document Reviewed: 11/03/2017 Elsevier Interactive Patient Education  2019 [employer].

## 2018-07-19 ENCOUNTER — Ambulatory Visit: Payer: Self-pay | Admitting: *Deleted

## 2018-07-19 VITALS — Ht 73.0 in | Wt 260.0 lb

## 2018-07-19 DIAGNOSIS — Z0000 Encounter for general adult medical examination without abnormal findings: Secondary | ICD-10-CM

## 2018-07-19 NOTE — Progress Notes (Signed)
[employer] insurance premium discount evaluation: Labs Drawn. Repl[employer] ROI form signed. Tobacco Free Attestation form signed.  Forms placed in paper chart. Okay to route to pcp per pt.  Plan to recheck BP tomorrow. Pt very anxious regarding blood draw.  Redraw attempt tomorrow morning scheduled. Attempted x2 today. L AC collapsed, R AC clotted off. Pt reports he plans to take Xanax prior to appt tomorrow to help with anticipatory anxiety.

## 2018-07-20 ENCOUNTER — Ambulatory Visit: Payer: Self-pay | Admitting: *Deleted

## 2018-07-20 DIAGNOSIS — Z0000 Encounter for general adult medical examination without abnormal findings: Secondary | ICD-10-CM

## 2018-07-20 NOTE — Progress Notes (Signed)
Attempt for lab redraw as unsuccessful x2 attempts yesterday.

## 2018-07-21 LAB — CMP12+LP+TP+TSH+6AC+CBC/D/PLT
ALBUMIN: 4.9 g/dL (ref 4.0–5.0)
ALT: 73 IU/L — AB (ref 0–44)
AST: 48 IU/L — ABNORMAL HIGH (ref 0–40)
Albumin/Globulin Ratio: 1.8 (ref 1.2–2.2)
Alkaline Phosphatase: 57 IU/L (ref 39–117)
BASOS: 1 %
BUN/Creatinine Ratio: 15 (ref 9–20)
BUN: 13 mg/dL (ref 6–20)
Basophils Absolute: 0 10*3/uL (ref 0.0–0.2)
Bilirubin Total: 1.2 mg/dL (ref 0.0–1.2)
CHOLESTEROL TOTAL: 262 mg/dL — AB (ref 100–199)
Calcium: 10 mg/dL (ref 8.7–10.2)
Chloride: 97 mmol/L (ref 96–106)
Chol/HDL Ratio: 6 ratio — ABNORMAL HIGH (ref 0.0–5.0)
Creatinine, Ser: 0.84 mg/dL (ref 0.76–1.27)
EOS (ABSOLUTE): 0.3 10*3/uL (ref 0.0–0.4)
Eos: 5 %
Estimated CHD Risk: 1.3 times avg. — ABNORMAL HIGH (ref 0.0–1.0)
Free Thyroxine Index: 1.7 (ref 1.2–4.9)
GFR calc Af Amer: 134 mL/min/{1.73_m2} (ref >59)
GFR calc non Af Amer: 116 mL/min/{1.73_m2} (ref >59)
GGT: 41 IU/L (ref 0–65)
Globulin, Total: 2.8 g/dL (ref 1.5–4.5)
Glucose: 82 mg/dL (ref 65–99)
HDL: 44 mg/dL (ref >39)
Hematocrit: 48.9 % (ref 37.5–51.0)
Hemoglobin: 17.2 g/dL (ref 13.0–17.7)
Immature Grans (Abs): 0 10*3/uL (ref 0.0–0.1)
Immature Granulocytes: 0 %
Iron: 143 ug/dL (ref 38–169)
LDH: 230 IU/L — ABNORMAL HIGH (ref 121–224)
LDL Calculated: 177 mg/dL — ABNORMAL HIGH (ref 0–99)
Lymphocytes Absolute: 2.4 10*3/uL (ref 0.7–3.1)
Lymphs: 38 %
MCH: 31.3 pg (ref 26.6–33.0)
MCHC: 35.2 g/dL (ref 31.5–35.7)
MCV: 89 fL (ref 79–97)
MONOS ABS: 0.4 10*3/uL (ref 0.1–0.9)
Monocytes: 6 %
Neutrophils Absolute: 3.2 10*3/uL (ref 1.4–7.0)
Neutrophils: 50 %
Phosphorus: 2.7 mg/dL — ABNORMAL LOW (ref 2.8–4.1)
Platelets: 305 10*3/uL (ref 150–450)
Potassium: 4.4 mmol/L (ref 3.5–5.2)
RBC: 5.49 x10E6/uL (ref 4.14–5.80)
RDW: 12.5 % (ref 11.6–15.4)
SODIUM: 137 mmol/L (ref 134–144)
T3 Uptake Ratio: 25 % (ref 24–39)
T4, Total: 6.7 ug/dL (ref 4.5–12.0)
TSH: 1.6 u[IU]/mL (ref 0.450–4.500)
Total Protein: 7.7 g/dL (ref 6.0–8.5)
Triglycerides: 205 mg/dL — ABNORMAL HIGH (ref 0–149)
Uric Acid: 4.9 mg/dL (ref 3.7–8.6)
VLDL Cholesterol Cal: 41 mg/dL — ABNORMAL HIGH (ref 5–40)
WBC: 6.4 10*3/uL (ref 3.4–10.8)

## 2018-07-21 LAB — HGB A1C W/O EAG: Hgb A1c MFr Bld: 5.4 % (ref 4.8–5.6)

## 2018-11-07 ENCOUNTER — Other Ambulatory Visit: Payer: Self-pay | Admitting: Family Medicine

## 2018-11-07 NOTE — Telephone Encounter (Signed)
Pt is requesting refill on Xanax   LOV: 05/14/18  LRF:   05/14/18

## 2018-11-08 MED ORDER — ALPRAZOLAM 0.5 MG PO TABS: 0.5000 mg | ORAL_TABLET | Freq: Every evening | ORAL | 0 refills | Status: DC | PRN

## 2018-11-15 ENCOUNTER — Encounter: Payer: Self-pay | Admitting: Registered Nurse

## 2018-11-15 ENCOUNTER — Other Ambulatory Visit: Payer: Self-pay | Admitting: Registered Nurse

## 2018-11-15 NOTE — Telephone Encounter (Signed)
Lab completed Feb 2020 renal function and CBC and BP stable from visits in previous 6 months.  Refill electronic Rx sent to his pharmacy of choice.  Patient not allowed in building by employer as currently assigned remote work from home.  Will follow up when patient returns to onsite workcenter.  Patient to follow up with PCM prn.

## 2019-05-16 ENCOUNTER — Telehealth: Payer: Self-pay | Admitting: *Deleted

## 2019-05-16 DIAGNOSIS — Z20822 Contact with and (suspected) exposure to covid-19: Secondary | ICD-10-CM

## 2019-05-16 NOTE — Telephone Encounter (Signed)
Pt made HR manager aware of sinusitis sx that began Sunday 11/29. RN spoke with pt by phone today. C/o frontal sinus pressure with pressure behind eyes and nasal congestion. Also with fatigue Sunday-Tuesday. Reports he called [facility] Monday and was told it was cold sx only, no covid testing indicated per them. Gave 5 days Prednisone but only took 2 days since he was feeling better after that. Also was using Mucinex FastMax until yesterday morning. Feels well today. No c/o.   No known covid exposures. Due to previous sx, do advise covid testing. Plan for testing on Day 6 of sx, tomorrow Friday 05/17/19. Pt made aware of same and given instructions for testing at [place]. HR manager made aware of pending testing and recommended quarantine.  Pt also asking if he can get tested at [facility] drive up testing if it is more convenient. Advised him he can attend either but if through [facility], he will become responsible for alerting clinic staff and HR to test result. He verbalizes understanding and agreement with plan of care. Will inform RN of final testing site on f/u call on Monday 05/20/19.

## 2019-05-16 NOTE — Telephone Encounter (Signed)
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  Please ensure ER precautions briefed to patient also.

## 2019-05-20 NOTE — Telephone Encounter (Signed)
Spoke with pt over phone. He reports feeling well. Denies any current sx. Sts all sx resolved on Thursday 12/3 when RN and pt last spoke.  Pt had rapid Covid test performed at [facility] on 05/17/19 and emailed RN and HR manager negative results. Bruce Kemp will end tomorrow and he is eligible to return to work onsite Wed 05/22/19. Currently working remotely. Next scheduled day on site Thursday 12/10. ER precautions were reviewed with pt during RN phone call on 12/3. Pt verbalized understanding at that time.

## 2019-05-20 NOTE — Telephone Encounter (Signed)
Noted agree with plan of care 

## 2019-05-31 ENCOUNTER — Other Ambulatory Visit: Payer: Self-pay | Admitting: Family Medicine

## 2019-05-31 MED ORDER — ALPRAZOLAM 0.5 MG PO TABS: 0.5000 mg | ORAL_TABLET | Freq: Every evening | ORAL | 0 refills | Status: DC | PRN

## 2019-05-31 NOTE — Telephone Encounter (Signed)
Pt is requesting refill on Xanax   LOV: 05/14/18  LRF:   11/08/18

## 2019-08-26 ENCOUNTER — Other Ambulatory Visit: Payer: Self-pay | Admitting: Registered Nurse

## 2019-08-27 NOTE — Telephone Encounter (Signed)
Verbal order for fasting exec panel since last labs >77yr ago. Pt out of the office today, returning tomorrow. Email sent requesting date/time preference for lab appt.

## 2019-08-27 NOTE — Telephone Encounter (Signed)
Noted patient out of office today and email sent to patient by RN Hildred Alamin to follow up scheduling requested labs/ensure patient requested refill.

## 2019-08-29 NOTE — Telephone Encounter (Signed)
[facility] refill electronic Rx entered for patient until he can complete labs.

## 2019-08-30 ENCOUNTER — Ambulatory Visit: Payer: Self-pay | Admitting: *Deleted

## 2019-08-30 ENCOUNTER — Other Ambulatory Visit: Payer: Self-pay

## 2019-08-30 VITALS — BP 130/82 | HR 68

## 2019-08-30 DIAGNOSIS — Z0000 Encounter for general adult medical examination without abnormal findings: Secondary | ICD-10-CM

## 2019-08-30 DIAGNOSIS — Z79899 Other long term (current) drug therapy: Secondary | ICD-10-CM

## 2019-08-30 NOTE — Telephone Encounter (Signed)
Labs completed this morning.

## 2019-08-30 NOTE — Progress Notes (Signed)
Fasting annual labs.

## 2019-08-30 NOTE — Telephone Encounter (Signed)
Noted labs drawn this morning

## 2019-08-31 LAB — CMP12+LP+TP+TSH+6AC+CBC/D/PLT
ALT: 84 IU/L — ABNORMAL HIGH (ref 0–44)
AST: 49 IU/L — ABNORMAL HIGH (ref 0–40)
Albumin/Globulin Ratio: 1.7 (ref 1.2–2.2)
Albumin: 4.5 g/dL (ref 4.0–5.0)
Alkaline Phosphatase: 59 IU/L (ref 39–117)
BUN/Creatinine Ratio: 13 (ref 9–20)
BUN: 12 mg/dL (ref 6–20)
Basophils Absolute: 0 10*3/uL (ref 0.0–0.2)
Basos: 0 %
Bilirubin Total: 0.9 mg/dL (ref 0.0–1.2)
Calcium: 9.3 mg/dL (ref 8.7–10.2)
Chloride: 99 mmol/L (ref 96–106)
Chol/HDL Ratio: 5 ratio (ref 0.0–5.0)
Cholesterol, Total: 223 mg/dL — ABNORMAL HIGH (ref 100–199)
Creatinine, Ser: 0.9 mg/dL (ref 0.76–1.27)
EOS (ABSOLUTE): 0.4 10*3/uL (ref 0.0–0.4)
Eos: 7 %
Estimated CHD Risk: 1 times avg. (ref 0.0–1.0)
Free Thyroxine Index: 1.5 (ref 1.2–4.9)
GFR calc Af Amer: 129 mL/min/{1.73_m2} (ref >59)
GFR calc non Af Amer: 112 mL/min/{1.73_m2} (ref >59)
GGT: 42 IU/L (ref 0–65)
Globulin, Total: 2.6 g/dL (ref 1.5–4.5)
Glucose: 96 mg/dL (ref 65–99)
HDL: 45 mg/dL (ref >39)
Hematocrit: 48 % (ref 37.5–51.0)
Hemoglobin: 16.5 g/dL (ref 13.0–17.7)
Immature Grans (Abs): 0 10*3/uL (ref 0.0–0.1)
Immature Granulocytes: 0 %
Iron: 99 ug/dL (ref 38–169)
LDH: 224 IU/L (ref 121–224)
LDL Chol Calc (NIH): 160 mg/dL — ABNORMAL HIGH (ref 0–99)
Lymphocytes Absolute: 2.1 10*3/uL (ref 0.7–3.1)
Lymphs: 37 %
MCH: 31.7 pg (ref 26.6–33.0)
MCHC: 34.4 g/dL (ref 31.5–35.7)
MCV: 92 fL (ref 79–97)
Monocytes Absolute: 0.6 10*3/uL (ref 0.1–0.9)
Monocytes: 10 %
Neutrophils Absolute: 2.5 10*3/uL (ref 1.4–7.0)
Neutrophils: 46 %
Phosphorus: 3.1 mg/dL (ref 2.8–4.1)
Platelets: 297 10*3/uL (ref 150–450)
Potassium: 4.7 mmol/L (ref 3.5–5.2)
RBC: 5.2 x10E6/uL (ref 4.14–5.80)
RDW: 12.6 % (ref 11.6–15.4)
Sodium: 136 mmol/L (ref 134–144)
T3 Uptake Ratio: 27 % (ref 24–39)
T4, Total: 5.7 ug/dL (ref 4.5–12.0)
TSH: 1.33 u[IU]/mL (ref 0.450–4.500)
Total Protein: 7.1 g/dL (ref 6.0–8.5)
Triglycerides: 98 mg/dL (ref 0–149)
Uric Acid: 3.9 mg/dL (ref 3.8–8.4)
VLDL Cholesterol Cal: 18 mg/dL (ref 5–40)
WBC: 5.5 10*3/uL (ref 3.4–10.8)

## 2019-08-31 LAB — HGB A1C W/O EAG: Hgb A1c MFr Bld: 5.4 % (ref 4.8–5.6)

## 2019-09-02 NOTE — Telephone Encounter (Signed)
My chart message sent to patient Bruce Kemp slightly worsening consider decreasing etodolac dose if there has not been weight gain over the past year or alcohol/dietary supplement intake.  Awaiting reply from patient

## 2019-09-10 ENCOUNTER — Telehealth: Payer: Self-pay | Admitting: Registered Nurse

## 2019-09-10 ENCOUNTER — Encounter: Payer: Self-pay | Admitting: Registered Nurse

## 2019-09-10 NOTE — Telephone Encounter (Signed)
Reached patient via telephone he had not reviewed lab results in mychart and stated currently in a meeting and will call back once meeting finished to review labs.

## 2019-09-10 NOTE — Telephone Encounter (Signed)
New tcon opened patient reached via telephone today stated unable to talk at the moment in a meeting.  Will call back when meeting ended to go over lab results he did not see my chart message.

## 2019-09-12 ENCOUNTER — Telehealth: Payer: Self-pay | Admitting: Registered Nurse

## 2019-09-12 MED ORDER — ETODOLAC 500 MG PO TABS: 500.0000 mg | ORAL_TABLET | Freq: Two times a day (BID) | ORAL | 3 refills | Status: AC

## 2019-09-12 NOTE — Telephone Encounter (Signed)
Pt did not return call to RN. Clinic closed yesterday per regular Wednesday schedule. No messages from pt noted. Will attempt f/u again this afternoon.

## 2019-09-12 NOTE — Telephone Encounter (Signed)
Patient returned call to clinic.  He reported work had been busy.  He hasn't been able to read his my chart and needs password reset.  Discussed RN Hildred Alamin can assist him after I finish reviewing his lab results with him.  Discussed male executive panel in detail with patient and compared to previous 2 years.  Results for Bruce Kemp, Bruce Kemp (MRN [id number]) as of 09/12/2019 21:50  Ref. Range 01/23/2018 14:15 07/20/2018 08:50 08/30/2019 09:05  Sodium Latest Ref Range: 134 - 144 mmol/L 142 137 136  Potassium Latest Ref Range: 3.5 - 5.2 mmol/L 4.3 4.4 4.7  Chloride Latest Ref Range: 96 - 106 mmol/L 104 97 99  Glucose Latest Ref Range: 65 - 99 mg/dL 106 (H) 82 96  BUN Latest Ref Range: 6 - 20 mg/dL 15 13 12   Creatinine Latest Ref Range: 0.76 - 1.27 mg/dL 0.89 0.84 0.90  Calcium Latest Ref Range: 8.7 - 10.2 mg/dL 9.7 10.0 9.3  BUN/Creatinine Ratio Latest Ref Range: 9 - 20  17 15 13   Phosphorus Latest Ref Range: 2.8 - 4.1 mg/dL 3.8 2.7 (L) 3.1  Alkaline Phosphatase Latest Ref Range: 39 - 117 IU/L 54 57 59  Albumin Latest Ref Range: 4.0 - 5.0 g/dL 4.7 4.9 4.5  Albumin/Globulin Ratio Latest Ref Range: 1.2 - 2.2  1.9 1.8 1.7  Uric Acid Latest Ref Range: 3.8 - 8.4 mg/dL 4.3 4.9 3.9  AST Latest Ref Range: 0 - 40 IU/L 33 48 (H) 49 (H)  ALT Latest Ref Range: 0 - 44 IU/L 35 73 (H) 84 (H)  Total Protein Latest Ref Range: 6.0 - 8.5 g/dL 7.2 7.7 7.1  Total Bilirubin Latest Ref Range: 0.0 - 1.2 mg/dL 1.0 1.2 0.9  GGT Latest Ref Range: 0 - 65 IU/L 27 41 42  GFR, Est Non African American Latest Ref Range: >59 mL/min/1.73 114 116 112  GFR, Est African American Latest Ref Range: >59 mL/min/1.73 132 134 129  Estimated CHD Risk Latest Ref Range: 0.0 - 1.0 times avg. 1.2 (H) 1.3 (H) 1.0  LDH Latest Ref Range: 121 - 224 IU/L 183 230 (H) 224  Total CHOL/HDL Ratio Latest Ref Range: 0.0 - 5.0 ratio 5.7 (H) 6.0 (H) 5.0  Cholesterol, Total Latest Ref Range: 100 - 199 mg/dL 232 (H) 262 (H) 223 (H)  HDL Cholesterol Latest Ref Range: >39  mg/dL 41 44 45  LDL (calc) Latest Ref Range: 0 - 99 mg/dL 144 (H) 177 (H)   Triglycerides Latest Ref Range: 0 - 149 mg/dL 237 (H) 205 (H) 98  VLDL Cholesterol Cal Latest Ref Range: 5 - 40 mg/dL 47 (H) 41 (H) 18  LDL Chol Calc (NIH) Latest Ref Range: 0 - 99 mg/dL   160 (H)  Iron Latest Ref Range: 38 - 169 ug/dL 123 143 99  Globulin, Total Latest Ref Range: 1.5 - 4.5 g/dL 2.5 2.8 2.6  WBC Latest Ref Range: 3.4 - 10.8 x10E3/uL 5.6 6.4 5.5  RBC Latest Ref Range: 4.14 - 5.80 x10E6/uL 5.16 5.49 5.20  Hemoglobin Latest Ref Range: 13.0 - 17.7 g/dL 16.0 17.2 16.5  HCT Latest Ref Range: 37.5 - 51.0 % 46.9 48.9 48.0  MCV Latest Ref Range: 79 - 97 fL 91 89 92  MCH Latest Ref Range: 26.6 - 33.0 pg 31.0 31.3 31.7  MCHC Latest Ref Range: 31.5 - 35.7 g/dL 34.1 35.2 34.4  RDW Latest Ref Range: 11.6 - 15.4 % 13.2 12.5 12.6  Platelets Latest Ref Range: 150 - 450 x10E3/uL 263 305  297  Neutrophils Latest Ref Range: Not Estab. % 44 50 46  Immature Granulocytes Latest Ref Range: Not Estab. % 0 0 0  NEUT# Latest Ref Range: 1.4 - 7.0 x10E3/uL 2.4 3.2 2.5  Lymphocyte # Latest Ref Range: 0.7 - 3.1 x10E3/uL 2.4 2.4 2.1  Monocytes Absolute Latest Ref Range: 0.1 - 0.9 x10E3/uL 0.4 0.4 0.6  Basophils Absolute Latest Ref Range: 0.0 - 0.2 x10E3/uL 0.0 0.0 0.0  Immature Grans (Abs) Latest Ref Range: 0.0 - 0.1 x10E3/uL 0.0 0.0 0.0  Lymphs Latest Ref Range: Not Estab. % 44 38 37  Monocytes Latest Ref Range: Not Estab. % 7 6 10   Basos Latest Ref Range: Not Estab. % 0 1 0  Eos Latest Ref Range: Not Estab. % 5 5 7   EOS (ABSOLUTE) Latest Ref Range: 0.0 - 0.4 x10E3/uL 0.3 0.3 0.4  Hemoglobin A1C Latest Ref Range: 4.8 - 5.6 % 5.3 5.4 5.4  TSH Latest Ref Range: 0.450 - 4.500 uIU/mL 0.932 1.600 1.330  Thyroxine (T4) Latest Ref Range: 4.5 - 12.0 ug/dL 5.2 6.7 5.7  Free Thyroxine Index Latest Ref Range: 1.2 - 4.9  1.4 1.7 1.5  T3 Uptake Ratio Latest Ref Range: 24 - 39 % 27 25 27     He reported he is taking etodolac 500mg  po BID  for best relief every day. He wants to continue medication as getting good relief with BID dosing and new Rx for 90 day supply sent to his pharmacy of choice etodolac 500mg  po BID #180 RF3. His PCM had requested MRI but it was denied for his sciatica.  He is considering other options e.g. PT/chiropractor.  He has had weight gain over the past year and also taking college classes in addition to working seated.  That all may be worsening/agitating his sciatica symptoms and cause of worsening elevated LFTs.  Discussed with patient medication and weight gain could be exacerbating his LFTs.  He asked if he should start a liver dietary supplement (himalayan) and I told him I don't recommend weight loss/dietary supplements OTC that are not third party tested as this one is not.  Discussed [employer] has complementary medicine website and can look up possible side effects of herbal medicines on their site.  He stated he is not drinking energy/herbal drinks on a daily basis but does occasionally have 5 hour energy partial bottle never full as he is sensitive to caffeine.  Discussed with patient I recommended weight loss/exercise/hourly stretches/movement if prolonged sitting and repeat LFTs in 6-12 months.  Patient verbalized understanding information/instructions, agreed with plan of care and had no further questions at this time.  Call transferred to RN Hildred Alamin to assist patient with his mychart account.

## 2019-09-12 NOTE — Telephone Encounter (Signed)
Noted please try to reach patient again.

## 2019-09-16 NOTE — Telephone Encounter (Signed)
noted 

## 2019-09-16 NOTE — Telephone Encounter (Signed)
See 4/1 tcon with NP Otila Kluver regarding pt's result review. Encounter closed.

## 2019-09-16 NOTE — Telephone Encounter (Signed)
See 4/1 tcon from NP [facility]. Encounter closed.

## 2019-09-24 ENCOUNTER — Ambulatory Visit: Payer: Self-pay | Admitting: Registered Nurse

## 2019-09-24 ENCOUNTER — Encounter: Payer: Self-pay | Admitting: Registered Nurse

## 2019-09-24 ENCOUNTER — Other Ambulatory Visit: Payer: Self-pay

## 2019-09-24 VITALS — BP 128/83 | HR 68 | Temp 98.5°F

## 2019-09-24 DIAGNOSIS — L03114 Cellulitis of left upper limb: Secondary | ICD-10-CM

## 2019-09-24 MED ORDER — AQUAPHOR EX OINT: TOPICAL_OINTMENT | CUTANEOUS | 0 refills | Status: AC | PRN

## 2019-09-24 MED ORDER — SULFAMETHOXAZOLE-TRIMETHOPRIM 800-160 MG PO TABS: 1.0000 | ORAL_TABLET | Freq: Two times a day (BID) | ORAL | 0 refills | Status: AC

## 2019-09-24 NOTE — Patient Instructions (Signed)
Skin Abscess  A skin abscess is an infected area on or under your skin that contains a collection of pus and other material. An abscess may also be called a furuncle, carbuncle, or boil. An abscess can occur in or on almost any part of your body. Some abscesses break open (rupture) on their own. Most continue to get worse unless they are treated. The infection can spread deeper into the body and eventually into your blood, which can make you feel ill. Treatment usually involves draining the abscess. What are the causes? An abscess occurs when germs, like bacteria, pass through your skin and cause an infection. This may be caused by:  A scrape or cut on your skin.  A puncture wound through your skin, including a needle injection or insect bite.  Blocked oil or sweat glands.  Blocked and infected hair follicles.  A cyst that forms beneath your skin (sebaceous cyst) and becomes infected. What increases the risk? This condition is more likely to develop in people who:  Have a weak body defense system (immune system).  Have diabetes.  Have dry and irritated skin.  Get frequent injections or use illegal IV drugs.  Have a foreign body in a wound, such as a splinter.  Have problems with their lymph system or veins. What are the signs or symptoms? Symptoms of this condition include:  A painful, firm bump under the skin.  A bump with pus at the top. This may break through the skin and drain. Other symptoms include:  Redness surrounding the abscess site.  Warmth.  Swelling of the lymph nodes (glands) near the abscess.  Tenderness.  A sore on the skin. How is this diagnosed? This condition may be diagnosed based on:  A physical exam.  Your medical history.  A sample of pus. This may be used to find out what is causing the infection.  Blood tests.  Imaging tests, such as an ultrasound, CT scan, or MRI. How is this treated? A small abscess that drains on its own may not  need treatment. Treatment for larger abscesses may include:  Moist heat or heat pack applied to the area several times a day.  A procedure to drain the abscess (incision and drainage).  Antibiotic medicines. For a severe abscess, you may first get antibiotics through an IV and then change to antibiotics by mouth. Follow these instructions at home: Medicines   Take over-the-counter and prescription medicines only as told by your health care provider.  If you were prescribed an antibiotic medicine, take it as told by your health care provider. Do not stop taking the antibiotic even if you start to feel better. Abscess care   If you have an abscess that has not drained, apply heat to the affected area. Use the heat source that your health care provider recommends, such as a moist heat pack or a heating pad. ? Place a towel between your skin and the heat source. ? Leave the heat on for 20-30 minutes. ? Remove the heat if your skin turns bright red. This is especially important if you are unable to feel pain, heat, or cold. You may have a greater risk of getting burned.  Follow instructions from your health care provider about how to take care of your abscess. Make sure you: ? Cover the abscess with a bandage (dressing). ? Change your dressing or gauze as told by your health care provider. ? Wash your hands with soap and water before you change the   dressing or gauze. If soap and water are not available, use hand sanitizer.  Check your abscess every day for signs of a worsening infection. Check for: ? More redness, swelling, or pain. ? More fluid or blood. ? Warmth. ? More pus or a bad smell. General instructions  To avoid spreading the infection: ? Do not share personal care items, towels, or hot tubs with others. ? Avoid making skin contact with other people.  Keep all follow-up visits as told by your health care provider. This is important. Contact a health care provider if you  have:  More redness, swelling, or pain around your abscess.  More fluid or blood coming from your abscess.  Warm skin around your abscess.  More pus or a bad smell coming from your abscess.  A fever.  Muscle aches.  Chills or a general ill feeling. Get help right away if you:  Have severe pain.  See red streaks on your skin spreading away from the abscess. Summary  A skin abscess is an infected area on or under your skin that contains a collection of pus and other material.  A small abscess that drains on its own may not need treatment.  Treatment for larger abscesses may include having a procedure to drain the abscess and taking an antibiotic. This information is not intended to replace advice given to you by your health care provider. Make sure you discuss any questions you have with your health care provider. Document Revised: 09/20/2018 Document Reviewed: 07/13/2017 Elsevier Patient Education  [place]. Elbow Bursitis  Bursitis is swelling and pain at the tip of the elbow. This happens when fluid builds up in a sac under the skin (bursa). This may also be called olecranon bursitis. What are the causes? Elbow bursitis may be caused by:  Elbow injury, such as falling onto the elbow.  Leaning on hard surfaces for long periods of time.  Infection from an injury that breaks the skin near the elbow.  A bone growth (spur) that forms at the tip of the elbow.  A medical condition that causes inflammation, such as gout or rheumatoid arthritis. Sometimes the cause is not known. What are the signs or symptoms? The first sign of elbow bursitis is usually swelling at the tip of the elbow. This can grow to be about the size of a golf ball. Swelling may start suddenly or develop gradually. Other symptoms may include:  Pain when bending or leaning on the elbow.  Not being able to move the elbow normally. If bursitis is caused by an infection, you may have:  Redness,  warmth, and tenderness of the elbow.  Drainage of pus from the swollen area over the elbow, if the skin breaks open. How is this diagnosed? This condition may be diagnosed based on:  Your symptoms and medical history.  Any recent injuries you have had.  A physical exam.  X-rays to check for a bone spur or fracture.  Draining fluid from the bursa to test it for infection.  Blood tests to rule out gout or rheumatoid arthritis. How is this treated? Treatment for elbow bursitis depends on the cause. Treatment may include:  Medicines. These may include: ? Over-the-counter medicines to relieve pain and inflammation. ? Antibiotic medicines. ? Injections of anti-inflammatory medicines (steroids).  Draining fluid from the bursa.  Wrapping your elbow with a bandage.  Wearing elbow pads. If these treatments do not help, you may need surgery to remove the bursa. Follow these instructions  at home: Medicines  Take over-the-counter and prescription medicines only as told by your health care provider.  If you were prescribed an antibiotic medicine, take it as told by your health care provider. Do not stop taking the antibiotic even if you start to feel better. Managing pain, stiffness, and swelling   If directed, put ice on your elbow: ? Put ice in a plastic bag. ? Place a towel between your skin and the bag. ? Leave the ice on for 20 minutes, 2-3 times a day.  If your bursitis is caused by an injury, rest your elbow and wear your bandage as told by your health care provider.  Use elbow pads or elbow wraps to cushion your elbow as needed. General instructions  Avoid any activities that cause elbow pain. Ask your health care provider what activities are safe for you.  Keep all follow-up visits as told by your health care provider. This is important. Contact a health care provider if you have:  A fever.  Symptoms that do not get better with treatment.  Pain or swelling  that: ? Gets worse. ? Goes away and then comes back.  Pus draining from your elbow. Get help right away if you have:  Trouble moving your arm, hand, or fingers. Summary  Elbow bursitis is inflammation of the fluid-filled sac (bursa) between the tip of your elbow bone (olecranon) and your skin.  Treatment for elbow bursitis depends on the cause. It may include medicines to relieve pain and inflammation, antibiotic medicines, and draining fluid from your elbow.  Contact a health care provider if your symptoms do not get better with treatment, or if your symptoms go away and then come back. This information is not intended to replace advice given to you by your health care provider. Make sure you discuss any questions you have with your health care provider. Document Revised: 05/12/2017 Document Reviewed: 05/09/2017 Elsevier Patient Education  [place]. Cellulitis, Adult  Cellulitis is a skin infection. The infected area is usually warm, red, swollen, and tender. This condition occurs most often in the arms and lower legs. The infection can travel to the muscles, blood, and underlying tissue and become serious. It is very important to get treated for this condition. What are the causes? Cellulitis is caused by bacteria. The bacteria enter through a break in the skin, such as a cut, burn, insect bite, open sore, or crack. What increases the risk? This condition is more likely to occur in people who:  Have a weak body defense system (immune system).  Have open wounds on the skin, such as cuts, burns, bites, and scrapes. Bacteria can enter the body through these open wounds.  Are older than 34 years of age.  Have diabetes.  Have a type of long-lasting (chronic) liver disease (cirrhosis) or kidney disease.  Are obese.  Have a skin condition such as: ? Itchy rash (eczema). ? Slow movement of blood in the veins (venous stasis). ? Fluid buildup below the skin (edema).  Have  had radiation therapy.  Use IV drugs. What are the signs or symptoms? Symptoms of this condition include:  Redness, streaking, or spotting on the skin.  Swollen area of the skin.  Tenderness or pain when an area of the skin is touched.  Warm skin.  A fever.  Chills.  Blisters. How is this diagnosed? This condition is diagnosed based on a medical history and physical exam. You may also have tests, including:  Blood tests.  Imaging tests. How is this treated? Treatment for this condition may include:  Medicines, such as antibiotic medicines or medicines to treat allergies (antihistamines).  Supportive care, such as rest and application of cold or warm cloths (compresses) to the skin.  Hospital care, if the condition is severe. The infection usually starts to get better within 1-2 days of treatment. Follow these instructions at home:  Medicines  Take over-the-counter and prescription medicines only as told by your health care provider.  If you were prescribed an antibiotic medicine, take it as told by your health care provider. Do not stop taking the antibiotic even if you start to feel better. General instructions  Drink enough fluid to keep your urine pale yellow.  Do not touch or rub the infected area.  Raise (elevate) the infected area above the level of your heart while you are sitting or lying down.  Apply warm or cold compresses to the affected area as told by your health care provider.  Keep all follow-up visits as told by your health care provider. This is important. These visits let your health care provider make sure a more serious infection is not developing. Contact a health care provider if:  You have a fever.  Your symptoms do not begin to improve within 1-2 days of starting treatment.  Your bone or joint underneath the infected area becomes painful after the skin has healed.  Your infection returns in the same area or another area.  You  notice a swollen bump in the infected area.  You develop new symptoms.  You have a general ill feeling (malaise) with muscle aches and pains. Get help right away if:  Your symptoms get worse.  You feel very sleepy.  You develop vomiting or diarrhea that persists.  You notice red streaks coming from the infected area.  Your red area gets larger or turns dark in color. These symptoms may represent a serious problem that is an emergency. Do not wait to see if the symptoms will go away. Get medical help right away. Call your local emergency services (911 in the [place] Do not drive yourself to the hospital. Summary  Cellulitis is a skin infection. This condition occurs most often in the arms and lower legs.  Treatment for this condition may include medicines, such as antibiotic medicines or antihistamines.  Take over-the-counter and prescription medicines only as told by your health care provider. If you were prescribed an antibiotic medicine, do not stop taking the antibiotic even if you start to feel better.  Contact a health care provider if your symptoms do not begin to improve within 1-2 days of starting treatment or your symptoms get worse.  Keep all follow-up visits as told by your health care provider. This is important. These visits let your health care provider make sure that a more serious infection is not developing. This information is not intended to replace advice given to you by your health care provider. Make sure you discuss any questions you have with your health care provider. Document Revised: 10/19/2017 Document Reviewed: 10/19/2017 Elsevier Patient Education  [place].

## 2019-09-24 NOTE — Progress Notes (Signed)
Subjective:    Patient ID: Bruce Kemp, male    DOB: 24-Jun-1985, 34 y.o.   MRN: [id number]  34y/o caucasian male established patient here for evaluation left elbow pain/swelling/redness.  Noted white head small on Saturday not much pus came out when he squeezed it.  Worst pain was this weekend.  Did fill gas in boat and had prolonged elbow contact/pressure to put gas in from gas cans but denied trauma, fall, new exercise routine. Taking his etodolac every day as prescribed.  Denied previous injury to elbow or recurrent flare ups/rashes.     Review of Systems  Constitutional: Negative for activity change, appetite change, chills, diaphoresis, fatigue and fever.  HENT: Negative for trouble swallowing and voice change.   Eyes: Negative for photophobia and visual disturbance.  Respiratory: Negative for cough, shortness of breath, wheezing and stridor.   Cardiovascular: Negative for chest pain and palpitations.  Gastrointestinal: Negative for abdominal pain, diarrhea, nausea and vomiting.  Endocrine: Negative for cold intolerance and heat intolerance.  Genitourinary: Negative for difficulty urinating.  Musculoskeletal: Positive for arthralgias and joint swelling. Negative for back pain, gait problem, neck pain and neck stiffness.  Skin: Positive for color change and rash. Negative for pallor and wound.  Allergic/Immunologic: Negative for food allergies.  Neurological: Negative for tremors, syncope, weakness and numbness.  Hematological: Negative for adenopathy. Does not bruise/bleed easily.  Psychiatric/Behavioral: Negative for agitation, confusion and sleep disturbance.       Objective:   Physical Exam Vitals and nursing note reviewed.  Constitutional:      General: He is awake. He is not in acute distress.    Appearance: Normal appearance. He is well-developed, well-groomed and overweight. He is not ill-appearing, toxic-appearing or diaphoretic.  HENT:     Head: Normocephalic and  atraumatic.     Jaw: There is normal jaw occlusion.     Salivary Glands: Right salivary gland is not diffusely enlarged or tender. Left salivary gland is not diffusely enlarged or tender.     Right Ear: Hearing and external ear normal.     Left Ear: Hearing and external ear normal.     Nose: Nose normal.     Mouth/Throat:     Pharynx: Oropharynx is clear.  Eyes:     General: Lids are normal. Vision grossly intact. Gaze aligned appropriately. No allergic shiner, visual field deficit or scleral icterus.       Right eye: No discharge.        Left eye: No discharge.     Extraocular Movements: Extraocular movements intact.     Conjunctiva/sclera: Conjunctivae normal.     Pupils: Pupils are equal, round, and reactive to light.  Neck:     Trachea: Trachea and phonation normal.  Cardiovascular:     Rate and Rhythm: Normal rate and regular rhythm.     Pulses: Normal pulses.          Radial pulses are 2+ on the right side and 2+ on the left side.     Heart sounds: Normal heart sounds.  Pulmonary:     Effort: Pulmonary effort is normal. No respiratory distress.     Breath sounds: Normal breath sounds. No stridor, decreased air movement or transmitted upper airway sounds. No wheezing, rhonchi or rales.     Comments: Spoke full sentences without difficulty; no cough observed in exam room; wearing cloth mask due to covid 19 pandemic Abdominal:     Palpations: Abdomen is soft.  Musculoskeletal:  General: Swelling and tenderness present. No deformity or signs of injury. Normal range of motion.     Right shoulder: Normal.     Left shoulder: Normal.     Right upper arm: Normal.     Left upper arm: Normal.     Right elbow: Normal. No swelling, deformity, effusion or lacerations. Normal range of motion. No tenderness.     Left elbow: Swelling and effusion present. No deformity or lacerations. Normal range of motion. Tenderness present. No radial head, medial epicondyle, lateral epicondyle or  olecranon process tenderness.     Right forearm: Normal.     Left forearm: Normal.     Right hand: Normal.     Left hand: Normal.     Cervical back: Normal, normal range of motion and neck supple.     Thoracic back: Normal.     Lumbar back: Normal.     Right lower leg: Normal. No edema.     Left lower leg: Normal. No edema.  Lymphadenopathy:     Head:     Right side of head: No submental, submandibular or preauricular adenopathy.     Left side of head: No submental, submandibular or preauricular adenopathy.     Cervical:     Right cervical: No superficial cervical adenopathy.    Left cervical: No superficial cervical adenopathy.  Skin:    General: Skin is warm and dry.     Capillary Refill: Capillary refill takes less than 2 seconds.     Coloration: Skin is not jaundiced or pale.     Findings: Abrasion, erythema and rash present. No abscess, acne, bruising, burn, ecchymosis, laceration, lesion, petechiae or wound. Rash is macular and papular. Rash is not crusting, nodular, purpuric, pustular, scaling, urticarial or vesicular.     Nails: There is no clubbing.          Comments: Slightly TTP over macular erythema; full AROM bilateral elbows equal; strength equal bilateral hands/upper extremities  Neurological:     General: No focal deficit present.     Mental Status: He is alert and oriented to person, place, and time. Mental status is at baseline.     Cranial Nerves: Cranial nerves are intact. No cranial nerve deficit, dysarthria or facial asymmetry.     Sensory: Sensation is intact. No sensory deficit.     Motor: Motor function is intact. No weakness.     Coordination: Coordination is intact. Coordination normal.     Gait: Gait is intact. Gait normal.     Comments: Gait sure and steady in hallway; on/off exam table and in/out of chair without difficulty; bilateral hand grasp equal 5/5  Psychiatric:        Attention and Perception: Attention and perception normal.        Mood and  Affect: Mood and affect normal.        Speech: Speech normal.        Behavior: Behavior normal. Behavior is cooperative.        Thought Content: Thought content normal.        Cognition and Memory: Cognition and memory normal.        Judgment: Judgment normal.           Assessment & Plan:  A-cellulitis left elbow initial encounter  P-Exitcare handouts on skin infection/abscess, bursitis printed and given to patient. RTC if worsening erythema spreading towards wrist or shoulder, pain, purulent discharge, fever especially after 48 hours of bactrim DS po BID x 7 days #  40 RF0 dispensed from [facility] to patient.  Given aquaphor 3 UD given to patient from clinic stock ointment apply BID to dry skin left elbow. Wash towels, washcloths, sheets in hot water with bleach every couple of days until infection resolved. Wash area with soap and water at least daily.  Avoid scratching affected area and prolonged leaning on elbows with body weight.  May use cryotherapy 15 minutes QID and elevate prn if swelling or moist heat pack prn to help bring more circulation to area to help resolve infection.   Patient verbalized understanding, agreed with plan of care and had no further questions at this time.

## 2019-10-10 ENCOUNTER — Emergency Department (HOSPITAL_COMMUNITY): Payer: No Typology Code available for payment source

## 2019-10-10 ENCOUNTER — Emergency Department (HOSPITAL_COMMUNITY)
Admission: EM | Admit: 2019-10-10 | Discharge: 2019-10-10 | Disposition: A | Discharge status: Home / Self Care | Payer: No Typology Code available for payment source | Attending: Emergency Medicine | Admitting: Emergency Medicine

## 2019-10-10 ENCOUNTER — Telehealth: Payer: Self-pay | Admitting: *Deleted

## 2019-10-10 ENCOUNTER — Encounter (HOSPITAL_COMMUNITY): Payer: Self-pay | Admitting: Emergency Medicine

## 2019-10-10 ENCOUNTER — Telehealth: Payer: No Typology Code available for payment source | Admitting: Family Medicine

## 2019-10-10 ENCOUNTER — Other Ambulatory Visit: Payer: Self-pay

## 2019-10-10 DIAGNOSIS — Z20822 Contact with and (suspected) exposure to covid-19: Secondary | ICD-10-CM | POA: Insufficient documentation

## 2019-10-10 DIAGNOSIS — Z79899 Other long term (current) drug therapy: Secondary | ICD-10-CM | POA: Insufficient documentation

## 2019-10-10 DIAGNOSIS — A938 Other specified arthropod-borne viral fevers: Secondary | ICD-10-CM

## 2019-10-10 DIAGNOSIS — R110 Nausea: Secondary | ICD-10-CM

## 2019-10-10 DIAGNOSIS — R0602 Shortness of breath: Secondary | ICD-10-CM

## 2019-10-10 DIAGNOSIS — F17210 Nicotine dependence, cigarettes, uncomplicated: Secondary | ICD-10-CM | POA: Diagnosis not present

## 2019-10-10 DIAGNOSIS — J45909 Unspecified asthma, uncomplicated: Secondary | ICD-10-CM | POA: Diagnosis not present

## 2019-10-10 DIAGNOSIS — R7989 Other specified abnormal findings of blood chemistry: Secondary | ICD-10-CM

## 2019-10-10 LAB — BASIC METABOLIC PANEL
Anion gap: 12 (ref 5–15)
BUN: 9 mg/dL (ref 6–20)
CO2: 18 mmol/L — ABNORMAL LOW (ref 22–32)
Calcium: 9 mg/dL (ref 8.9–10.3)
Chloride: 100 mmol/L (ref 98–111)
Creatinine, Ser: 1.19 mg/dL (ref 0.61–1.24)
GFR calc Af Amer: >60 mL/min (ref >60)
GFR calc non Af Amer: >60 mL/min (ref >60)
Glucose, Bld: 110 mg/dL — ABNORMAL HIGH (ref 70–99)
Potassium: 4.1 mmol/L (ref 3.5–5.1)
Sodium: 130 mmol/L — ABNORMAL LOW (ref 135–145)

## 2019-10-10 LAB — RESPIRATORY PANEL BY RT PCR (FLU A&B, COVID)
Influenza A by PCR: NEGATIVE
Influenza B by PCR: NEGATIVE
SARS Coronavirus 2 by RT PCR: NEGATIVE

## 2019-10-10 LAB — CBC
HCT: 48.2 % (ref 39.0–52.0)
Hemoglobin: 16.7 g/dL (ref 13.0–17.0)
MCH: 30.7 pg (ref 26.0–34.0)
MCHC: 34.6 g/dL (ref 30.0–36.0)
MCV: 88.6 fL (ref 80.0–100.0)
Platelets: 162 10*3/uL (ref 150–400)
RBC: 5.44 MIL/uL (ref 4.22–5.81)
RDW: 12.5 % (ref 11.5–15.5)
WBC: 2.4 10*3/uL — ABNORMAL LOW (ref 4.0–10.5)
nRBC: 0 % (ref 0.0–0.2)

## 2019-10-10 LAB — HEPATIC FUNCTION PANEL
ALT: 114 U/L — ABNORMAL HIGH (ref 0–44)
AST: 74 U/L — ABNORMAL HIGH (ref 15–41)
Albumin: 3.8 g/dL (ref 3.5–5.0)
Alkaline Phosphatase: 53 U/L (ref 38–126)
Bilirubin, Direct: 0.2 mg/dL (ref 0.0–0.2)
Indirect Bilirubin: 0.7 mg/dL (ref 0.3–0.9)
Total Bilirubin: 0.9 mg/dL (ref 0.3–1.2)
Total Protein: 7.1 g/dL (ref 6.5–8.1)

## 2019-10-10 LAB — URINALYSIS, ROUTINE W REFLEX MICROSCOPIC
Bilirubin Urine: NEGATIVE
Glucose, UA: NEGATIVE mg/dL
Hgb urine dipstick: NEGATIVE
Ketones, ur: NEGATIVE mg/dL
Leukocytes,Ua: NEGATIVE
Nitrite: NEGATIVE
Protein, ur: NEGATIVE mg/dL
Specific Gravity, Urine: 1.012 (ref 1.005–1.030)
pH: 6 (ref 5.0–8.0)

## 2019-10-10 LAB — POC SARS CORONAVIRUS 2 AG -  ED: SARS Coronavirus 2 Ag: NEGATIVE

## 2019-10-10 MED ORDER — DIPHENHYDRAMINE HCL 50 MG/ML IJ SOLN
25.0000 mg | Freq: Once | INTRAMUSCULAR | Status: AC
  Administered 2019-10-10: 25 mg via INTRAVENOUS
  Filled 2019-10-10: qty 1

## 2019-10-10 MED ORDER — PROCHLORPERAZINE EDISYLATE 10 MG/2ML IJ SOLN
10.0000 mg | Freq: Once | INTRAMUSCULAR | Status: AC
  Administered 2019-10-10: 10 mg via INTRAVENOUS
  Filled 2019-10-10: qty 2

## 2019-10-10 MED ORDER — SODIUM CHLORIDE 0.9 % IV BOLUS
1000.0000 mL | Freq: Once | INTRAVENOUS | Status: AC
  Administered 2019-10-10: 1000 mL via INTRAVENOUS

## 2019-10-10 MED ORDER — ACETAMINOPHEN 325 MG PO TABS: 650.0000 mg | ORAL_TABLET | Freq: Once | ORAL | Status: DC | PRN

## 2019-10-10 MED ORDER — DOXYCYCLINE HYCLATE 100 MG PO CAPS: 100.0000 mg | ORAL_CAPSULE | Freq: Two times a day (BID) | ORAL | 0 refills | Status: AC

## 2019-10-10 NOTE — ED Triage Notes (Signed)
Pt reports nausea and chills starting Monday night. States every night between 1-3, he has extreme nausea, body aches, and SOB. Pt reports feeling like he might pass out.

## 2019-10-10 NOTE — Discharge Instructions (Signed)
We discussed starting you on doxycycline, take 1 tablet twice a day for the next 7 days.  You may also take Tylenol to help with your fever, please take this every 4-6 hours.  Do not exceed the maximum amount of 3,000 mg.  Follow-up with your primary care physician as needed.  Your Covid results should return within 24 hours.

## 2019-10-10 NOTE — ED Provider Notes (Signed)
[facility] EMERGENCY DEPARTMENT Provider Note   CSN: [id number] Arrival date & time: 10/10/19  [id number]     History Chief Complaint  Patient presents with  . Nausea  . Shortness of Breath    Bruce Kemp is a 34 y.o. male.  34 y.o male with a PMH of Anxiety, Asthma, GERD presents to the ED with a chief complaint of fever, shortness of breath x 3 days. According to patient symptoms began around 3 am Tuesday, with chills, body aches and "body shivering", states he tried taking some "tylenol for body aches" without improvement in his symptoms. Tmax of 102 last night, arrived in the ED febrile. He also endorses shortness of breath, reports this is difficult for him while sitting down, moving.  He reports having a telehealth visit 2 days ago, was told he needed to have an outpatient Covid test versus take panel.  According to patient he was bit by a take 1 to 1-1/2 months ago.  He also endorses a dry cough, does have a current history of vaping but no tobacco use.  He also notes, some changes to his urine, stating that there is difficulty voiding.  Denies any chest pain, abdominal pain, rashes, or abdominal pain.  The history is provided by the patient and medical records.       Past Medical History:  Diagnosis Date  . Anxiety   . Asthma   . GERD (gastroesophageal reflux disease)     Patient Active Problem List   Diagnosis Date Noted  . 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 SINUSITIS - ACUTE-NOS 12/23/2008  . CERUMEN IMPACTION, LEFT 11/28/2008  . CONTACT DERMATITIS 11/28/2008    History reviewed. No pertinent surgical history.     Family History  Problem Relation Age of Onset  . Diabetes Father   . Hypertension Maternal Grandmother   . Diabetes Maternal Grandfather   . Hypertension Maternal Grandfather   . Hypertension Paternal Grandmother   . Hypertension Paternal Grandfather   . Kidney cancer Neg Hx   . Kidney disease Neg Hx   .  Prostate cancer Neg Hx     Social History   Tobacco Use  . Smoking status: Current Some Day Smoker    Types: Cigarettes  . Smokeless tobacco: Never Used  Substance Use Topics  . Alcohol use: Yes    Comment: socially  . Drug use: No    Home Medications Prior to Admission medications   Medication Sig Start Date End Date Taking? Authorizing Provider  acetaminophen (TYLENOL) 650 MG CR tablet Take 650 mg by mouth every 8 (eight) hours as needed for pain.   Yes [provider]  ALPRAZolam Duanne Moron) 0.5 MG tablet Take 1 tablet (0.5 mg total) by mouth at bedtime as needed for anxiety. 05/31/19  Yes Susy Frizzle, MD  etodolac (LODINE) 500 MG tablet Take 1 tablet (500 mg total) by mouth 2 (two) times daily. 09/12/19 12/11/19 Yes Betancourt, Aura Fey, NP  ondansetron (ZOFRAN-ODT) 4 MG disintegrating tablet Take 4 mg by mouth every 6 (six) hours as needed. 10/09/19  Yes [provider]  doxycycline (VIBRAMYCIN) 100 MG capsule Take 1 capsule (100 mg total) by mouth 2 (two) times daily for 7 days. 10/10/19 10/17/19  Janeece Fitting, PA-C    Allergies    Patient has no known allergies.  Review of Systems   Review of Systems  Constitutional: Positive for chills and fever.  HENT: Negative for sore throat.   Respiratory:  Positive for cough and shortness of breath.   Cardiovascular: Negative for chest pain.  Gastrointestinal: Positive for nausea. Negative for abdominal pain and vomiting.  Genitourinary: Negative for flank pain.  Musculoskeletal: Positive for myalgias. Negative for back pain.  Neurological: Positive for weakness. Negative for light-headedness and headaches.  All other systems reviewed and are negative.   Physical Exam Updated Vital Signs BP 130/76   Pulse 85   Temp 99.6 F (37.6 C) (Oral)   Resp 16   Ht 6\' 1"  (1.854 m)   SpO2 96%   BMI 34.30 kg/m   Physical Exam Vitals and nursing note reviewed.  Constitutional:      Appearance: He is well-developed.    HENT:     Head: Normocephalic and atraumatic.     Mouth/Throat:     Mouth: Mucous membranes are moist.     Pharynx: No pharyngeal swelling or oropharyngeal exudate.  Neck:     Comments: No meningismus.  Cardiovascular:     Rate and Rhythm: Normal rate.  Pulmonary:     Effort: Pulmonary effort is normal. No tachypnea.     Breath sounds: No decreased breath sounds or wheezing.     Comments: Lungs are clear to auscultation.  Chest:     Chest wall: No tenderness.  Abdominal:     Palpations: Abdomen is soft.  Musculoskeletal:     Cervical back: Normal range of motion and neck supple.  Skin:    General: Skin is warm and dry.  Neurological:     Mental Status: He is alert and oriented to person, place, and time.     ED Results / Procedures / Treatments   Labs (all labs ordered are listed, but only abnormal results are displayed) Labs Reviewed  CBC - Abnormal; Notable for the following components:      Result Value   WBC 2.4 (*)    All other components within normal limits  BASIC METABOLIC PANEL - Abnormal; Notable for the following components:   Sodium 130 (*)    CO2 18 (*)    Glucose, Bld 110 (*)    All other components within normal limits  HEPATIC FUNCTION PANEL - Abnormal; Notable for the following components:   AST 74 (*)    ALT 114 (*)    All other components within normal limits  RESPIRATORY PANEL BY RT PCR (FLU A&B, COVID)  URINALYSIS, ROUTINE W REFLEX MICROSCOPIC  POC SARS CORONAVIRUS 2 AG -  ED    EKG EKG Interpretation  Date/Time:  Thursday October 10 2019 09:20:41 EDT Ventricular Rate:  116 PR Interval:  146 QRS Duration: 98 QT Interval:  304 QTC Calculation: 422 R Axis:   114 Text Interpretation: Sinus tachycardia Left posterior fascicular block Abnormal ECG Baseline wander Nonspecific T wave abnormality, improved in Otherwise no significant change Confirmed by Deno Etienne [phone]) on 10/10/2019 12:02:04 PM   Radiology DG Chest Portable 1 View  Result  Date: 10/10/2019 CLINICAL DATA:  Shortness of breath with nausea and chills 4 days. EXAM: PORTABLE CHEST 1 VIEW COMPARISON:  None. FINDINGS: Lungs are adequately inflated and otherwise clear. Cardiomediastinal silhouette, bones and soft tissues are normal. IMPRESSION: No active disease. Electronically Signed   By: Marin Olp M.D.   On: 10/10/2019 13:13    Procedures Procedures (including critical care time)  Medications Ordered in ED Medications  acetaminophen (TYLENOL) tablet 650 mg (has no administration in time range)  sodium chloride 0.9 % bolus 1,000 mL (0 mLs Intravenous Stopped 10/10/19  1601)  diphenhydrAMINE (BENADRYL) injection 25 mg (25 mg Intravenous Given 10/10/19 1513)  prochlorperazine (COMPAZINE) injection 10 mg (10 mg Intravenous Given 10/10/19 1513)    ED Course  I have reviewed the triage vital signs and the nursing notes.  Pertinent labs & imaging results that were available during my care of the patient were reviewed by me and considered in my medical decision making (see chart for details).  Clinical Course as of Oct 10 1622  Thu Oct 10, 2019  1252 WBC(!): 2.4 [JS]  1357 SpO2: 92 % [JS]    Clinical Course User Index [JS] Janeece Fitting, PA-C   MDM Rules/Calculators/A&P  Patient with past medical history of asthma presents to the ED with complaints of body aches, chills, fever for the past 3 days.  According to patient symptoms began around 3 AM on Tuesday morning, states he felt overall unwell, weak.  Reports doing a telehealth visit and was advised to seek Covid testing, which she did not do in an outpatient basis.  Right in the ED febrile, does report shortness of breath, this is modified with movement and exertion.  He also reports he was bit by a tick a month to a month and a half ago, there is no rashes, headaches, per patient.  Interpretation of his labs by me, Cbc significant for leukopenia, hemoglobin is within normal limits. BMP is remarkable for mild  hyponatremia, CO2 of 18, unknown etiology for his acidosis. Creatine is within normal limits. LFTs are elevated, no prior history of alcohol abuse. POC covid 19 test is negative. Xray did not show any acute process such as consolidation or pneumothorax.   UA without any signs of nitrates, leukocytes, or signs of infection.  Some suspicion for Covid versus tickborne illness.  I discussed results of labs with patient at length.   2:37 PM patient was reevaluated by me, does report some relief on his headache from IV fluids.  We will also provide him with headache cocktail at this time.  We discussed lab result, he does report the tick bite happened along his left thigh about a month, it was not retained there for a long period of time.  There is no visible rash to the area.  We discussed treatment with doxycycline for tick versus pneumonia although chest x-ray today is clear.  Will have patient ambulate in order to evaluate if there is any oxygen desaturation prior to disposition home.  We also discussed, obtaining a second Covid test that is not a point of care in order to have a more accurate result.  4:21 PM patient was ambulated by nursing staff, maintain his oxygen saturation 99%, temperature was rechecked was 99.6.  We discussed Doxy therapy for the next week.  Return precautions were discussed at length.  Patient stable for discharge.    Portions of this note were generated with [employer]. Dictation errors may occur despite best attempts at proofreading.  Final Clinical Impression(s) / ED Diagnoses Final diagnoses:  Nausea  Shortness of breath    Rx / DC Orders ED Discharge Orders         Ordered    doxycycline (VIBRAMYCIN) 100 MG capsule  2 times daily     10/10/19 1608           Janeece Fitting, PA-C 10/10/19 [place], [place], DO 10/11/19 [phone]

## 2019-10-10 NOTE — ED Notes (Signed)
States he was biten by a tick 1 month ago

## 2019-10-10 NOTE — Telephone Encounter (Signed)
SO contacted RN this morning prior to clinic opening. Left a VM reporting pt was "bad off" since last night and seeking advice for him. Spoke with her by phone upon arrival to clinic. She reported they had checked in to the ED in the mean time. Pt has been waking up with fever, chills, night sweats body aches each night between 1-3a since Monday night (3 nights at this point). He had endorsed ShOB to her as well.  ED running Covid test (rapid has since returned as negative). SO also reports pt had a tick bite approx one month ago. She is not aware of any rashes, previous sx, etc r/t this. Pt also was treated with 7 day round of abx started 4/13 for cellulitis/suspected abscess to L elbow. SO unsure if that had fully resolved or not.  Advised SO ED will r/o life threatening conditions but may still need pcp f/u. Was already scheduled prior to ED visit to see pcp this afternoon for these sx. Advised to keep appt in case he is discharged in time and has additional needs. She verbalizes understanding and agreement. No further questions/concerns. RN will follow ED course through [facility] and plan to f/u with pt or SO tomorrow when back in clinic.

## 2019-10-15 ENCOUNTER — Encounter: Payer: Self-pay | Admitting: *Deleted

## 2019-10-15 NOTE — Telephone Encounter (Signed)
Patient contacted via telephone and has been taking doxycycline 100mg  po BID without any problems denied n/v/d/sunburn.  Joint aches resolved along with fevers last check 100.5 this weekend.  Sleeping still fragmented trouble getting to sleep.  Knee and hip pain back to baseline but last week etodolac wasn't helping.  Denied rash.  Took home test covid negative; ER test negative and [facility] test covid negative.  Spoke full sentences without difficulty no cough observed A&Ox3.  Patient was supposed to receive second covid vaccination last week needs to rescheduled discussed symptoms should be improving, no fever in previous 48 hours greater than 100.5 and he has had negative covid test.  Manufacturer recommends getting second vaccine as soon as possible but no later than 4 weeks after recommended second dose date.  Patient reported he would call to reschedule plans next week at this time.  Patient to have LFTs and CBC repeated in 1 month due to low WBC and elevated AST/ALT worsening with RN Hildred Alamin at [facility] clinic.  Discussed signs and symptoms of lyme disease/STARI/RMSF with patient and if having symptoms he should notify clinic staff.  Patient verbalized understanding information/instructions, agreed with plan of care and had no further questions at this time.

## 2019-10-25 NOTE — Telephone Encounter (Signed)
Pt scheduled for repeat nonfasting labs 11/14/19 at 1030.

## 2019-10-28 NOTE — Telephone Encounter (Signed)
Noted labs scheduled for 11/14/2019

## 2019-11-14 ENCOUNTER — Ambulatory Visit: Payer: Self-pay | Admitting: *Deleted

## 2019-11-14 ENCOUNTER — Other Ambulatory Visit: Payer: Self-pay

## 2019-11-14 DIAGNOSIS — A938 Other specified arthropod-borne viral fevers: Secondary | ICD-10-CM

## 2019-11-14 DIAGNOSIS — R7989 Other specified abnormal findings of blood chemistry: Secondary | ICD-10-CM

## 2019-11-14 NOTE — Progress Notes (Signed)
nonfasting labs. LFT and CBC per NP Tina's orders.

## 2019-11-15 LAB — HEPATIC FUNCTION PANEL
ALT: 93 IU/L — ABNORMAL HIGH (ref 0–44)
AST: 51 IU/L — ABNORMAL HIGH (ref 0–40)
Albumin: 4.7 g/dL (ref 4.0–5.0)
Alkaline Phosphatase: 57 IU/L (ref 48–121)
Bilirubin Total: 1 mg/dL (ref 0.0–1.2)
Bilirubin, Direct: 0.23 mg/dL (ref 0.00–0.40)
Total Protein: 7.2 g/dL (ref 6.0–8.5)

## 2019-11-15 LAB — CBC WITH DIFFERENTIAL/PLATELET
Basophils Absolute: 0 10*3/uL (ref 0.0–0.2)
Basos: 1 %
EOS (ABSOLUTE): 0.3 10*3/uL (ref 0.0–0.4)
Eos: 6 %
Hematocrit: 47.5 % (ref 37.5–51.0)
Hemoglobin: 16.5 g/dL (ref 13.0–17.7)
Immature Grans (Abs): 0 10*3/uL (ref 0.0–0.1)
Immature Granulocytes: 0 %
Lymphocytes Absolute: 2.1 10*3/uL (ref 0.7–3.1)
Lymphs: 39 %
MCH: 30.9 pg (ref 26.6–33.0)
MCHC: 34.7 g/dL (ref 31.5–35.7)
MCV: 89 fL (ref 79–97)
Monocytes Absolute: 0.4 10*3/uL (ref 0.1–0.9)
Monocytes: 8 %
Neutrophils Absolute: 2.5 10*3/uL (ref 1.4–7.0)
Neutrophils: 46 %
Platelets: 305 10*3/uL (ref 150–450)
RBC: 5.34 x10E6/uL (ref 4.14–5.80)
RDW: 12.2 % (ref 11.6–15.4)
WBC: 5.5 10*3/uL (ref 3.4–10.8)

## 2019-11-15 NOTE — Progress Notes (Signed)
Noted reviewed RN note

## 2019-11-15 NOTE — Progress Notes (Signed)
Left phone mesg for pt on private workline VM advising of MyChart containing lab results and mesg/recommendations from NP [facility]. Advised pt to contact clinic by phone or Mychart with any questions or concerns.

## 2019-11-28 ENCOUNTER — Other Ambulatory Visit: Payer: Self-pay | Admitting: Family Medicine

## 2019-11-28 NOTE — Telephone Encounter (Signed)
Last refilled: 05/31/2019 Last office visit:  05/14/2018

## 2019-12-09 ENCOUNTER — Encounter: Payer: Self-pay | Admitting: Family Medicine

## 2019-12-20 ENCOUNTER — Ambulatory Visit (INDEPENDENT_AMBULATORY_CARE_PROVIDER_SITE_OTHER): Payer: PRIVATE HEALTH INSURANCE | Admitting: Family Medicine

## 2019-12-20 ENCOUNTER — Other Ambulatory Visit: Payer: Self-pay

## 2019-12-20 VITALS — BP 140/80 | HR 88 | Temp 97.4°F | Ht 72.0 in | Wt 272.0 lb

## 2019-12-20 DIAGNOSIS — F410 Panic disorder [episodic paroxysmal anxiety] without agoraphobia: Secondary | ICD-10-CM | POA: Diagnosis not present

## 2019-12-20 DIAGNOSIS — R7989 Other specified abnormal findings of blood chemistry: Secondary | ICD-10-CM | POA: Diagnosis not present

## 2019-12-20 MED ORDER — ALPRAZOLAM 0.5 MG PO TABS: 0.5000 mg | ORAL_TABLET | Freq: Every evening | ORAL | 0 refills | Status: DC | PRN

## 2019-12-20 NOTE — Progress Notes (Signed)
Subjective:    Patient ID: Bruce Kemp, male    DOB: 1985/11/22, 34 y.o.   MRN: [id number]  HPI Patient has a history of panic attacks and generalized anxiety disorder.  30 Xanax will typically last him 6 months.  He uses the medication sparingly only when he feels extremely anxious.  He has not been seen in quite some time.  Recently had lab work from his job.  This did show significant elevations in his liver function test.  AST was typically between 40 and 50.  ALT was between 70 and 100.  I suspect fatty liver disease.  Patient had several questions regarding this. Past Medical History:  Diagnosis Date  . Anxiety   . Asthma   . GERD (gastroesophageal reflux disease)    No past surgical history on file. Current Outpatient Medications on File Prior to Visit  Medication Sig Dispense Refill  . acetaminophen (TYLENOL) 650 MG CR tablet Take 650 mg by mouth every 8 (eight) hours as needed for pain.    Marland Kitchen ondansetron (ZOFRAN-ODT) 4 MG disintegrating tablet Take 4 mg by mouth every 6 (six) hours as needed.     No current facility-administered medications on file prior to visit.   No Known Allergies Social History   Socioeconomic History  . Marital status: Single    Spouse name: Not on file  . Number of children: Not on file  . Years of education: Not on file  . Highest education level: Not on file  Occupational History  . Occupation: Engineer, manufacturing: [employer]  Tobacco Use  . Smoking status: Current Some Day Smoker    Types: Cigarettes  . Smokeless tobacco: Never Used  Vaping Use  . Vaping Use: Never used  Substance and Sexual Activity  . Alcohol use: Yes    Comment: socially  . Drug use: No  . Sexual activity: Yes    Birth control/protection: Pill  Other Topics Concern  . Not on file  Social History Narrative  . Not on file   Social Determinants of Health   Financial Resource Strain:   . Difficulty of Paying Living Expenses:   Food Insecurity:    . Worried About Charity fundraiser in the Last Year:   . Arboriculturist in the Last Year:   Transportation Needs:   . Film/video editor (Medical):   Marland Kitchen Lack of Transportation (Non-Medical):   Physical Activity:   . Days of Exercise per Week:   . Minutes of Exercise per Session:   Stress:   . Feeling of Stress :   Social Connections:   . Frequency of Communication with Friends and Family:   . Frequency of Social Gatherings with Friends and Family:   . Attends Religious Services:   . Active Member of Clubs or Organizations:   . Attends Archivist Meetings:   Marland Kitchen Marital Status:   Intimate Partner Violence:   . Fear of Current or Ex-Partner:   . Emotionally Abused:   Marland Kitchen Physically Abused:   . Sexually Abused:    Family History  Problem Relation Age of Onset  . Diabetes Father   . Hypertension Maternal Grandmother   . Diabetes Maternal Grandfather   . Hypertension Maternal Grandfather   . Hypertension Paternal Grandmother   . Hypertension Paternal Grandfather   . Kidney cancer Neg Hx   . Kidney disease Neg Hx   . Prostate cancer Neg Hx  Review of Systems  All other systems reviewed and are negative.      Objective:   Physical Exam Vitals reviewed.  Constitutional:      General: He is not in acute distress.    Appearance: He is well-developed. He is not diaphoretic.  HENT:     Head: Normocephalic and atraumatic.  Neck:     Thyroid: No thyromegaly.     Vascular: No JVD.     Trachea: No tracheal deviation.  Cardiovascular:     Rate and Rhythm: Normal rate and regular rhythm.     Heart sounds: Normal heart sounds.  Pulmonary:     Effort: Pulmonary effort is normal. No respiratory distress.     Breath sounds: Normal breath sounds.  Neurological:     Mental Status: He is alert and oriented to person, place, and time.     Cranial Nerves: No cranial nerve deficit.     Motor: No abnormal muscle tone.     Coordination: Coordination normal.      Deep Tendon Reflexes: Reflexes are normal and symmetric.  Psychiatric:        Behavior: Behavior normal.        Thought Content: Thought content normal.        Judgment: Judgment normal.           Assessment & Plan:  Elevated LFTs  Panic attacks  We had a discussion about Xanax and the risk of habituation and dependency.  I explained the role of SSRIs in trying to help prevent and control anxiety.  At the present time, the patient feels more comfortable taking the Xanax sparingly as needed.  We also spent a considerable amount of time discussing fatty liver disease.  The patient needs a diet high in beef and pork and fast food and saturated fat.  He also has elevated weight with a BMI of 37.  I recommended 10 to 15 pounds of weight loss over the next 3 to 4 months.  I recommended 30 minutes a day 5 days a week of aerobic exercise.  I recommended a diet rich in fruits and vegetables and [place] and fish and trying to eat less beef and pork and junk food.  I would like to recheck his liver function test in 3 months after doing this and see if they normalize.  If not I would recommend a right upper quadrant ultrasound.  Also recommended reducing alcohol consumption.

## 2020-05-27 ENCOUNTER — Telehealth: Payer: Self-pay | Admitting: Family Medicine

## 2020-05-27 NOTE — Telephone Encounter (Signed)
Ear infection !! neck Swollen lymph nodes  would like to know if Dr.Pickard prescribe antibody

## 2020-05-27 NOTE — Telephone Encounter (Signed)
Called pt to triage, left message. Informed pt via vm to call for appt due to symptoms

## 2020-05-28 ENCOUNTER — Other Ambulatory Visit: Payer: Self-pay | Admitting: Family Medicine

## 2020-05-28 MED ORDER — ALPRAZOLAM 0.5 MG PO TABS: 0.5000 mg | ORAL_TABLET | Freq: Every evening | ORAL | 0 refills | Status: DC | PRN

## 2020-05-28 NOTE — Telephone Encounter (Signed)
Call placed to patient. LMTRC.  

## 2020-05-28 NOTE — Telephone Encounter (Signed)
Refill xanax

## 2020-05-28 NOTE — Telephone Encounter (Signed)
This pt has never been to this office before would need to est first.

## 2020-05-28 NOTE — Telephone Encounter (Signed)
Please assist in getting pt scheduled.  Message was sent to wrong pool

## 2020-05-28 NOTE — Telephone Encounter (Signed)
Ok to refill??  Last office visit/ refill 12/20/2019.

## 2020-05-29 NOTE — Telephone Encounter (Signed)
Patient contact front office and reports that he was seen elsewhere.

## 2020-06-18 ENCOUNTER — Other Ambulatory Visit: Payer: Self-pay

## 2020-06-18 ENCOUNTER — Telehealth: Payer: Self-pay | Admitting: *Deleted

## 2020-06-18 ENCOUNTER — Encounter: Payer: Self-pay | Admitting: Family Medicine

## 2020-06-18 ENCOUNTER — Encounter: Payer: Self-pay | Admitting: *Deleted

## 2020-06-18 ENCOUNTER — Ambulatory Visit: Payer: PRIVATE HEALTH INSURANCE | Admitting: Family Medicine

## 2020-06-18 VITALS — BP 128/84 | HR 90 | Temp 102.1°F | Resp 18 | Ht 72.0 in | Wt 272.0 lb

## 2020-06-18 DIAGNOSIS — R509 Fever, unspecified: Secondary | ICD-10-CM

## 2020-06-18 NOTE — Telephone Encounter (Signed)
Reviewed RN Rolly Salter note agree with plan of care.  Patient seen at [facility] office and working remotely so cannot have onsite office visits [facility].  Will continue to follow up with patient via telephone while on quarantine.

## 2020-06-18 NOTE — Progress Notes (Signed)
Subjective:    Patient ID: Bruce Kemp, male    DOB: 1985/10/13, 35 y.o.   MRN: [id number]  HPI Symptoms began Monday with body aches and chills and fever.  He now has a cough productive of yellow mucus and rhinorrhea and head congestion.  He continues to have fever and body aches.  He denies any chest pain or shortness of breath.  He does report shaking chills.  He denies any nausea vomiting or diarrhea.  He has had all 3 doses of his Covid vaccine.  He denies any known exposure. Past Medical History:  Diagnosis Date  . Anxiety   . Asthma   . GERD (gastroesophageal reflux disease)    No past surgical history on file. Current Outpatient Medications on File Prior to Visit  Medication Sig Dispense Refill  . acetaminophen (TYLENOL) 650 MG CR tablet Take 650 mg by mouth every 8 (eight) hours as needed for pain.    Marland Kitchen ALPRAZolam (XANAX) 0.5 MG tablet Take 1 tablet (0.5 mg total) by mouth at bedtime as needed for anxiety. 30 tablet 0  . ondansetron (ZOFRAN-ODT) 4 MG disintegrating tablet Take 4 mg by mouth every 6 (six) hours as needed.     No current facility-administered medications on file prior to visit.   No Known Allergies Social History   Socioeconomic History  . Marital status: Single    Spouse name: Not on file  . Number of children: Not on file  . Years of education: Not on file  . Highest education level: Not on file  Occupational History  . Occupation: Engineer, manufacturing: [employer]  Tobacco Use  . Smoking status: Current Some Day Smoker    Types: Cigarettes  . Smokeless tobacco: Never Used  Vaping Use  . Vaping Use: Never used  Substance and Sexual Activity  . Alcohol use: Yes    Comment: socially  . Drug use: No  . Sexual activity: Yes    Birth control/protection: Pill  Other Topics Concern  . Not on file  Social History Narrative  . Not on file   Social Determinants of Health   Financial Resource Strain: Not on file  Food Insecurity: Not  on file  Transportation Needs: Not on file  Physical Activity: Not on file  Stress: Not on file  Social Connections: Not on file  Intimate Partner Violence: Not on file   Family History  Problem Relation Age of Onset  . Diabetes Father   . Hypertension Maternal Grandmother   . Diabetes Maternal Grandfather   . Hypertension Maternal Grandfather   . Hypertension Paternal Grandmother   . Hypertension Paternal Grandfather   . Kidney cancer Neg Hx   . Kidney disease Neg Hx   . Prostate cancer Neg Hx       Review of Systems  All other systems reviewed and are negative.      Objective:   Physical Exam Vitals reviewed.  Constitutional:      General: He is not in acute distress.    Appearance: He is well-developed. He is not diaphoretic.  HENT:     Head: Normocephalic and atraumatic.     Right Ear: Tympanic membrane and ear canal normal.     Left Ear: Tympanic membrane and ear canal normal.     Nose: Congestion and rhinorrhea present.     Mouth/Throat:     Pharynx: No oropharyngeal exudate or posterior oropharyngeal erythema.  Eyes:     Conjunctiva/sclera: Conjunctivae  normal.  Neck:     Thyroid: No thyromegaly.     Vascular: No JVD.     Trachea: No tracheal deviation.  Cardiovascular:     Rate and Rhythm: Normal rate and regular rhythm.     Pulses: Normal pulses.     Heart sounds: Normal heart sounds.  Pulmonary:     Effort: Pulmonary effort is normal. No respiratory distress.     Breath sounds: Normal breath sounds. No wheezing, rhonchi or rales.  Abdominal:     General: Bowel sounds are normal.     Palpations: Abdomen is soft.  Neurological:     Mental Status: He is alert.     Motor: No weakness.     Coordination: Coordination is intact.  Psychiatric:        Judgment: Judgment normal.           Assessment & Plan:  Fever, unspecified fever cause - Plan: SARS-COV-2 RNA,(COVID-19) QUAL NAAT  I feel certain the patient likely has Covid despite being fully  vaccinated.  I recommended ibuprofen 800 mg every 8 hours for fever along with Tylenol 650 mg every 6 hours for fever.  Push fluids.  Also gave him contact information for possible monoclonal antibody infusion through a local urgent care.  Recommended going to the emergency room if he develops shortness of breath or chest pain

## 2020-06-18 NOTE — Telephone Encounter (Signed)
Pt's significant other that also works at [employer], called RN looking for guidance on quarantine and reported that pt was being tested for Covid. Pt works remotely, has not been on-site in over a year.  Spoke with pt by phone. He reports fatigue and that started Monday 06/15/20. Sore throat, fever, body aches developed yesterday. He has a cough that is productive as of today. He was seen at pcp this morning. Temp 102 there. They feel he could have Covid and performed testing. He has been vaxxed x3.   Day 1 06/16/20 Day 5 06/20/20 Testing today 1/6 at pcp. RTW on-site (if needed) 06/20/20 with strict mask use thru 06/25/20

## 2020-06-20 ENCOUNTER — Other Ambulatory Visit: Payer: Self-pay | Admitting: Registered Nurse

## 2020-06-20 DIAGNOSIS — J209 Acute bronchitis, unspecified: Secondary | ICD-10-CM

## 2020-06-20 MED ORDER — ALBUTEROL SULFATE 108 (90 BASE) MCG/ACT IN AEPB: 1.0000 | INHALATION_SPRAY | RESPIRATORY_TRACT | 0 refills | Status: DC | PRN

## 2020-06-20 MED ORDER — PREDNISONE 10 MG (21) PO TBPK: ORAL_TABLET | ORAL | 0 refills | Status: AC

## 2020-06-20 NOTE — Telephone Encounter (Signed)
Telephone message left for patient checking in to see if any questions or concerns as most [facility] offices closed over the weekend.  He can contact me via [phone] this weekend if questions/concerns arise.  No voicemail but if missed call will return as soon as I can.

## 2020-06-20 NOTE — Telephone Encounter (Signed)
Pharmacy requested substitution rx signed electronically.

## 2020-06-20 NOTE — Telephone Encounter (Signed)
Patient returned call.  Patient reported feeling a little better today but now symptoms in lungs per patient.  Feeling wheezing with deep breathing, some productive cough.  Tessalon pearles not helping but still taking them.  Honey helping more po prn.   Mucinex OTC.  Lives alone.  Doesn't have sp02 monitor. Will have RN Hildred Alamin mail him loaner from clinic on Monday.   He states this doesn't feel like covid he had last year.   Can't find inhaler from last year wondering if new Rx can be sent in.  Feels like he needs steroids again did 6/5/4/3/2/1 last year.   Wondering if the flu.  Symptoms started Monday did teledoc then saw his PCM later in the week.  Still waiting for his covid test results.  Has fireplace burning for extra heat today.  Discussed with patient could be flu/covid or other virus.  Using 1 spray flonase daily.  Mucinex helping break things up, some sinus pressure/pain at times.  Electronic rx  Prednisone taper 10mg  (60/50/40/30/20/10mg ) po daily with breakfast #21 RF0.  Discussed possible side effects increased/decreased appetite, difficulty sleeping, increased blood sugar, increased blood pressure and heart rate.  Albuterol MDI 55mcg 1-2 puffs po q4-6h prn protracted cough/wheeze #1 RF0 side effect increased heart rate. Bronchitis simple, community acquired, may have started as viral (probably respiratory syncytial, parainfluenza, influenza, or adenovirus), but now evidence of acute purulent bronchitis with resultant bronchial edema and mucus formation.  Viruses are the most common cause of bronchial inflammation in otherwise healthy adults with acute bronchitis.  The appearance of sputum is not predictive of whether a bacterial infection is present.  Purulent sputum is most often caused by viral infections.  There are a small portion of those caused by non-viral agents being Mycoplama pneumonia.  Microscopic examination or C&S of sputum in the healthy adult with acute bronchitis is generally not  helpful (usually negative or normal respiratory flora) other considerations being cough from upper respiratory tract infections, sinusitis or allergic syndromes (mild asthma or viral pneumonia).  Differential Diagnoses:  reactive airway disease (asthma, allergic aspergillosis (eosinophilia), chronic bronchitis, respiratory infection (sinusitis, common cold, pneumonia), congestive heart failure, reflux esophagitis, bronchogenic tumor, aspiration syndromes and/or exposure to pulmonary irritants/smoke. Without high fever, severe dyspnea, lack of physical findings or other risk factors, I will hold on a chest radiograph and CBC at this time.  I discussed that approximately 50% of patients with acute bronchitis have a cough that lasts up to three weeks, and 25% for over a month.  Tylenol 500mg  one to two tablets every four to six hours as needed for fever or myalgias.  Discussed increase flonase to 1 spray each nostril BID.  Very light inhale don't want it to be pulled to throat but stay in nose/sinuses.  Discussed start humidifier use in bedroom at night.  Consider turning off fireplace if cough worse from wood burning.  Continue mucinex, cough drops, vicks vapor rub, and honey prn.  Discussed covid can be variable as can the flu with symptoms.  Stay hydrated/eat regular meals.   ER if hemopthysis, SOB, worst chest pain of life.   Patient instructed to call back if new/worsening symptoms despite plan of care.  A&Ox3 respirations even and unlabored some nasal congestion rare nonproductive cough during 15 minute telephone call.  No audible wheezing, throat clearing or nasal sniffing noted.  Spoke full sentences without difficulty.    Patient verbalized agreement and understanding of treatment plan.  P2:  hand washing and cover  cough

## 2020-06-21 LAB — SARS-COV-2 RNA,(COVID-19) QUALITATIVE NAAT: SARS CoV2 RNA: NOT DETECTED

## 2020-06-21 NOTE — Telephone Encounter (Signed)
Spoke with pt by phone. He reports not much change today compared to yesterday when he spoke to [place]. Reports sinus sx mostly resolved. Now most prominent sx in chest/lungs, cough. Did start Prednisone today but only took 40mg  as he was reading the back of the package that said to spread the dose throughout the day. Advised he can take at once just make sure it is in the morning to avoid affecting his sleep. He is going to stay at 40mg  today and then continue taper 50/40/30/20/10 tomorrow morning. Using Albuterol inh with some relief. Chest doesn't feel as tight. Will mail spO2 monitor when back in clinic tomorrow and f/u with pt by phone again tomorrow. No further needs at this time.

## 2020-06-22 ENCOUNTER — Ambulatory Visit: Payer: Self-pay | Admitting: Family Medicine

## 2020-06-22 ENCOUNTER — Other Ambulatory Visit: Payer: Self-pay | Admitting: *Deleted

## 2020-06-22 DIAGNOSIS — R059 Cough, unspecified: Secondary | ICD-10-CM

## 2020-06-22 NOTE — Telephone Encounter (Signed)
Chest congestion persists but slightly better. Cough not as productive. Taking Dayquil, using Albuterol inh, Prednisone. Inhaler only 1-2x/day. Recommended increasing to q4h prn. Not taking Tessalon pearles as he didn't feel they were helping. Spacing Prednisone out over several hours as he feels too jittery/anxious when taking higher doses at once.  PCP recommending chest xray. Pt calling requesting advisement and questioning cost. Advised that RN agrees with plan for chest xray to r/o pna. Gave pt phone number for [employer] and order name and ICD code for cost estimate. Pt to call today and will plan on obtaining xray tomorrow.  Plan to f/u tomorrow.

## 2020-06-22 NOTE — Telephone Encounter (Signed)
Reviewed RN Haley note agree with plan of care. 

## 2020-06-23 ENCOUNTER — Ambulatory Visit
Admission: RE | Admit: 2020-06-23 | Discharge: 2020-06-23 | Disposition: A | Discharge status: Home / Self Care | Payer: PRIVATE HEALTH INSURANCE | Source: Ambulatory Visit | Attending: Family Medicine | Admitting: Family Medicine

## 2020-06-23 DIAGNOSIS — R059 Cough, unspecified: Secondary | ICD-10-CM

## 2020-06-23 NOTE — Telephone Encounter (Signed)
Spoke with pt by phone. He reports feeling about the same as yesterday. Continuing same meds. Had xray performed this morning. Awaiting results and recommendations from pcp.

## 2020-06-24 NOTE — Telephone Encounter (Signed)
Patient contacted via telephone.  Discussed chest xray results no pneumonia/fluid in lungs.  Looked at images bronchials inflamed.   Runs out of energy mid day.  Cough least in am.  More in the pm.  Using albuterol inhaler throughout the day.  3 days bad sinus congestion last week then improved but nose still congested but not sinuses.  Using flonase 1 spray twice a day.  Nasal saline using twice a day.  DIscussed increase frequency use would be helpful for nasal congestion may use q2h.  Once a night having coughing fit that is waking him up.  Discussed use albuterol inhaler when this happens for bronchospasm beneficial keep inhaler at the bedside.  Daytime mucinex helping a lot also OTC.  Past several days cough was nonproductive  Today a little bit coming up. Sometimes feels like throat closing with coughing.  Had trouble sleeping with 60/50mg  prednsione but better now on lower dose.  Discussed will extend prednisone taper does not want promethazine liquid at this time.  Will consider sudafed for congestion if worsening as has taken before without difficulty.  Patient stated give girlfriend Ayah prednisone tomorrow from [facility] and she will deliver to him.  Spoke full sentences without difficulty some nasal congestion and rare nonproductive nonharsh cough noted during 15 minute telephone call.  Patient agreed with plan of care and had no further questions at this time.  Verbalized understanding of information/instructions.  CLINICAL DATA:  Cough  EXAM: CHEST - 2 VIEW  COMPARISON:  10/10/2019  FINDINGS: The heart size and mediastinal contours are within normal limits. Both lungs are clear. The visualized skeletal structures are unremarkable.  IMPRESSION: No active cardiopulmonary disease.   Electronically Signed   By: Donavan Foil M.D.   On: 06/23/2020 15:27

## 2020-06-25 NOTE — Telephone Encounter (Signed)
Prednisone 10mg  po 3 tabs x3days, then 2 tabs x3 days, then 1 tab x3 days #21 RF0 dispensed from [facility] stock and given to girlfriend Ayah per pt's request.

## 2020-06-27 NOTE — Telephone Encounter (Signed)
Patient contacted via telephone and he reported has only required albuterol inhaler once today.  Productive cough continues.  Taking prednisone and it is helping.  Fatigue improving.  Coughed up some blood specked phlegm am on first awakening.  Spoke full sentences without difficulty.  Some throat clearing noted during telephone call duration 4 minutes.  No nasal clearing noted.  Patient denied fever/chills/n/v/d.  Still notes winded easily with activity.  sp02 running 95% RA past 2 days.  97-98% RA while speaking to me on phone "highest it has read since I received monitor from clinic"  Patient to call me at [phone] if new or worsening symptoms this weekend.  Patient verbalized understanding information/instructions, agreed with plan of care and had no further questions at this time.  HR notified symptoms improving.

## 2020-06-29 NOTE — Telephone Encounter (Signed)
Spoke with pt by phone. Reports fatigue improving. Cough more productive but moving to get mucus out of chest. Working [employer] today and feeling well so far. Feels like he has improved the most to date. Slowly increasing physical activity so as not to get too winded. Continuing prednisone daily and albuterol prn. Will continue daily f/u for now.

## 2020-06-30 NOTE — Telephone Encounter (Signed)
Reviewed RN Hildred Alamin note symptoms improving working from home continued prednisone and albuterol use

## 2020-07-02 NOTE — Telephone Encounter (Signed)
Reviewed RN Hildred Alamin note agree with plan of care and patient has completed 10 day quarantine and continuing to work remotely.

## 2020-07-02 NOTE — Telephone Encounter (Signed)
Pt reports feeling well today. Still improving from earlier in the week. Congestion, mucus, and cough really improved today. Advised pt he can send pulse oximeter back when he is able since he stayed in high 90's for all readings and URI sx are improved. He sts he will and verbalizes appreciation for clinic staff involvement and pt care. No further needs or concerns. He confirms he has clinic phone and email contact info and will reach out if any sx worsen, recur or any concerns develop. Closing encounter.

## 2020-07-12 NOTE — Telephone Encounter (Signed)
Patient had some symptoms Friday and Saturday again with post nasal drip and cough was running fireplace for heat again.  Discussed restarting flonase/nasal saline/humidifier/tessalon pearles and inhaler if any protracted cough/wheezing/chest tightness.  Patient respirations even and unlabored, no cough during 3 minute telephone conversation.  He thinks related to fireplace use at this time otherwise feeling well.  Not going to use fireplace tonight to see if symptoms improve.  Patient to contact clinic if symptoms worsening or new.  Patient verbalized understanding information/instructions, agreed with plan of care and had no further questions at this time.

## 2020-07-26 ENCOUNTER — Telehealth: Payer: Self-pay | Admitting: Registered Nurse

## 2020-07-26 DIAGNOSIS — U071 COVID-19: Secondary | ICD-10-CM

## 2020-07-26 NOTE — Telephone Encounter (Signed)
Patient reported girlfriend positive for covid and he had positive test also home.  Symptoms nasal congestion using dayquil.  Discussed flonase and nasal saline use add if dayquil not clearing congestion or worsening rhinitis.  Patient spoke full sentences without difficulty A&Ox3 no cough during 3 minute telephone call.  Quarantine for 5 days post test/until symptoms improving.  Mask use when around others for 10 days after positive test.  Disinfect high touch surfaces daily with lysol/bleach/clorox spray/wipes during quarantine.  Hydrate to avoid dehydration.  Patient verbalized understanding information/instructions, agreed with plan of care and had no further questions at this time.

## 2020-07-30 ENCOUNTER — Encounter: Payer: Self-pay | Admitting: Registered Nurse

## 2020-07-30 NOTE — Telephone Encounter (Signed)
Spoke with patient via telephone for 8 minutes.  Still having nasal congestion and rhinitis/post nasal drip.  Denied fever/chills/dyspnea/dysphasia/dysphagia/shortness of breath/chest pain.  Some fatigue.  He reports symptoms very similar to January illness.  He was surprised his home covid test was positive on 26 Jul 2020.  Girlfriend had positive test prior to him and had been quarantining at his home because her parents both tested positive.   Has been working remote as scheduled.  No plans to return onsite in the near future.  Day 0 symptoms started 23 Jul 2020  Day 5 2/15  Day 10 2/20.  Discussed with patient coronavirus also causes common cold so not surprising symptoms similar.  Patient reported not using tessalon pearles as they didn't seem to help and mucous thin so not needing mucinex at this time.  Some cough with this illness episode also but not as much.  Flonase not every day or nasal saline.  Dayquil OTC po prn.  A&Ox3 respirations even and unlabored on RA spoke full sentences without difficulty some nasal congestion noted no throat clearing/cough or nasal sniffing during telephone call auditory.  Patient to contact clinic staff if new or worsening symptoms.  Patient verbalized understanding information/instructions, agreed with plan of care and had no further questions at this time.  HR notified patient cleared to return onsite if needed with strict mask use through 08/02/2020

## 2020-08-03 NOTE — Telephone Encounter (Signed)
Spoke with pt by phone. He reports sx improving. Cough resolved. Only with PND, thin mucus now. Not taking any OTCs at this time. Denies any current needs or concerns. Closing encounter.

## 2020-08-03 NOTE — Telephone Encounter (Signed)
Reviewed RN Hildred Alamin note agreed with plan of care.  Symptoms improving.

## 2020-09-03 ENCOUNTER — Ambulatory Visit: Payer: Self-pay | Admitting: *Deleted

## 2020-09-03 ENCOUNTER — Other Ambulatory Visit: Payer: Self-pay

## 2020-09-03 VITALS — BP 150/98 | Ht 73.0 in | Wt 284.0 lb

## 2020-09-03 DIAGNOSIS — Z0000 Encounter for general adult medical examination without abnormal findings: Secondary | ICD-10-CM

## 2020-09-03 NOTE — Progress Notes (Signed)
[employer] insurance premium discount evaluation: Labs Drawn. Replacements ROI form signed. Tobacco Free Attestation form signed.  Forms placed in paper chart.  

## 2020-09-04 ENCOUNTER — Telehealth: Payer: Self-pay | Admitting: Registered Nurse

## 2020-09-04 DIAGNOSIS — Z6837 Body mass index (BMI) 37.0-37.9, adult: Secondary | ICD-10-CM

## 2020-09-04 LAB — CMP12+LP+TP+TSH+6AC+CBC/D/PLT
ALT: 71 IU/L — ABNORMAL HIGH (ref 0–44)
AST: 41 IU/L — ABNORMAL HIGH (ref 0–40)
Albumin/Globulin Ratio: 1.8 (ref 1.2–2.2)
Albumin: 4.4 g/dL (ref 4.0–5.0)
Alkaline Phosphatase: 54 IU/L (ref 44–121)
BUN/Creatinine Ratio: 11 (ref 9–20)
BUN: 10 mg/dL (ref 6–20)
Basophils Absolute: 0 10*3/uL (ref 0.0–0.2)
Basos: 0 %
Bilirubin Total: 0.8 mg/dL (ref 0.0–1.2)
Calcium: 9.5 mg/dL (ref 8.7–10.2)
Chloride: 99 mmol/L (ref 96–106)
Chol/HDL Ratio: 5.8 ratio — ABNORMAL HIGH (ref 0.0–5.0)
Cholesterol, Total: 232 mg/dL — ABNORMAL HIGH (ref 100–199)
Creatinine, Ser: 0.89 mg/dL (ref 0.76–1.27)
EOS (ABSOLUTE): 0.3 10*3/uL (ref 0.0–0.4)
Eos: 5 %
Estimated CHD Risk: 1.2 times avg. — ABNORMAL HIGH (ref 0.0–1.0)
Free Thyroxine Index: 1.5 (ref 1.2–4.9)
GGT: 38 IU/L (ref 0–65)
Globulin, Total: 2.5 g/dL (ref 1.5–4.5)
Glucose: 90 mg/dL (ref 65–99)
HDL: 40 mg/dL (ref >39)
Hematocrit: 45.9 % (ref 37.5–51.0)
Hemoglobin: 15.7 g/dL (ref 13.0–17.7)
Immature Grans (Abs): 0 10*3/uL (ref 0.0–0.1)
Immature Granulocytes: 0 %
Iron: 93 ug/dL (ref 38–169)
LDH: 188 IU/L (ref 121–224)
LDL Chol Calc (NIH): 167 mg/dL — ABNORMAL HIGH (ref 0–99)
Lymphocytes Absolute: 2 10*3/uL (ref 0.7–3.1)
Lymphs: 33 %
MCH: 30.3 pg (ref 26.6–33.0)
MCHC: 34.2 g/dL (ref 31.5–35.7)
MCV: 89 fL (ref 79–97)
Monocytes Absolute: 0.5 10*3/uL (ref 0.1–0.9)
Monocytes: 8 %
Neutrophils Absolute: 3.3 10*3/uL (ref 1.4–7.0)
Neutrophils: 54 %
Phosphorus: 2.7 mg/dL — ABNORMAL LOW (ref 2.8–4.1)
Platelets: 304 10*3/uL (ref 150–450)
Potassium: 4.3 mmol/L (ref 3.5–5.2)
RBC: 5.18 x10E6/uL (ref 4.14–5.80)
RDW: 12.4 % (ref 11.6–15.4)
Sodium: 139 mmol/L (ref 134–144)
T3 Uptake Ratio: 24 % (ref 24–39)
T4, Total: 6.2 ug/dL (ref 4.5–12.0)
TSH: 1.4 u[IU]/mL (ref 0.450–4.500)
Total Protein: 6.9 g/dL (ref 6.0–8.5)
Triglycerides: 138 mg/dL (ref 0–149)
Uric Acid: 4 mg/dL (ref 3.8–8.4)
VLDL Cholesterol Cal: 25 mg/dL (ref 5–40)
WBC: 6.1 10*3/uL (ref 3.4–10.8)
eGFR: 115 mL/min/{1.73_m2} (ref >59)

## 2020-09-04 LAB — HGB A1C W/O EAG: Hgb A1c MFr Bld: 5.5 % (ref 4.8–5.6)

## 2020-10-01 NOTE — Telephone Encounter (Signed)
Patient read my chart 09/04/2020 per Epic

## 2020-11-02 ENCOUNTER — Ambulatory Visit: Payer: PRIVATE HEALTH INSURANCE | Admitting: Nurse Practitioner

## 2020-11-02 ENCOUNTER — Other Ambulatory Visit: Payer: Self-pay

## 2020-11-02 VITALS — BP 118/76 | Temp 98.1°F | Ht 73.0 in | Wt 284.4 lb

## 2020-11-02 DIAGNOSIS — H04203 Unspecified epiphora, bilateral lacrimal glands: Secondary | ICD-10-CM | POA: Diagnosis not present

## 2020-11-02 DIAGNOSIS — D367 Benign neoplasm of other specified sites: Secondary | ICD-10-CM | POA: Diagnosis not present

## 2020-11-02 NOTE — Progress Notes (Signed)
Subjective:    Patient ID: Bruce Kemp, male    DOB: 07/05/1985, 35 y.o.   MRN: [id number]  HPI: Bruce Kemp is a 35 y.o. male presenting for cyst.  Chief Complaint  Patient presents with  . Cyst    Left side of thigh noticed last sunday   SKIN INFECTION Duration:  Days - noticed last weekend 5/17 after using the bathroom Location: underside of left thigh near buttock History of trauma in area: no Pain: yes previously, none now unless touching Quality: n/a Severity: no pain Redness: no - was red at first; not anymore Swelling: yes - worst last weekend Oozing: no Pus: no Fevers: no Nausea/vomiting: no Status: better Treatments attempted: nothing tried   WATERY EYES Onset: years  Location: both eyes ; L eye worse Duration: constant throughout the day all day  Pain: no Characteristics: no itching, redness, nasal congestion/stuffiness Timing: random times throughout the day Aggravating Factors: laugh  Relieving Factors: not tried anything Foreign body sensation: no  No Known Allergies  Outpatient Encounter Medications as of 11/02/2020  Medication Sig  . ALPRAZolam (XANAX) 0.5 MG tablet Take 1 tablet (0.5 mg total) by mouth at bedtime as needed for anxiety.  . cetirizine (ZYRTEC) 10 MG tablet Take 10 mg by mouth daily. (Patient not taking: No sig reported)  . etodolac (LODINE) 500 MG tablet Take 500 mg by mouth 2 (two) times daily.  . methylphenidate (METADATE CD) 20 MG CR capsule Take 20 mg by mouth daily. (Patient not taking: Reported on 11/02/2020)  . [DISCONTINUED] methylphenidate (RITALIN) 5 MG tablet Take 5 mg by mouth daily.   No facility-administered encounter medications on file as of 11/02/2020.    Patient Active Problem List   Diagnosis Date Noted  . SI (sacroiliac) joint inflammation ([place]) 12/20/2016  . SINUSITIS - ACUTE-NOS 12/23/2008  . CERUMEN IMPACTION, LEFT 11/28/2008  . CONTACT DERMATITIS 11/28/2008    Past Medical History:  Diagnosis Date   . Anxiety   . Asthma   . GERD (gastroesophageal reflux disease)     Relevant past medical, surgical, family and social history reviewed and updated as indicated. Interim medical history since our last visit reviewed.  Review of Systems  Constitutional: Negative.  Negative for activity change, appetite change, chills, diaphoresis, fatigue and fever.  HENT: Negative.   Eyes: Positive for discharge (water). Negative for photophobia, pain, redness, itching and visual disturbance.  Respiratory: Negative.   Cardiovascular: Negative.   Endocrine: Negative.   Skin: Positive for wound.  Neurological: Negative.    Per HPI unless specifically indicated above     Objective:    BP 118/76   Temp 98.1 F (36.7 C)   Ht 6\' 1"  (1.854 m)   Wt 284 lb 6.4 oz (129 kg)   BMI 37.52 kg/m   Wt Readings from Last 3 Encounters:  11/02/20 284 lb 6.4 oz (129 kg)  09/03/20 284 lb (128.8 kg)  06/18/20 272 lb (123.4 kg)    Physical Exam Vitals and nursing note reviewed.  Constitutional:      General: He is not in acute distress.    Appearance: Normal appearance. He is not toxic-appearing.  HENT:     Head: Normocephalic and atraumatic.     Right Ear: External ear normal.     Left Ear: External ear normal.     Nose: Nose normal. No congestion.     Mouth/Throat:     Mouth: Mucous membranes are moist.     Pharynx:  Oropharynx is clear.  Eyes:     General: No scleral icterus.       Right eye: No discharge.        Left eye: No discharge.     Extraocular Movements: Extraocular movements intact.     Right eye: Normal extraocular motion.     Left eye: Normal extraocular motion.     Conjunctiva/sclera: Conjunctivae normal.     Right eye: Right conjunctiva is not injected. No exudate or hemorrhage.    Left eye: Left conjunctiva is not injected. No exudate or hemorrhage.    Pupils: Pupils are equal, round, and reactive to light.  Skin:    General: Skin is warm and dry.     Capillary Refill: Capillary  refill takes less than 2 seconds.     Coloration: Skin is not jaundiced or pale.     Findings: Lesion present. No erythema.          Comments: Approximately 0.5 cm x 0.5 cm cyst noted to posterior upper thigh in the area marked above.  No erythema, fluctuance, or drainage noted.  Slightly tender to palpation.  Neurological:     Mental Status: He is alert.  Psychiatric:        Mood and Affect: Mood normal.        Behavior: Behavior normal.        Thought Content: Thought content normal.        Judgment: Judgment normal.       Assessment & Plan:  1. Dermoid cyst of lower extremity, left Acute.  No s/s infection or indications for I&D in clinic today.  Encouraged warm compresses, NSAID as needed for pain.  If persists or worsens, return to clinic for possible I&D.  Discussed that likely definitive therapy would require removal of entire cyst capsule with either general surgery or Dermatology.  Patient will let us know if he would like referral in future.  2. Watery eyes Chronic.  Examination today normal.  Encouraged massaging lacrimal ducts multiple times daily with clean finger to remove potential blocked duct.  Can also trial antihistamine and OTC eye drops.  If symptoms persist, discuss with Optometrist.    Follow up plan: Return if symptoms worsen or fail to improve.

## 2020-11-03 ENCOUNTER — Other Ambulatory Visit: Payer: Self-pay | Admitting: *Deleted

## 2020-11-03 ENCOUNTER — Encounter: Payer: Self-pay | Admitting: *Deleted

## 2020-11-03 DIAGNOSIS — M461 Sacroiliitis, not elsewhere classified: Secondary | ICD-10-CM

## 2020-11-03 MED ORDER — ETODOLAC 500 MG PO TABS: 500.0000 mg | ORAL_TABLET | Freq: Two times a day (BID) | ORAL | 3 refills | Status: DC

## 2020-11-03 NOTE — Telephone Encounter (Signed)
Reviewed labs/LFTs still slightly elevated but improved from last year.  Approved etodolac refill 52m po BID #180 RF3 for patient electronic Rx sent to his pharmacy of choice.  Labs recommended again in one year (Mar 2023) to monitor LFTs sooner if new abdomen pain/jaundice.  Results for Bruce Kemp, Bruce Kemp(MRN [id number] as of 11/03/2020 21:23  Ref. Range 09/03/2020 09:46  Sodium Latest Ref Range: 134 - 144 mmol/L 139  Potassium Latest Ref Range: 3.5 - 5.2 mmol/L 4.3  Chloride Latest Ref Range: 96 - 106 mmol/L 99  Glucose Latest Ref Range: 65 - 99 mg/dL 90  BUN Latest Ref Range: 6 - 20 mg/dL 10  Creatinine Latest Ref Range: 0.76 - 1.27 mg/dL 0.89  Calcium Latest Ref Range: 8.7 - 10.2 mg/dL 9.5  BUN/Creatinine Ratio Latest Ref Range: 9 - 20  11  eGFR Latest Ref Range: >59 mL/min/1.73 115  Phosphorus Latest Ref Range: 2.8 - 4.1 mg/dL 2.7 (L)  Alkaline Phosphatase Latest Ref Range: 44 - 121 IU/L 54  Albumin Latest Ref Range: 4.0 - 5.0 g/dL 4.4  Albumin/Globulin Ratio Latest Ref Range: 1.2 - 2.2  1.8  Uric Acid Latest Ref Range: 3.8 - 8.4 mg/dL 4.0  AST Latest Ref Range: 0 - 40 IU/L 41 (H)  ALT Latest Ref Range: 0 - 44 IU/L 71 (H)  Total Protein Latest Ref Range: 6.0 - 8.5 g/dL 6.9  Total Bilirubin Latest Ref Range: 0.0 - 1.2 mg/dL 0.8  GGT Latest Ref Range: 0 - 65 IU/L 38  Estimated CHD Risk Latest Ref Range: 0.0 - 1.0 times avg. 1.2 (H)  LDH Latest Ref Range: 121 - 224 IU/L 188  Total CHOL/HDL Ratio Latest Ref Range: 0.0 - 5.0 ratio 5.8 (H)  Cholesterol, Total Latest Ref Range: 100 - 199 mg/dL 232 (H)  HDL Cholesterol Latest Ref Range: >39 mg/dL 40  Triglycerides Latest Ref Range: 0 - 149 mg/dL 138  VLDL Cholesterol Cal Latest Ref Range: 5 - 40 mg/dL 25  LDL Chol Calc (NIH) Latest Ref Range: 0 - 99 mg/dL 167 (H)  Iron Latest Ref Range: 38 - 169 ug/dL 93  Globulin, Total Latest Ref Range: 1.5 - 4.5 g/dL 2.5  WBC Latest Ref Range: 3.4 - 10.8 x10E3/uL 6.1  RBC Latest Ref Range: 4.14 - 5.80  x10E6/uL 5.18  Hemoglobin Latest Ref Range: 13.0 - 17.7 g/dL 15.7  HCT Latest Ref Range: 37.5 - 51.0 % 45.9  MCV Latest Ref Range: 79 - 97 fL 89  MCH Latest Ref Range: 26.6 - 33.0 pg 30.3  MCHC Latest Ref Range: 31.5 - 35.7 g/dL 34.2  RDW Latest Ref Range: 11.6 - 15.4 % 12.4  Platelets Latest Ref Range: 150 - 450 x10E3/uL 304  Neutrophils Latest Ref Range: Not Estab. % 54  Immature Granulocytes Latest Ref Range: Not Estab. % 0  NEUT# Latest Ref Range: 1.4 - 7.0 x10E3/uL 3.3  Lymphocyte # Latest Ref Range: 0.7 - 3.1 x10E3/uL 2.0  Monocytes Absolute Latest Ref Range: 0.1 - 0.9 x10E3/uL 0.5  Basophils Absolute Latest Ref Range: 0.0 - 0.2 x10E3/uL 0.0  Immature Grans (Abs) Latest Ref Range: 0.0 - 0.1 x10E3/uL 0.0  Lymphs Latest Ref Range: Not Estab. % 33  Monocytes Latest Ref Range: Not Estab. % 8  Basos Latest Ref Range: Not Estab. % 0  Eos Latest Ref Range: Not Estab. % 5  EOS (ABSOLUTE) Latest Ref Range: 0.0 - 0.4 x10E3/uL 0.3  Hemoglobin A1C Latest Ref Range: 4.8 - 5.6 % 5.5  TSH  Latest Ref Range: 0.450 - 4.500 uIU/mL 1.400  Thyroxine (T4) Latest Ref Range: 4.5 - 12.0 ug/dL 6.2  Free Thyroxine Index Latest Ref Range: 1.2 - 4.9  1.5  T3 Uptake Ratio Latest Ref Range: 24 - 39 % 24

## 2020-11-03 NOTE — Telephone Encounter (Signed)
Pt requested annual refill of Etodoloc. Labs reviewed with NP. Verbal order obtained for refill. Pt made aware.

## 2020-11-27 ENCOUNTER — Other Ambulatory Visit: Payer: Self-pay

## 2020-11-27 MED ORDER — ALPRAZOLAM 0.5 MG PO TABS: 0.5000 mg | ORAL_TABLET | Freq: Every evening | ORAL | 0 refills | Status: DC | PRN

## 2021-04-06 DIAGNOSIS — G479 Sleep disorder, unspecified: Secondary | ICD-10-CM | POA: Insufficient documentation

## 2021-04-13 ENCOUNTER — Other Ambulatory Visit (HOSPITAL_BASED_OUTPATIENT_CLINIC_OR_DEPARTMENT_OTHER): Payer: Self-pay

## 2021-04-13 DIAGNOSIS — G478 Other sleep disorders: Secondary | ICD-10-CM

## 2021-04-13 DIAGNOSIS — R0681 Apnea, not elsewhere classified: Secondary | ICD-10-CM

## 2021-04-13 DIAGNOSIS — R0689 Other abnormalities of breathing: Secondary | ICD-10-CM

## 2021-05-03 ENCOUNTER — Other Ambulatory Visit: Payer: Self-pay

## 2021-05-03 ENCOUNTER — Ambulatory Visit (HOSPITAL_BASED_OUTPATIENT_CLINIC_OR_DEPARTMENT_OTHER): Payer: No Typology Code available for payment source | Attending: Otolaryngology | Admitting: Internal Medicine

## 2021-05-03 DIAGNOSIS — R0689 Other abnormalities of breathing: Secondary | ICD-10-CM | POA: Insufficient documentation

## 2021-05-03 DIAGNOSIS — G4733 Obstructive sleep apnea (adult) (pediatric): Secondary | ICD-10-CM | POA: Insufficient documentation

## 2021-05-03 DIAGNOSIS — R0681 Apnea, not elsewhere classified: Secondary | ICD-10-CM

## 2021-05-03 DIAGNOSIS — G4736 Sleep related hypoventilation in conditions classified elsewhere: Secondary | ICD-10-CM | POA: Insufficient documentation

## 2021-05-03 DIAGNOSIS — G478 Other sleep disorders: Secondary | ICD-10-CM

## 2021-05-04 ENCOUNTER — Telehealth: Payer: Self-pay | Admitting: *Deleted

## 2021-05-04 DIAGNOSIS — J069 Acute upper respiratory infection, unspecified: Secondary | ICD-10-CM

## 2021-05-04 NOTE — Telephone Encounter (Signed)
Pt emailed RN and advised that he has had a cough develop while he has been at work today and is wondering if he needs to go home and work remote this week. Describes cough as dry itchy/scratchy. Also endorses "starting to feel some fatigue, and a small headache, and my chest is feeling a little inflamed."  Advised pt that is preferable if remote work is an option and to do home covid testing this evening. If negative, repeat in 48hrs. Pt given NP Otila Kluver remote work # to call her tomorrow with test result and sx update.

## 2021-05-05 ENCOUNTER — Encounter: Payer: Self-pay | Admitting: *Deleted

## 2021-05-05 MED ORDER — ONDANSETRON 4 MG PO TBDP: 4.0000 mg | ORAL_TABLET | Freq: Three times a day (TID) | ORAL | 0 refills | Status: AC | PRN

## 2021-05-05 MED ORDER — SALINE SPRAY 0.65 % NA SOLN: 2.0000 | NASAL | 0 refills | Status: DC

## 2021-05-05 MED ORDER — FLUTICASONE PROPIONATE 50 MCG/ACT NA SUSP: 1.0000 | Freq: Two times a day (BID) | NASAL | 0 refills | Status: DC

## 2021-05-05 MED ORDER — ACETAMINOPHEN 500 MG PO TABS: 1000.0000 mg | ORAL_TABLET | Freq: Four times a day (QID) | ORAL | 0 refills | Status: AC | PRN

## 2021-05-05 MED ORDER — OSELTAMIVIR PHOSPHATE 75 MG PO CAPS
75.0000 mg | ORAL_CAPSULE | Freq: Two times a day (BID) | ORAL | 0 refills | Status: AC
Start: 2021-05-05 — End: 2021-05-10

## 2021-05-05 MED ORDER — PREDNISONE 10 MG (21) PO TBPK: ORAL_TABLET | ORAL | 0 refills | Status: DC

## 2021-05-05 MED ORDER — DEXTROMETHORPHAN HBR 15 MG/5ML PO SYRP: 10.0000 mL | ORAL_SOLUTION | Freq: Four times a day (QID) | ORAL | 0 refills | Status: DC | PRN

## 2021-05-05 NOTE — Telephone Encounter (Signed)
Patient contacted NP stated [facility] on [address] in [place] has tamiflu and to be please send his Rx to that pharmacy.  Electronic Rx entered for dextromethorphan, flonase, zofran, tamiflu and prednisone taper and sent to patient pharmacy of choice.  Discussed with patient I will contact him again tomorrow to follow up.  Patient may call later today if questions or concerns.  Patient verbalized understanding information/instructions and had no further questions at this time.

## 2021-05-05 NOTE — Telephone Encounter (Signed)
Received message from HR patient fever 102 last night and patient requesting I contact him this am.  Contacted patient via telephone and he reported severe nausea overnight, fever 102, chills, headache and cough continues.  Able to tolerate po intake but cannot take in as much as he would want to due to nausea.  Denied worst headache of life.  Having some dizziness.  Last urinated 1 hour ago did not look at color.  Denied vomiting/diarrhea.  Discussed tylenol 1000mg  po q6h prn pain/fever as still taking etodolac.  Discussed if tea/cola colored, unable to urinate every 8 hours or repetitive vomiting he needs to go to [facility] for IV fluids/evaluation same day.  Patient denied trouble breathing but is having some wheezing with prolonged coughing.  Patient has albuterol inhaler at home from Jan 2022 but has not used it.  Discussed 1-2 puffs every 4-6 hours prn protracted coughing or wheezing.  I recommended continue quarantine at home.  Most likely influenza A if covid test negative.  Will treat with antivirals.  Discussed with patient tamiflu has been in short supply and I may need to contact more than one pharmacy to find stock.  Patient prefers [place] [facility] [place] [facility] locations.  His preferred [facility] out of stock, message left for [employer] [facility]. Plan for repeat covid testing tomorrow at home.  Electronic Rx zofran ODT 4mg  1-2 tabs po q8h prn n/v #12 RF0 sent to patient preferred pharmacy along with prednisone 10mg  taper po daily with breakfast (60/50/40/30/20/10) #21 RF0 to start if worsening cough/wheezing.    Patient to contact me if vomiting after coughing or unable to tolerate po fluids.  Discussed flu and other viral illnesses RSV/Covid circulating in community and some causing GI upset.  If GI upset I have recommended clear fluids then bland diet.  Avoid dairy/spicy, fried and large portions of meat while having nausea.  If vomiting hold po intake x 1 hour.  Then sips clear fluids  like broths, ginger ale, power ade, gatorade, pedialyte may advance to soft/bland if no vomiting x 24 hours and appetite returned otherwise hydration main focus. Call me at work from home number if symptoms not improved with plan of care  patient to call if high fever, dehydration, marked weakness, fainting, increased abdominal pain, blood in stool or vomit (red or black).   Reviewed same day/emergent eval/ER precautions of dizziness/syncope, confusion, blue tint to lips/face, severe shortness of breath/difficulty breathing/wheezing.    Patient lives alone.  Wear mask if others visiting.  Sanitize high touch surfaces with lysol/chlorox/bleach spray or wipes daily as viruses are known to live on surfaces from 24 hours to days.  Discussed robitussin DM OTC for cough/cold use and is working for him. May use flonase nasal 70mcg 1 spray each nostril BID prn rhinitis.   Patient has used tessalon pearles in the past and they didn't work well for him Discussed honey 1 tablespoon every 4 hours is a natural cough suppressant but caution due to his diabetes.  Avoid dehydration and drink water to keep urine pale yellow clear and voiding every 2-4 hours while awake.  Patient alert and oriented x3, spoke full sentences without difficulty.  Some intermittent mild cough and nasal congestion noted during 11 minute telephone call.  Discussed with patient he can contact me at this time number [phone] until Tuesday 11/29 as RN Hildred Alamin on vacation and clinic closed due to Thanksgiving.  Pt verbalized understanding and agreement with plan of care. No further questions/concerns at  this time. Pt reminded to contact clinic with any changes in symptoms or questions/concerns. HR notified patient still has fever, re-evaluation tomorrow along with repeat covid testing not cleared to return onsite.

## 2021-05-05 NOTE — Telephone Encounter (Signed)
Patient contacted via phone notified [facility] staff has not answered or returned my call awaiting reply if tamiflu available.  Will call another pharmacy if no reply by 1000.  Discussed his pharmacy did not have tamiflu.  Patient stated he found old box tamiflu at home expired 2019 and asked if he should take it.  Discussed with patient that if stored in cool dry place studies have shown 1 year past expiration date medication loses approximately 25% efficacy and increases with each subsequent year so that medicine may only be 20-30 effective as 3 years past expiration date.  Other alternative is xofluza but copay higher $60.  Patient stated is tamiflu unable he would like rx for xofluza.  Patient A&Ox3 spoke full sentences without difficulty no cough audibile during 3 minute telephone call.  Patient notified I would call him once I was able to find pharmacy with his medication in stock.  Patient verbalized understanding information/instructions, agreed with plan of care and had no further questions at this time.

## 2021-05-07 NOTE — Telephone Encounter (Signed)
Patient contacted via telephone stated feeling better today medicine helping and no fever in the past 24 hours.  Started his tamiflu on 05/05/21 and nausea resolved also.  Working from home today remote.  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  Repeat covid test yesterday negative.  Stated prednisone is interrupting his sleep.    Used nyquil last night to help with cough and sleeping worked well.  Discussed with patient as long as no n/v/d/fever or chills 24 hours prior to 05/10/21 or new/worsening symptoms may return onsite with mask wear if still having symptoms.  If having symptoms no eating in lunchroom until Day 10 14 May 2021  Patient A&Ox3 spoke full sentences without difficulty mild congestion noted during 4 minute telephone call no throat clearing/wheezing/shortness of breath or cough audible.  Discussed with patient to take his prednisone in am to help decrease sleep impact.  He stated pharmacist told him to split up pills am, lunch and dinner.  Discussed with him can skip dinner pills or just take all in am with food if feeling better symptoms improved may not need steroids any longer also.  HR notified patient improved and cleared to RTW onsite 11/28 as long as no new or worsening symptoms will re-evaluate 11/27 via telephone.  Patient verbalized understanding information/instructions, agreed with plan of care and had no further questions at this time.

## 2021-05-09 NOTE — Telephone Encounter (Signed)
Patient contacted via telephone stated felt like went backwards yesterday more mucous and coughing and fatigue.  He was going to go out to do errands yesterday but ended up staying home.  He reported he stopped taking prednisone after we last spoke on 11/25 due to insomnia.  Has 12 pills remaining 10mg .  Discussed with patient will do lower dose prednisone 10mg  today since afternoon already, 30mg  x 2 days 11/28 & 11/29, 20mg  x2 days 11/30 12/1 and 1 pill 10mg  12/2.  Patient may continue flonase/nasal saline/honey/cough drops/nyquil.  Discussed if mucous thick/tan/bloody/cloudy to notify me or if wheezing/shortness of breath noted.  Discussed viral mucous typically clear to yellow than green than done.  Patient scheduled to be onsite tomorrow discussed mask wear and if waking up am feels worse to follow normal call out procedures as he feels like he would be able to complete his duties if he feels like he does today.  HR notified cleared to be onsite with mask wear and will call out sick if worsening symptoms tomorrow.  Patient A&Ox3 spoke full sentences without difficulty some congestion/throat clearing noted during 6 minute telephone call.  No cough audible.  Patient verbalized understanding information/instructions, agreed with plan of care and had no further questions at this time.

## 2021-05-11 NOTE — Telephone Encounter (Signed)
Spoke with pt by phone. He reports he still feels run down, fatigued, not sleeping well due to cough. Hoping things will start improving this week since sx present for over a week now. Continues all Rx'd meds. Declined tessalon again reporting it has not worked well for him in the past on multiple occasions. 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 Left about an hour early today due to fatigue. Working remote rest of this week. Denies any further needs or concerns.

## 2021-05-14 NOTE — Telephone Encounter (Signed)
Will follow up with patient via telephone tomorrow consider phenergan liquid for HS cough use prn.

## 2021-05-15 DIAGNOSIS — R0681 Apnea, not elsewhere classified: Secondary | ICD-10-CM

## 2021-05-15 NOTE — Procedures (Signed)
    Patient Name: Bruce Kemp, [place] Date: 05/03/2021 Gender: Male D.O.B: 1985/12/31 Age (years): 35 Referring Provider: Melida Quitter Height (inches): 56 Interpreting Physician: Baird Lyons MD, ABSM Weight (lbs): 280 RPSGT: Gerhard Perches BMI: 36 MRN: [id number] Neck Size: 19.50  CLINICAL INFORMATION Sleep Study Type: HST Indication for sleep study: OSA Epworth Sleepiness Score: 6  SLEEP STUDY TECHNIQUE A multi-channel overnight portable sleep study was performed. The channels recorded were: nasal airflow, thoracic respiratory movement, and oxygen saturation with a pulse oximetry. Snoring was also monitored.  MEDICATIONS Patient self administered medications include: none reported.  SLEEP ARCHITECTURE Patient was studied for 364 minutes. The sleep efficiency was 100.0 % and the patient was supine for 76.1%. The arousal index was 0.0 per hour.  RESPIRATORY PARAMETERS The overall AHI was 70.9 per hour, with a central apnea index of 0.2 per hour. The oxygen nadir was 84% during sleep.  CARDIAC DATA Mean heart rate during sleep was 77.1 bpm.  IMPRESSIONS - Severe obstructive sleep apnea occurred during this study (AHI = 70.9/h). - No significant central sleep apnea occurred during this study (CAI = 0.2/h). - Moderate oxygen desaturation was noted during this study (Min O2 = 84%).Mean O2 saturation 93%. Time with O2 saturation 89% or less was 10.2 minutes. - Patient snored.  DIAGNOSIS - Obstructive Sleep Apnea (G47.33) - Nocturnal Hypoxemia (G47.36)  RECOMMENDATIONS - Suggest CPAP titrtation sleep study or autopap. Other options would be based on clinical judgment. - Be careful with alcohol, sedatives and other CNS depressants that may worsen sleep apnea and disrupt normal sleep architecture. - Sleep hygiene should be reviewed to assess factors that may improve sleep quality. - Weight management and regular exercise should be initiated or continued.  [Electronically  signed] 05/15/2021 01:03 PM  Baird Lyons MD, [place], American Board of Sleep Medicine   NPI: [id number]                         [place], [place] [facility]  ELECTRONICALLY SIGNED ON:  05/15/2021, 1:01 PM [place] PH: [phone]   FX: [phone] [place]

## 2021-05-20 NOTE — Telephone Encounter (Signed)
Patient contacted via telephone stated still having some cough clear phlegm but able to sleep at night.  Fatigue improved back to 90% energy level.  "Doing better"  Denied concerns, did not want phenergan liquid Rx.  Day 0 was 05/05/21 Discussed contact clinic if worsening cough or symptoms develop.  Patient A&OX3 spoke full sentences without difficulty; no audible wheeze/cough/congestion/throat clearing or shortness of breath during 2 minute telephone call.  Patient verbalized understanding information/instructions, agreed with plan of care and had no further questions at this time.

## 2021-05-26 ENCOUNTER — Other Ambulatory Visit: Payer: Self-pay

## 2021-05-26 NOTE — Telephone Encounter (Signed)
LOV for anxiety 12/20/19 Last refill 11/27/20, #30, 0 refills  Please review, thanks!

## 2021-05-27 MED ORDER — ALPRAZOLAM 0.5 MG PO TABS: 0.5000 mg | ORAL_TABLET | Freq: Every evening | ORAL | 0 refills | Status: DC | PRN

## 2021-06-02 ENCOUNTER — Ambulatory Visit: Payer: No Typology Code available for payment source | Admitting: Nurse Practitioner

## 2021-06-02 ENCOUNTER — Other Ambulatory Visit: Payer: Self-pay

## 2021-06-02 ENCOUNTER — Ambulatory Visit (INDEPENDENT_AMBULATORY_CARE_PROVIDER_SITE_OTHER): Payer: PRIVATE HEALTH INSURANCE | Admitting: Nurse Practitioner

## 2021-06-02 VITALS — BP 130/100 | HR 100 | Ht 73.0 in | Wt 278.0 lb

## 2021-06-02 DIAGNOSIS — K2100 Gastro-esophageal reflux disease with esophagitis, without bleeding: Secondary | ICD-10-CM

## 2021-06-02 DIAGNOSIS — J029 Acute pharyngitis, unspecified: Secondary | ICD-10-CM | POA: Diagnosis not present

## 2021-06-02 MED ORDER — PANTOPRAZOLE SODIUM 40 MG PO TBEC: 40.0000 mg | DELAYED_RELEASE_TABLET | Freq: Every day | ORAL | 3 refills | Status: DC

## 2021-06-02 NOTE — Progress Notes (Signed)
Subjective:    Patient ID: Bruce Kemp, male    DOB: 02/10/1986, 35 y.o.   MRN: [id number]  HPI: Bruce Kemp is a 35 y.o. male presenting for sore throat and "feels like there is something in my throat"  No chief complaint on file.  THROAT PROBLEM Onset: early this am Location: throat - middle feels lump in throat Duration: hours Pain: yes with swallowing Severity: mild Characteristics: dry, sharp Timing: constant Aggravating Factors: swallowing, talking Relieving Factors: nothing Treatments attempted: drinking fluids Relief with treatments attempted: no Associated signs & symptoms: no fever, cough, shortness of breath, wheezing, chest pain, nasal congestion, post nasal drip, swollen glands.    Of note, does take etodolac daily and was having some acid reflux yesterday.  He smokes 1 cigarette before bedtime.  He is feeling very anxious and worried about his throat.  No Known Allergies  Outpatient Encounter Medications as of 06/02/2021  Medication Sig   pantoprazole (PROTONIX) 40 MG tablet Take 1 tablet (40 mg total) by mouth daily.   ALPRAZolam (XANAX) 0.5 MG tablet Take 1 tablet (0.5 mg total) by mouth at bedtime as needed for anxiety.   cetirizine (ZYRTEC) 10 MG tablet Take 10 mg by mouth daily. (Patient not taking: No sig reported)   etodolac (LODINE) 500 MG tablet Take 1 tablet (500 mg total) by mouth 2 (two) times daily.   methylphenidate (METADATE CD) 40 MG CR capsule Take 40 mg by mouth daily.   Vilazodone HCl (VIIBRYD) 40 MG TABS Take 40 mg by mouth daily.   No facility-administered encounter medications on file as of 06/02/2021.    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 Interim medical history since our last visit reviewed.  Review of Systems Per HPI unless specifically indicated above     Objective:    BP (!) 130/100    Pulse 100    Ht 6\' 1"  (1.854 m)    Wt 278 lb (126.1 kg)    BMI 36.68 kg/m   Wt Readings from Last 3 Encounters:  06/02/21 278 lb (126.1 kg)  11/02/20 284 lb 6.4 oz (129 kg)  09/03/20 284 lb (128.8 kg)    Physical Exam Vitals and nursing note reviewed.  Constitutional:      General: He is not in acute distress.    Appearance: Normal appearance. He is obese. He is not toxic-appearing.  HENT:     Head:     Salivary Glands: Right salivary gland is not tender. Left salivary gland is not diffusely enlarged or tender.     Right Ear: Tympanic membrane, ear canal and external ear normal.     Left Ear: Tympanic membrane, ear canal and external ear normal.     Nose: Nose normal. No congestion.     Right Sinus: No maxillary sinus tenderness or frontal sinus tenderness.     Left Sinus: No maxillary sinus tenderness or frontal sinus tenderness.     Mouth/Throat:     Lips: Pink.     Mouth: Mucous membranes are moist.  Pharynx: Oropharynx is clear. Uvula midline. Posterior oropharyngeal erythema and uvula swelling present.     Tonsils: No tonsillar exudate or tonsillar abscesses.  Cardiovascular:     Heart sounds: Normal heart sounds. No murmur heard. Pulmonary:     Effort: Pulmonary effort is normal. No respiratory distress.     Breath sounds: Normal breath sounds. No wheezing, rhonchi or rales.  Skin:    General: Skin is warm and dry.     Coloration: Skin is not jaundiced or pale.     Findings: No erythema or rash.  Neurological:     Mental Status: He is alert and oriented to person, place, and time.  Psychiatric:        Attention and Perception: Attention normal.        Mood and Affect: Mood is  anxious.        Speech: Speech normal.        Behavior: Behavior normal. Behavior is cooperative.    Results for orders placed or performed in visit on 06/02/21  STREP GROUP A AG, W/REFLEX TO CULT   Specimen: Throat  Result Value Ref Range   Streptococcus Group A AG NOT DETECTED NOT DETECTED      Assessment & Plan:  1. Sore throat Acute.  Suspect related to swollen uvula.  Check strep culture.   - STREP GROUP A AG, W/REFLEX TO CULT  2. Gastroesophageal reflux disease with esophagitis without hemorrhage Acute.  Suspect nocturnal reflux that irritated uvula.  Start daily PPI, avoid GI irritants and sleep with HOB > 30 degrees.  No red flags in history or on examination today.  Specifically, no stridor, wheezing, or shortness of breath.  Follow up if symptoms do not improve with discussed treatment.  Go to ER with any worsening of symptoms.   - pantoprazole (PROTONIX) 40 MG tablet; Take 1 tablet (40 mg total) by mouth daily.  Dispense: 30 tablet; Refill: 3   Follow up plan: No follow-ups on file.

## 2021-06-03 NOTE — Progress Notes (Signed)
Appointment time moved

## 2021-06-04 LAB — STREP GROUP A AG, W/REFLEX TO CULT: Streptococcus Group A AG: NOT DETECTED

## 2021-06-04 LAB — CULTURE, GROUP A STREP
MICRO NUMBER:: 12785445
SPECIMEN QUALITY:: ADEQUATE

## 2021-06-08 ENCOUNTER — Other Ambulatory Visit: Payer: Self-pay

## 2021-06-08 ENCOUNTER — Ambulatory Visit: Payer: Self-pay | Admitting: *Deleted

## 2021-06-08 VITALS — BP 150/96

## 2021-06-08 DIAGNOSIS — F411 Generalized anxiety disorder: Secondary | ICD-10-CM

## 2021-06-08 DIAGNOSIS — I10 Essential (primary) hypertension: Secondary | ICD-10-CM

## 2021-06-08 NOTE — Progress Notes (Signed)
Pt feeling anxious. Requests BP check. Reports when he is anxious his BP increases, but then it becomes a cycle that the elevated BP worsens his anxiety over his health. Has been more on edge and anxious over past 4-5 days. Ritalin dose was increased last week and he wonders if this can be contributing. Reviewed possible side effects and encouraged him to speak with pcp regarding dosing and notify of anxiety sx worsening. Did not take ritalin today and anxiety persists. Reminded him he can try breakthrough prn anxiety med (Xanax) he has at home. Pt agreeable and is going to leave work a little early today to go take med. Already took tomorrow off to rest.

## 2021-08-03 ENCOUNTER — Other Ambulatory Visit: Payer: Self-pay

## 2021-08-03 ENCOUNTER — Telehealth: Payer: Self-pay | Admitting: Registered Nurse

## 2021-08-03 ENCOUNTER — Encounter: Payer: Self-pay | Admitting: Registered Nurse

## 2021-08-03 VITALS — Resp 18

## 2021-08-03 DIAGNOSIS — J069 Acute upper respiratory infection, unspecified: Secondary | ICD-10-CM

## 2021-08-03 MED ORDER — PREDNISONE 10 MG PO TABS: ORAL_TABLET | ORAL | 0 refills | Status: AC

## 2021-08-03 MED ORDER — AZITHROMYCIN 250 MG PO TABS: ORAL_TABLET | ORAL | 0 refills | Status: DC

## 2021-08-03 NOTE — Progress Notes (Signed)
Subjective:    Patient ID: Bruce Kemp, male    DOB: 05/13/1986, 36 y.o.   MRN: [id number]  35y/o caucasian male established patient with cough/sinus drainage/pain/pressure.  Cough waking him up at night.  Working remote.  Covid test negative at home today.  Felt hot last night.  Had left over prednisone at home; reports lots of mucous brown this am; running vaporizer; unsure if he has albuterol inhaler or any left over cough medication from last illness influenza Nov 2022.  Denied loss of taste/smell, hemoptysis, throwing up after cough.  Hasn't taken methylphenidate since Friday.  Taking etodolac for pain.  Last antibiotics augmentin in 2022.  Video connection was lost when more than 50% of the duration of the visit was complete, at which time the remainder of the visit was completed via audio only.  Patient consented to video visit.  Duration of video visit 12 minutes 43 seconds.  I spent 22 minutes dedicated to the care of this patient on the date of this encounter to include pre-visit review of Epic encounters during the past year, results review, care everywhere, allergies, medical history, surgical history, family medical history, medications; face to face time with the patient, and post visit ordering of medications and writing patient instructions/attaching handouts to my chart account. Patient location at home.  Provider location [facility]       Review of Systems  Constitutional:  Positive for fatigue. Negative for activity change, appetite change, chills, diaphoresis and fever.  HENT:  Positive for congestion, ear pain, postnasal drip, rhinorrhea, sinus pressure, sinus pain and sneezing. Negative for dental problem, drooling, ear discharge, facial swelling, hearing loss, mouth sores, nosebleeds, trouble swallowing and voice change.   Eyes:  Negative for photophobia and visual disturbance.  Respiratory:  Positive for cough. Negative for wheezing and stridor.    Cardiovascular:  Negative for chest pain and palpitations.  Gastrointestinal:  Negative for diarrhea, nausea and vomiting.  Endocrine: Negative for cold intolerance and heat intolerance.  Genitourinary:  Negative for difficulty urinating.  Musculoskeletal:  Negative for back pain, gait problem, myalgias, neck pain and neck stiffness.  Skin:  Negative for color change and rash.  Allergic/Immunologic: Negative for food allergies.  Neurological:  Positive for headaches. Negative for dizziness, tremors, seizures, syncope, facial asymmetry, speech difficulty, weakness, light-headedness and numbness.  Hematological:  Negative for adenopathy. Does not bruise/bleed easily.  Psychiatric/Behavioral:  Positive for sleep disturbance. Negative for agitation and confusion.       Objective:   Physical Exam Vitals and nursing note reviewed.  Constitutional:      General: He is awake. He is not in acute distress.    Appearance: Normal appearance. He is well-developed and well-groomed. He is obese. He is ill-appearing. He is not toxic-appearing or diaphoretic.  HENT:     Head: Normocephalic and atraumatic.     Jaw: There is normal jaw occlusion.     Salivary Glands: Right salivary gland is not diffusely enlarged. Left salivary gland is not diffusely enlarged.     Right Ear: Hearing and external ear normal.     Left Ear: Hearing and external ear normal.     Nose: Congestion and rhinorrhea present.     Right Sinus: Maxillary sinus tenderness and frontal sinus tenderness present.     Left Sinus: Maxillary sinus tenderness and frontal sinus tenderness present.     Mouth/Throat:     Lips: Pink. No lesions.     Mouth: No oral lesions or  angioedema.     Dentition: No gum lesions.     Tongue: No lesions. Tongue does not deviate from midline.     Palate: No mass and lesions.     Pharynx: Uvula midline. Pharyngeal swelling and posterior oropharyngeal erythema present.  Eyes:     General: Lids are normal.  Vision grossly intact. Gaze aligned appropriately. Allergic shiner present.     Extraocular Movements: Extraocular movements intact.     Conjunctiva/sclera: Conjunctivae normal.     Pupils: Pupils are equal, round, and reactive to light.  Neck:     Trachea: Trachea and phonation normal. No tracheal deviation.  Pulmonary:     Effort: Pulmonary effort is normal. No respiratory distress.     Breath sounds: Normal breath sounds. No stridor or transmitted upper airway sounds. No wheezing.     Comments: Spoke full sentences without difficulty; intermittent cough nonproductive, throat clearing and nasal sniffing noted during video visit Abdominal:     Palpations: Abdomen is soft.  Musculoskeletal:        General: Normal range of motion.     Cervical back: Normal range of motion and neck supple. No swelling, edema, deformity, erythema, signs of trauma, lacerations, rigidity or crepitus. No pain with movement. Normal range of motion.  Lymphadenopathy:     Head:     Right side of head: No preauricular adenopathy.     Left side of head: No preauricular adenopathy.     Cervical:     Right cervical: No superficial cervical adenopathy.    Left cervical: No superficial cervical adenopathy.  Skin:    General: Skin is warm and dry.     Coloration: Skin is not ashen, cyanotic, jaundiced, mottled, pale or sallow.     Findings: No abrasion, abscess, acne, bruising, burn, ecchymosis, erythema, signs of injury, laceration, lesion, petechiae, rash or wound.  Neurological:     General: No focal deficit present.     Mental Status: He is alert and oriented to person, place, and time. Mental status is at baseline.     GCS: GCS eye subscore is 4. GCS verbal subscore is 5. GCS motor subscore is 6.     Cranial Nerves: Cranial nerves 2-12 are intact. No cranial nerve deficit, dysarthria or facial asymmetry.     Motor: Motor function is intact. No weakness, tremor or seizure activity.     Coordination: Coordination  is intact. Coordination normal.     Gait: Gait is intact. Gait normal.     Comments: Patient gait observed in his home on video; in/out of chair without difficulty  Psychiatric:        Attention and Perception: Attention and perception normal.        Mood and Affect: Mood and affect normal.        Speech: Speech normal.        Behavior: Behavior normal. Behavior is cooperative.        Thought Content: Thought content normal.        Cognition and Memory: Cognition and memory normal.        Judgment: Judgment normal.     Frequent cough/congestion/nasal sniffing during video visit; gait sure and and steady.     Assessment & Plan:  A-URI with cough and congestion  P-patient asked that I give medication from [facility] to [facility] for delivery to him today.  Nasal saline given 1 bottle from clinic stock.  Used lexicomp drug [facility].  Has not taken methylphenidate since Friday.  Discussed hydration to increase as up to 1 liter fluid loss with nasal congestion/mouth breathing/cough/mucous.  Cannot sleep on high dose prednisone taper so lower dose taper today.  Prednisone 10mg  taper po daily with breakfast 30mg  x 2, 20mg  x 2 and 10mg  x 2 dispensed from [facility] for patient #21 RF0.  Azithromycin 250mg  sig t2 po day1 then t1 po daily days 2-5 #6 RF0 dispensed from [facility].  Patient to contact me at [phone] if new or worsening symptoms.  Repeat covid test in 48 hours if no improvement in symptoms or new symptoms.  Isolate at home.  Wear mask when around others.  Most likely parainfluenza virus/RSV/flu/adenovirus circulating in community.  Allergy season started 3 weeks early with tree bloom pollen counts high.  Ensure taking seasonal allergy medication.  Etodolac prn pain. Restart flonase 1 spray each nostril BID, saline 2 sprays each nostril q2h wa prn congestion.  Denied personal or family history of ENT cancer.  Shower BID especially prior to bed. No evidence of systemic bacterial infection, non  toxic and well hydrated.  I do not see where any further testing or imaging is necessary at this time.   I will suggest supportive care, rest, good hygiene and encourage the patient to take adequate fluids.  The patient is to return to clinic or EMERGENCY ROOM if symptoms worsen or change significantly.  Exitcare handouts printed and given on viral URI, sinusitis and sinus rinse.  Patient verbalized agreement and understanding of treatment plan and had no further questions at this time.   P2:  Hand washing and cover cough   Rx from [facility] azithromycin 250mg  sig t2 po day 1 then t1 po daily days 2-5 #6 RF0  dispensed from [facility]. Cough lozenges po per manufacturer instructions at home.  Prednisone taper 10mg  (30mg x2days; 20mg x2days/10mg  x 2days) po daily with breakfast #21 RF0 dispensed from [facility].  Discussed possible side effects increased/decreased appetite, difficulty sleeping, increased blood sugar, increased blood pressure and heart rate.  Albuterol MDI 72mcg 1-2 puffs po q4-6h prn protracted cough/wheeze at home possible side effect increased heart rate.  Notify clinic staff if wheezing/throwing up after coughing/shortness of breath. Bronchitis simple, community acquired, may have started as viral (probably respiratory syncytial, parainfluenza, influenza, or adenovirus), but now evidence of acute purulent bronchitis with resultant bronchial edema and mucus formation.  Viruses are the most common cause of bronchial inflammation in otherwise healthy adults with acute bronchitis.  The appearance of sputum is not predictive of whether a bacterial infection is present.  Purulent sputum is most often caused by viral infections.  There are a small portion of those caused by non-viral agents being Mycoplama pneumonia.  Microscopic examination or C&S of sputum in the healthy adult with acute bronchitis is generally not helpful (usually negative or normal respiratory flora) other considerations being cough from upper  respiratory tract infections, sinusitis or allergic syndromes (mild asthma or viral pneumonia).  Differential Diagnoses:  reactive airway disease (asthma, allergic aspergillosis (eosinophilia), chronic bronchitis, respiratory infection (sinusitis, common cold, pneumonia), congestive heart failure, reflux esophagitis, bronchogenic tumor, aspiration syndromes and/or exposure to pulmonary irritants/smoke.  Without high fever, severe dyspnea, lack of physical findings or other risk factors, I will hold on a chest radiograph and CBC at this time.  I discussed that approximately 50% of patients with acute bronchitis have a cough that lasts up to three weeks, and 25% for over a month.  Tylenol 500mg  one to two tablets every four to six hours as needed for  fever or myalgias.  No aspirin. Exitcare handouts on bronchitis and acute cough printed and given to patient rep.  ER if hemopthysis, SOB, worst chest pain of life.   Patient instructed to follow up in one week or sooner if symptoms worsen.  Patient verbalized agreement and understanding of treatment plan.  P2:  hand washing and cover cough   Patient may use normal saline nasal spray 2 sprays each nostril q2h wa as needed. flonase 66mcg 1 spray each nostril BID.  Patient denied personal or family history of ENT cancer.  OTC antihistamine of choice claritin/zyrtec 10mg  po daily.  Avoid triggers if possible.  Shower prior to bedtime if exposed to triggers.  If allergic dust/dust mites recommend mattress/pillow covers/encasements; washing linens, vacuuming, sweeping, dusting weekly.  Call or return to clinic as needed if these symptoms worsen or fail to improve as anticipated.   Exitcare handout on allergic rhinitis and sinus rinse given to patient rep.  Patient verbalized understanding of instructions, agreed with plan of care and had no further questions at this time.  P2:  Avoidance and hand washing.

## 2021-08-03 NOTE — Patient Instructions (Signed)
Viral Respiratory Infection A respiratory infection is an illness that affects part of the respiratory system, such as the lungs, nose, or throat. A respiratory infection that is caused by a virus is called a viral respiratory infection. Common types of viral respiratory infections include: A cold. The flu (influenza). A respiratory syncytial virus (RSV) infection. What are the causes? This condition is caused by a virus. The virus may spread through contact with droplets or direct contact with infected people or their mucus or secretions. The virus may spread from person to person (is contagious). What are the signs or symptoms? Symptoms of this condition include: A stuffy or runny nose. A sore throat or cough. Shortness of breath or difficulty breathing. Yellow or green mucus (sputum). Other symptoms may include: A fever. Sweating or chills. Fatigue. Achy muscles. A headache. How is this diagnosed? This condition may be diagnosed based on: Your symptoms. A physical exam. Testing of secretions from the nose or throat. Chest X-ray. How is this treated? This condition may be treated with medicines, such as: Antiviral medicine. This may shorten the length of time a person has symptoms. Expectorants. These make it easier to cough up mucus. Decongestant nasal sprays. Acetaminophen or NSAIDs, such as ibuprofen, to relieve fever and pain. Antibiotic medicines are not prescribed for viral infections.This is because antibiotics are designed to kill bacteria. They do not kill viruses. Follow these instructions at home: Managing pain and congestion Take over-the-counter and prescription medicines only as told by your health care provider. If you have a sore throat, gargle with a mixture of salt and water 3-4 times a day or as needed. To make salt water, completely dissolve -1 tsp (3-6 g) of salt in 1 cup (237 mL) of warm water. Use nose drops made from salt water to ease congestion and  soften raw skin around your nose. Take 2 tsp (10 mL) of honey at bedtime to lessen coughing at night. Do not give honey to children who are younger than 1 year. Drink enough fluid to keep your urine pale yellow. This helps prevent dehydration and helps loosen up mucus. General instructions  Rest as much as possible. Do not drink alcohol. Do not use any products that contain nicotine or tobacco. These products include cigarettes, chewing tobacco, and vaping devices, such as e-cigarettes. If you need help quitting, ask your health care provider. Keep all follow-up visits. This is important. How is this prevented?   Get an annual flu shot. You may get the flu shot in late summer, fall, or winter. Ask your health care provider when you should get your flu shot. Avoid spreading your infection to other people. If you are sick: Wash your hands with soap and water often, especially after you cough or sneeze. Wash for at least 20 seconds. If soap and water are not available, use alcohol-based hand sanitizer. Cover your mouth when you cough. Cover your nose and mouth when you sneeze. Do not share cups or eating utensils. Clean commonly used objects often. Clean commonly touched surfaces. Stay home from work or school as told by your health care provider. Avoid contact with people who are sick during cold and flu season. This is generally fall and winter. Contact a health care provider if: Your symptoms last for 10 days or longer. Your symptoms get worse over time. You have severe sinus pain in your face or forehead. The glands in your jaw or neck become very swollen. You have shortness of breath. Get help right  away if you: Feel pain or pressure in your chest. Have trouble breathing. Faint or feel like you will faint. Have severe and persistent vomiting. Feel confused or disoriented. These symptoms may represent a serious problem that is an emergency. Do not wait to see if the symptoms will go  away. Get medical help right away. Call your local emergency services (911 in the [place] Do not drive yourself to the hospital. Summary A respiratory infection is an illness that affects part of the respiratory system, such as the lungs, nose, or throat. A respiratory infection that is caused by a virus is called a viral respiratory infection. Common types of viral respiratory infections include a cold, influenza, and respiratory syncytial virus (RSV) infection. Symptoms of this condition include a stuffy or runny nose, cough, fatigue, achy muscles, sore throat, and fevers or chills. Antibiotic medicines are not prescribed for viral infections. This is because antibiotics are designed to kill bacteria. They are not effective against viruses. This information is not intended to replace advice given to you by your health care provider. Make sure you discuss any questions you have with your health care provider. Document Revised: 09/03/2020 Document Reviewed: 09/03/2020 Elsevier Patient Education  [place]. Cough, Adult Coughing is a reflex that clears your throat and your airways (respiratory system). Coughing helps to heal and protect your lungs. It is normal to cough occasionally, but a cough that happens with other symptoms or lasts a long time may be a sign of a condition that needs treatment. An acute cough may only last 2-3 weeks, while a chronic cough may last 8 or more weeks. Coughing is commonly caused by: Infection of the respiratory systemby viruses or bacteria. Breathing in substances that irritate your lungs. Allergies. Asthma. Mucus that runs down the back of your throat (postnasal drip). Smoking. Acid backing up from the stomach into the esophagus (gastroesophageal reflux). Certain medicines. Chronic lung problems. Other medical conditions such as heart failure or a blood clot in the lung (pulmonary embolism). Follow these instructions at home: Medicines Take  over-the-counter and prescription medicines only as told by your health care provider. Talk with your health care provider before you take a cough suppressant medicine. Lifestyle  Avoid cigarette smoke. Do not use any products that contain nicotine or tobacco, such as cigarettes, e-cigarettes, and chewing tobacco. If you need help quitting, ask your health care provider. Drink enough fluid to keep your urine pale yellow. Avoid caffeine. Do not drink alcohol if your health care provider tells you not to drink. General instructions  Pay close attention to changes in your cough. Tell your health care provider about them. Always cover your mouth when you cough. Avoid things that make you cough, such as perfume, candles, cleaning products, or campfire or tobacco smoke. If the air is dry, use a cool mist vaporizer or humidifier in your bedroom or your home to help loosen secretions. If your cough is worse at night, try to sleep in a semi-upright position. Rest as needed. Keep all follow-up visits as told by your health care provider. This is important. Contact a health care provider if you: Have new symptoms. Cough up pus. Have a cough that does not get better after 2-3 weeks or gets worse. Cannot control your cough with cough suppressant medicines and you are losing sleep. Have pain that gets worse or pain that is not helped with medicine. Have a fever. Have unexplained weight loss. Have night sweats. Get help right away if: You  cough up blood. You have difficulty breathing. Your heartbeat is very fast. These symptoms may represent a serious problem that is an emergency. Do not wait to see if the symptoms will go away. Get medical help right away. Call your local emergency services (911 in the [place] Do not drive yourself to the hospital. Summary Coughing is a reflex that clears your throat and your airways. It is normal to cough occasionally, but a cough that happens with other symptoms  or lasts a long time may be a sign of a condition that needs treatment. Take over-the-counter and prescription medicines only as told by your health care provider. Always cover your mouth when you cough. Contact a health care provider if you have new symptoms or a cough that does not get better after 2-3 weeks or gets worse. This information is not intended to replace advice given to you by your health care provider. Make sure you discuss any questions you have with your health care provider. Document Revised: 06/18/2018 Document Reviewed: 06/18/2018 Elsevier Patient Education  2022 [place]. How to Perform a Sinus Rinse A sinus rinse is a home treatment that is used to rinse your sinuses with a germ-free (sterile) mixture of salt and water (saline solution). Sinuses are air-filled spaces in your skull that are behind the bones of your face and forehead. They open into your nasal cavity. A sinus rinse can help to clear mucus, dirt, dust, or pollen from your nasal cavity. You may do a sinus rinse when you have a cold, a virus, nasal allergy symptoms, a sinus infection, or stuffiness in your nose or sinuses. What are the risks? A sinus rinse is generally safe and effective. However, there are a few risks, which include: A burning sensation in your sinuses. This may happen if you do not make the saline solution as directed. Be sure to follow all directions when making the saline solution. Nasal irritation. Infection. This may be from unclean supplies or from contaminated water. Infection from contaminated water is rare, but possible. Do not do a sinus rinse if you have had ear or nasal surgery, ear infection, or plugged ears, unless recommended by your health care provider. Supplies needed: Saline solution or powder. Distilled or sterile water to mix with saline powder. You may use boiled and cooled tap water. Boil tap water for 5 minutes; cool until it is lukewarm. Use within 24 hours. Do not  use regular tap water to mix with the saline solution. Neti pot or nasal rinse bottle. These supplies release the saline solution into your nose and through your sinuses. Neti pots and nasal rinse bottles can be purchased at [employer], a health food store, or online. How to perform a sinus rinse  Wash your hands with soap and water for at least 20 seconds. If soap and water are not available, use hand sanitizer. Wash your device according to the directions that came with the product and then dry it. Use the solution that comes with your product or one that is sold separately in stores. Follow the mixing directions on the package to mix with sterile or distilled water. Fill the device with the amount of saline solution noted in the device instructions. Stand by a sink and tilt your head sideways over the sink. Place the spout of the device in your upper nostril (the one closer to the ceiling). Gently pour or squeeze the saline solution into your nasal cavity. The liquid should drain out from the  lower nostril if you are not too congested. While rinsing, breathe through your open mouth. Gently blow your nose to clear any mucus and rinse solution. Blowing too hard may cause ear pain. Turn your head in the other direction and repeat in your other nostril. Clean and rinse your device with clean water and then air-dry it. Talk with your health care provider or pharmacist if you have questions about how to do a sinus rinse. Summary A sinus rinse is a home treatment that is used to rinse your sinuses with a sterile mixture of salt and water (saline solution). You may do a sinus rinse when you have a cold, a virus, nasal allergy symptoms, a sinus infection, or stuffiness in your nose or sinuses. A sinus rinse is generally safe and effective. Follow all instructions carefully. This information is not intended to replace advice given to you by your health care provider. Make sure you discuss any  questions you have with your health care provider. Document Revised: 11/16/2020 Document Reviewed: 11/16/2020 Elsevier Patient Education  [place]. Sinusitis, Adult Sinusitis is inflammation of your sinuses. Sinuses are hollow spaces in the bones around your face. Your sinuses are located: Around your eyes. In the middle of your forehead. Behind your nose. In your cheekbones. Mucus normally drains out of your sinuses. When your nasal tissues become inflamed or swollen, mucus can become trapped or blocked. This allows bacteria, viruses, and fungi to grow, which leads to infection. Most infections of the sinuses are caused by a virus. Sinusitis can develop quickly. It can last for up to 4 weeks (acute) or for more than 12 weeks (chronic). Sinusitis often develops after a cold. What are the causes? This condition is caused by anything that creates swelling in the sinuses or stops mucus from draining. This includes: Allergies. Asthma. Infection from bacteria or viruses. Deformities or blockages in your nose or sinuses. Abnormal growths in the nose (nasal polyps). Pollutants, such as chemicals or irritants in the air. Infection from fungi (rare). What increases the risk? You are more likely to develop this condition if you: Have a weak body defense system (immune system). Do a lot of swimming or diving. Overuse nasal sprays. Smoke. What are the signs or symptoms? The main symptoms of this condition are pain and a feeling of pressure around the affected sinuses. Other symptoms include: Stuffy nose or congestion. Thick drainage from your nose. Swelling and warmth over the affected sinuses. Headache. Upper toothache. A cough that may get worse at night. Extra mucus that collects in the throat or the back of the nose (postnasal drip). Decreased sense of smell and taste. Fatigue. A fever. Sore throat. Bad breath. How is this diagnosed? This condition is diagnosed based  on: Your symptoms. Your medical history. A physical exam. Tests to find out if your condition is acute or chronic. This may include: Checking your nose for nasal polyps. Viewing your sinuses using a device that has a light (endoscope). Testing for allergies or bacteria. Imaging tests, such as an MRI or CT scan. In rare cases, a bone biopsy may be done to rule out more serious types of fungal sinus disease. How is this treated? Treatment for sinusitis depends on the cause and whether your condition is chronic or acute. If caused by a virus, your symptoms should go away on their own within 10 days. You may be given medicines to relieve symptoms. They include: Medicines that shrink swollen nasal passages (topical intranasal decongestants). Medicines that  treat allergies (antihistamines). A spray that eases inflammation of the nostrils (topical intranasal corticosteroids). Rinses that help get rid of thick mucus in your nose (nasal saline washes). If caused by bacteria, your health care provider may recommend waiting to see if your symptoms improve. Most bacterial infections will get better without antibiotic medicine. You may be given antibiotics if you have: A severe infection. A weak immune system. If caused by narrow nasal passages or nasal polyps, you may need to have surgery. Follow these instructions at home: Medicines Take, use, or apply over-the-counter and prescription medicines only as told by your health care provider. These may include nasal sprays. If you were prescribed an antibiotic medicine, take it as told by your health care provider. Do not stop taking the antibiotic even if you start to feel better. Hydrate and humidify  Drink enough fluid to keep your urine pale yellow. Staying hydrated will help to thin your mucus. Use a cool mist humidifier to keep the humidity level in your home above 50%. Inhale steam for 10-15 minutes, 3-4 times a day, or as told by your health  care provider. You can do this in the bathroom while a hot shower is running. Limit your exposure to cool or dry air. Rest Rest as much as possible. Sleep with your head raised (elevated). Make sure you get enough sleep each night. General instructions  Apply a warm, moist washcloth to your face 3-4 times a day or as told by your health care provider. This will help with discomfort. Wash your hands often with soap and water to reduce your exposure to germs. If soap and water are not available, use hand sanitizer. Do not smoke. Avoid being around people who are smoking (secondhand smoke). Keep all follow-up visits as told by your health care provider. This is important. Contact a health care provider if: You have a fever. Your symptoms get worse. Your symptoms do not improve within 10 days. Get help right away if: You have a severe headache. You have persistent vomiting. You have severe pain or swelling around your face or eyes. You have vision problems. You develop confusion. Your neck is stiff. You have trouble breathing. Summary Sinusitis is soreness and inflammation of your sinuses. Sinuses are hollow spaces in the bones around your face. This condition is caused by nasal tissues that become inflamed or swollen. The swelling traps or blocks the flow of mucus. This allows bacteria, viruses, and fungi to grow, which leads to infection. If you were prescribed an antibiotic medicine, take it as told by your health care provider. Do not stop taking the antibiotic even if you start to feel better. Keep all follow-up visits as told by your health care provider. This is important. This information is not intended to replace advice given to you by your health care provider. Make sure you discuss any questions you have with your health care provider. Document Revised: 10/30/2017 Document Reviewed: 10/30/2017 Elsevier Patient Education  2022 [place]. Acute Bronchitis, Adult Acute  bronchitis is sudden inflammation of the main airways (bronchi) that come off the windpipe (trachea) in the lungs. The swelling causes the airways to get smaller and make more mucus than normal. This can make it hard to breathe and can cause coughing or noisy breathing (wheezing). Acute bronchitis may last several weeks. The cough may last longer. Allergies, asthma, and exposure to smoke may make the condition worse. What are the causes? This condition can be caused by germs and by substances  that irritate the lungs, including: Cold and flu viruses. The most common cause of this condition is the virus that causes the common cold. Bacteria. This is less common. Breathing in substances that irritate the lungs, including: Smoke from cigarettes and other forms of tobacco. Dust and pollen. Fumes from household cleaning products, gases, or burned fuel. Indoor or outdoor air pollution. What increases the risk? The following factors may make you more likely to develop this condition: A weak body's defense system, also called the immune system. A condition that affects your lungs and breathing, such as asthma. What are the signs or symptoms? Common symptoms of this condition include: Coughing. This may bring up clear, yellow, or green mucus from your lungs (sputum). Wheezing. Runny or stuffy nose. Having too much mucus in your lungs (chest congestion). Shortness of breath. Aches and pains, including sore throat or chest. How is this diagnosed? This condition is usually diagnosed based on: Your symptoms and medical history. A physical exam. You may also have other tests, including tests to rule out other conditions, such as pneumonia. These tests include: A test of lung function. Test of a mucus sample to look for the presence of bacteria. Tests to check the oxygen level in your blood. Blood tests. Chest X-ray. How is this treated? Most cases of acute bronchitis clear up over time without  treatment. Your health care provider may recommend: Drinking more fluids to help thin your mucus so it is easier to cough up. Taking inhaled medicine (inhaler) to improve air flow in and out of your lungs. Using a vaporizer or a humidifier. These are machines that add water to the air to help you breathe better. Taking a medicine that thins mucus and clears congestion (expectorant). Taking a medicine that prevents or stops coughing (cough suppressant). It is notcommon to take an antibiotic medicine for this condition. Follow these instructions at home:  Take over-the-counter and prescription medicines only as told by your health care provider. Use an inhaler, vaporizer, or humidifier as told by your health care provider. Take two teaspoons (10 mL) of honey at bedtime to lessen coughing at night. Drink enough fluid to keep your urine pale yellow. Do not use any products that contain nicotine or tobacco. These products include cigarettes, chewing tobacco, and vaping devices, such as e-cigarettes. If you need help quitting, ask your health care provider. Get plenty of rest. Return to your normal activities as told by your health care provider. Ask your health care provider what activities are safe for you. Keep all follow-up visits. This is important. How is this prevented? To lower your risk of getting this condition again: Wash your hands often with soap and water for at least 20 seconds. If soap and water are not available, use hand sanitizer. Avoid contact with people who have cold symptoms. Try not to touch your mouth, nose, or eyes with your hands. Avoid breathing in smoke or chemical fumes. Breathing smoke or chemical fumes will make your condition worse. Get the flu shot every year. Contact a health care provider if: Your symptoms do not improve after 2 weeks. You have trouble coughing up the mucus. Your cough keeps you awake at night. You have a fever. Get help right away if  you: Cough up blood. Feel pain in your chest. Have severe shortness of breath. Faint or keep feeling like you are going to faint. Have a severe headache. Have a fever or chills that get worse. These symptoms may represent a serious  problem that is an emergency. Do not wait to see if the symptoms will go away. Get medical help right away. Call your local emergency services (911 in the [place] Do not drive yourself to the hospital. Summary Acute bronchitis is inflammation of the main airways (bronchi) that come off the windpipe (trachea) in the lungs. The swelling causes the airways to get smaller and make more mucus than normal. Drinking more fluids can help thin your mucus so it is easier to cough up. Take over-the-counter and prescription medicines only as told by your health care provider. Do not use any products that contain nicotine or tobacco. These products include cigarettes, chewing tobacco, and vaping devices, such as e-cigarettes. If you need help quitting, ask your health care provider. Contact a health care provider if your symptoms do not improve after 2 weeks. This information is not intended to replace advice given to you by your health care provider. Make sure you discuss any questions you have with your health care provider. Document Revised: 09/30/2020 Document Reviewed: 09/30/2020 Elsevier Patient Education  [place].

## 2021-08-28 ENCOUNTER — Other Ambulatory Visit: Payer: Self-pay | Admitting: Nurse Practitioner

## 2021-08-28 DIAGNOSIS — K2100 Gastro-esophageal reflux disease with esophagitis, without bleeding: Secondary | ICD-10-CM

## 2021-10-12 ENCOUNTER — Encounter: Payer: Self-pay | Admitting: Registered Nurse

## 2021-10-12 ENCOUNTER — Ambulatory Visit: Payer: Self-pay | Admitting: Registered Nurse

## 2021-10-12 VITALS — BP 131/97 | HR 108

## 2021-10-12 DIAGNOSIS — R030 Elevated blood-pressure reading, without diagnosis of hypertension: Secondary | ICD-10-CM

## 2021-10-12 DIAGNOSIS — T50905A Adverse effect of unspecified drugs, medicaments and biological substances, initial encounter: Secondary | ICD-10-CM

## 2021-10-12 DIAGNOSIS — R000 Tachycardia, unspecified: Secondary | ICD-10-CM

## 2021-10-12 NOTE — Progress Notes (Signed)
? ?Subjective:  ? ? Patient ID: Bruce Kemp, male    DOB: 01-Jun-1986, 36 y.o.   MRN: [id number] ? ?36y/o caucasian single male established patient here for evaluation of elevated heart rate.  Started wearing new garmin fitness tracker this past week.  Noticed heart rate has been above 100 commonly.  Is still taking his methylphenidate.  Denied caffeine this am.  Stated has been drinking water 1 bottle finished this am.  Just had walk during his lunch break.  Denied recent or current illness.  Worried about elevated heart rate/anxious what it means.  Just finished his college degree and started exercise program to help lose weight he gained while earning his degree  Denied headache/shortness of breath/chest pain/fever/chills/visual changes.  Stated urine clear pale yellow in toilet.  Urinating normal amounts/frequency.  Stated pulse has been regular denied skipped beats. ? ? ? ? ?Review of Systems  ?Constitutional:  Positive for activity change. Negative for appetite change, chills, diaphoresis, fatigue and fever.  ?HENT:  Negative for congestion, postnasal drip, rhinorrhea, sinus pressure, sinus pain, trouble swallowing and voice change.   ?Eyes:  Negative for photophobia and visual disturbance.  ?Respiratory:  Negative for cough, shortness of breath, wheezing and stridor.   ?Cardiovascular:  Negative for chest pain and palpitations.  ?Gastrointestinal:  Negative for diarrhea, nausea and vomiting.  ?Endocrine: Negative for cold intolerance and heat intolerance.  ?Genitourinary:  Negative for decreased urine volume, difficulty urinating, dysuria, enuresis, flank pain, frequency and hematuria.  ?Musculoskeletal:  Negative for arthralgias, back pain, gait problem, joint swelling, myalgias, neck pain and neck stiffness.  ?Skin:  Negative for color change, rash and wound.  ?Allergic/Immunologic: Negative for food allergies.  ?Neurological:  Negative for dizziness, tremors, seizures, syncope, facial asymmetry, speech  difficulty, weakness, light-headedness, numbness and headaches.  ?Hematological:  Negative for adenopathy. Does not bruise/bleed easily.  ?Psychiatric/Behavioral:  Negative for agitation, confusion and sleep disturbance. The patient is nervous/anxious.   ? ?   ?Objective:  ? Physical Exam ?Vitals and nursing note reviewed.  ?Constitutional:   ?   General: He is awake. He is not in acute distress. ?   Appearance: Normal appearance. He is well-developed and well-groomed. He is obese. He is not ill-appearing, toxic-appearing or diaphoretic.  ?HENT:  ?   Head: Normocephalic and atraumatic.  ?   Jaw: There is normal jaw occlusion.  ?   Salivary Glands: Right salivary gland is not diffusely enlarged. Left salivary gland is not diffusely enlarged.  ?   Right Ear: Hearing and external ear normal.  ?   Left Ear: Hearing and external ear normal.  ?   Nose: Nose normal. No congestion or rhinorrhea.  ?   Mouth/Throat:  ?   Lips: Pink. No lesions.  ?   Mouth: Mucous membranes are moist. No oral lesions or angioedema.  ?   Pharynx: Oropharynx is clear. Uvula midline.  ?Eyes:  ?   General: Lids are normal. Vision grossly intact. Gaze aligned appropriately. Allergic shiner present. No scleral icterus.    ?   Right eye: No discharge.     ?   Left eye: No discharge.  ?   Extraocular Movements: Extraocular movements intact.  ?   Conjunctiva/sclera: Conjunctivae normal.  ?   Pupils: Pupils are equal, round, and reactive to light.  ?Neck:  ?   Trachea: Trachea and phonation normal. No tracheal deviation.  ?Cardiovascular:  ?   Rate and Rhythm: Regular rhythm. Tachycardia present.  ?  Pulses: Normal pulses.     ?     Radial pulses are 2+ on the right side and 2+ on the left side.  ?   Comments: Garmin watch compared to sp02 heart rate monitor in clinic and rate within 2-4 beats during 5 minute monitoring ?Pulmonary:  ?   Effort: Pulmonary effort is normal. No respiratory distress.  ?   Breath sounds: Normal breath sounds and air entry.  No stridor or transmitted upper airway sounds. No wheezing.  ?   Comments: Spoke full sentences without difficulty; no cough observed in exam room ?Abdominal:  ?   General: Abdomen is flat.  ?Musculoskeletal:     ?   General: Normal range of motion.  ?   Cervical back: Normal range of motion and neck supple. No swelling, edema, deformity, erythema, signs of trauma, lacerations, rigidity or crepitus. No pain with movement. Normal range of motion.  ?   Thoracic back: No swelling, edema, deformity, signs of trauma or lacerations.  ?   Right lower leg: No edema.  ?   Left lower leg: No edema.  ?Lymphadenopathy:  ?   Head:  ?   Right side of head: No submandibular or preauricular adenopathy.  ?   Left side of head: No submandibular or preauricular adenopathy.  ?   Cervical: No cervical adenopathy.  ?   Right cervical: No superficial cervical adenopathy. ?   Left cervical: No superficial cervical adenopathy.  ?Skin: ?   General: Skin is warm and dry.  ?   Capillary Refill: Capillary refill takes less than 2 seconds.  ?   Coloration: Skin is not ashen, cyanotic, jaundiced, mottled, pale or sallow.  ?   Findings: No abrasion, abscess, acne, bruising, burn, ecchymosis, erythema, signs of injury, laceration, lesion, petechiae, rash or wound.  ?   Nails: There is no clubbing.  ?Neurological:  ?   General: No focal deficit present.  ?   Mental Status: He is alert and oriented to person, place, and time. Mental status is at baseline.  ?   GCS: GCS eye subscore is 4. GCS verbal subscore is 5. GCS motor subscore is 6.  ?   Cranial Nerves: Cranial nerves 2-12 are intact. No cranial nerve deficit, dysarthria or facial asymmetry.  ?   Sensory: Sensation is intact. No sensory deficit.  ?   Motor: Motor function is intact. No weakness, tremor, atrophy, abnormal muscle tone or seizure activity.  ?   Coordination: Coordination is intact. Coordination normal.  ?   Gait: Gait is intact. Gait normal.  ?   Comments: In/out of chair without  difficulty; gait sure and steady in clinic; bilateral hand grasp equal 5/5  ?Psychiatric:     ?   Attention and Perception: Attention and perception normal.     ?   Mood and Affect: Affect normal. Mood is anxious.     ?   Speech: Speech normal.     ?   Behavior: Behavior normal. Behavior is cooperative.     ?   Thought Content: Thought content normal.     ?   Cognition and Memory: Cognition and memory normal.     ?   Judgment: Judgment normal.  ? ? ? ? ? ?   ?Assessment & Plan:  ? ?A-tachycardia HR 100-120, medication side effect initial encounter, elevated blood pressure reading without diagnosis of hypertension in office ? ?P-Discussed with patient stimulant will raise heart rate and blood pressure.   He  has not monitored his pulse in the past as did not own wearable fitness tracker.   Exercise, dehydration, pain, anxiety/worrying/stress and illness will raise heart rate and blood pressure.  Bring copy of his Garmin HR findings to next PCM appt may need adjustment of methylphenidate dosing.  Discussed avoid caffeine this afternoon patient stated if he takes methylphenidate he avoids caffeine that day already.    If urine gold in toilet increase water intake.  See RN Hildred Alamin for blood pressure check on different days at different times.  Hydrate to keep urine pale yellow clear and voiding every 2-4 hours while awake.  Notify staff if new pain, fever, chills, illness e.g. cold/diarrhea/vomiting.  Seeing heart rate elevated is increasing his anxiety.  Consider changing watch face to only clock and not include heart rate on main screen.  Patient verbalized understanding information/instructions, agreed with plan of care and had no further questions at this time. ? ?Will decrease caffeine intake.  Could be methylphenidate but also overweight will continue to monitor with serial BP checks with RN Hildred Alamin on different days/times.  Patient had been seated greater than 5 minutes for this reading normal size cuff left arm.  ER  if chest pain, worst headache of life, dyspnea or visual changes for re-evaluation.  [facility]care handouts calorie counting for weight loss, managing anxiety, dash diet and preventing hypertension.  Patient ve

## 2021-10-12 NOTE — Patient Instructions (Addendum)
Sinus Tachycardia ? ?Sinus tachycardia is a kind of fast heartbeat. In sinus tachycardia, the heart beats more than 100 times a minute. Sinus tachycardia starts in a part of the heart called the sinus node. Sinus tachycardia may be harmless, or it may be a sign of a serious condition. ?What are the causes? ?This condition may be caused by: ?Exercise or exertion. ?A fever. ?Pain. ?Loss of body fluids (dehydration). ?Severe bleeding (hemorrhage). ?Anxiety and stress. ?Certain substances, including: ?Alcohol. ?Caffeine. ?Tobacco and nicotine products. ?Cold medicines. ?Illegal drugs. ?Medical conditions including: ?Heart disease. ?An infection. ?An overactive thyroid (hyperthyroidism). ?A lack of red blood cells (anemia). ?What are the signs or symptoms? ?Symptoms of this condition include: ?A feeling that the heart is beating quickly (palpitations). ?Suddenly noticing your heartbeat (cardiac awareness). ?Dizziness. ?Tiredness (fatigue). ?Shortness of breath. ?Chest pain. ?Nausea. ?Fainting. ?How is this diagnosed? ?This condition is diagnosed with: ?A physical exam. ?Other tests, such as: ?Blood tests. ?An electrocardiogram (ECG). This test measures the electrical activity of the heart. ?Ambulatory cardiac monitor. This records your heartbeats for 24 hours or more. ?You may be referred to a heart specialist (cardiologist). ?How is this treated? ?Treatment for this condition depends on the cause or the underlying condition. Treatment may involve: ?Treating the underlying condition. ?Taking new medicines or changing your current medicines as told by your health care provider. ?Making changes to your diet or lifestyle. ?Follow these instructions at home: ?Lifestyle ? ?Do not use any products that contain nicotine or tobacco, such as cigarettes and e-cigarettes. If you need help quitting, ask your health care provider. ?Do not use illegal drugs, such as cocaine. ?Learn relaxation methods to help you when you get stressed  or anxious. These include deep breathing. ?Avoid caffeine or other stimulants. ?Alcohol use ? ?Do not drink alcohol if: ?Your health care provider tells you not to drink. ?You are pregnant, may be pregnant, or are planning to become pregnant. ?If you drink alcohol, limit how much you have: ?0-1 drink a day for women. ?0-2 drinks a day for men. ?Be aware of how much alcohol is in your drink. In the [place] one drink equals one typical bottle of beer (12 oz), one-half glass of wine (5 oz), or one shot of hard liquor (1? oz). ?General instructions ?Drink enough fluids to keep your urine pale yellow. ?Take over-the-counter and prescription medicines only as told by your health care provider. ?Keep all follow-up visits as told by your health care provider. This is important. ?Contact a health care provider if you have: ?A fever. ?Vomiting or diarrhea that does not go away. ?Get help right away if you: ?Have pain in your chest, upper arms, jaw, or neck. ?Become weak or dizzy. ?Feel faint. ?Have palpitations that do not go away. ?Summary ?In sinus tachycardia, the heart beats more than 100 times a minute. ?Sinus tachycardia may be harmless, or it may be a sign of a serious condition. ?Treatment for this condition depends on the cause or the underlying condition. ?Get help right away if you have pain in your chest, upper arms, jaw, or neck. ?This information is not intended to replace advice given to you by your health care provider. Make sure you discuss any questions you have with your health care provider. ?Document Revised: 10/08/2020 Document Reviewed: 10/08/2020 ?Elsevier Patient Education ? [employer]. ?Managing Anxiety, Adult ?After being diagnosed with anxiety, you may be relieved to know why you have felt or behaved a certain way. You may also feel  overwhelmed about the treatment ahead and what it will mean for your life. With care and support, you can manage this condition. ?How to manage lifestyle  changes ?Managing stress and anxiety ? ?Stress is your body's reaction to life changes and events, both good and bad. Most stress will last just a few hours, but stress can be ongoing and can lead to more than just stress. Although stress can play a major role in anxiety, it is not the same as anxiety. Stress is usually caused by something external, such as a deadline, test, or competition. Stress normally passes after the triggering event has ended.  ?Anxiety is caused by something internal, such as imagining a terrible outcome or worrying that something will go wrong that will devastate you. Anxiety often does not go away even after the triggering event is over, and it can become long-term (chronic) worry. It is important to understand the differences between stress and anxiety and to manage your stress effectively so that it does not lead to an anxious response. ?Talk with your health care provider or a counselor to learn more about reducing anxiety and stress. He or she may suggest tension reduction techniques, such as: ?Music therapy. Spend time creating or listening to music that you enjoy and that inspires you. ?Mindfulness-based meditation. Practice being aware of your normal breaths while not trying to control your breathing. It can be done while sitting or walking. ?Centering prayer. This involves focusing on a word, phrase, or sacred image that means something to you and brings you peace. ?Deep breathing. To do this, expand your stomach and inhale slowly through your nose. Hold your breath for 3-5 seconds. Then exhale slowly, letting your stomach muscles relax. ?Self-talk. Learn to notice and identify thought patterns that lead to anxiety reactions and change those patterns to thoughts that feel peaceful. ?Muscle relaxation. Taking time to tense muscles and then relax them. ?Choose a tension reduction technique that fits your lifestyle and personality. These techniques take time and practice. Set aside  5-15 minutes a day to do them. Therapists can offer counseling and training in these techniques. The training to help with anxiety may be covered by some insurance plans. ?Other things you can do to manage stress and anxiety include: ?Keeping a stress diary. This can help you learn what triggers your reaction and then learn ways to manage your response. ?Thinking about how you react to certain situations. You may not be able to control everything, but you can control your response. ?Making time for activities that help you relax and not feeling guilty about spending your time in this way. ?Doing visual imagery. This involves imagining or creating mental pictures to help you relax. ?Practicing yoga. Through yoga poses, you can lower tension and promote relaxation. ? ?Medicines ?Medicines can help ease symptoms. Medicines for anxiety include: ?Antidepressant medicines. These are usually prescribed for long-term daily control. ?Anti-anxiety medicines. These may be added in severe cases, especially when panic attacks occur. ?Medicines will be prescribed by a health care provider. When used together, medicines, psychotherapy, and tension reduction techniques may be the most effective treatment. ?Relationships ?Relationships can play a big part in helping you recover. Try to spend more time connecting with trusted friends and family members. ?Consider going to couples counseling if you have a partner, taking family education classes, or going to family therapy. ?Therapy can help you and others better understand your condition. ?How to recognize changes in your anxiety ?Everyone responds differently to treatment for anxiety.  Recovery from anxiety happens when symptoms decrease and stop interfering with your daily activities at home or work. This may mean that you will start to: ?Have better concentration and focus. Worry will interfere less in your daily thinking. ?Sleep better. ?Be less irritable. ?Have more energy. ?Have  improved memory. ?It is also important to recognize when your condition is getting worse. Contact your health care provider if your symptoms interfere with home or work and you feel like your condition is not i

## 2021-10-26 ENCOUNTER — Other Ambulatory Visit: Payer: Self-pay | Admitting: Registered Nurse

## 2021-10-26 ENCOUNTER — Ambulatory Visit: Payer: Self-pay | Admitting: *Deleted

## 2021-10-26 VITALS — BP 132/94 | Ht 73.5 in | Wt 280.0 lb

## 2021-10-26 DIAGNOSIS — E785 Hyperlipidemia, unspecified: Secondary | ICD-10-CM

## 2021-10-26 DIAGNOSIS — R7401 Elevation of levels of liver transaminase levels: Secondary | ICD-10-CM

## 2021-10-26 DIAGNOSIS — R7989 Other specified abnormal findings of blood chemistry: Secondary | ICD-10-CM

## 2021-10-27 LAB — CMP12+LP+TP+TSH+6AC+CBC/D/PLT
ALT: 61 IU/L — ABNORMAL HIGH (ref 0–32)
AST: 44 IU/L — ABNORMAL HIGH (ref 0–40)
Albumin/Globulin Ratio: 1.6 (ref 1.2–2.2)
Albumin: 4.6 g/dL (ref 3.8–4.8)
Alkaline Phosphatase: 60 IU/L (ref 44–121)
BUN/Creatinine Ratio: 11 (ref 9–23)
BUN: 11 mg/dL (ref 6–20)
Basophils Absolute: 0 10*3/uL (ref 0.0–0.2)
Basos: 1 %
Bilirubin Total: 1 mg/dL (ref 0.0–1.2)
Calcium: 9.7 mg/dL (ref 8.7–10.2)
Chloride: 99 mmol/L (ref 96–106)
Chol/HDL Ratio: 5.5 ratio — ABNORMAL HIGH (ref 0.0–4.4)
Cholesterol, Total: 248 mg/dL — ABNORMAL HIGH (ref 100–199)
Creatinine, Ser: 1.04 mg/dL — ABNORMAL HIGH (ref 0.57–1.00)
EOS (ABSOLUTE): 0.3 10*3/uL (ref 0.0–0.4)
Eos: 5 %
Estimated CHD Risk: 1.5 times avg. — ABNORMAL HIGH (ref 0.0–1.0)
Free Thyroxine Index: 1.7 (ref 1.2–4.9)
GGT: 38 IU/L (ref 0–60)
Globulin, Total: 2.9 g/dL (ref 1.5–4.5)
Glucose: 90 mg/dL (ref 70–99)
HDL: 45 mg/dL (ref >39)
Hematocrit: 48.4 % — ABNORMAL HIGH (ref 34.0–46.6)
Hemoglobin: 16.6 g/dL — ABNORMAL HIGH (ref 11.1–15.9)
Immature Grans (Abs): 0 10*3/uL (ref 0.0–0.1)
Immature Granulocytes: 0 %
Iron: 113 ug/dL (ref 27–159)
LDH: 219 IU/L (ref 119–226)
LDL Chol Calc (NIH): 170 mg/dL — ABNORMAL HIGH (ref 0–99)
Lymphocytes Absolute: 2.3 10*3/uL (ref 0.7–3.1)
Lymphs: 37 %
MCH: 31.6 pg (ref 26.6–33.0)
MCHC: 34.3 g/dL (ref 31.5–35.7)
MCV: 92 fL (ref 79–97)
Monocytes Absolute: 0.5 10*3/uL (ref 0.1–0.9)
Monocytes: 8 %
Neutrophils Absolute: 3.1 10*3/uL (ref 1.4–7.0)
Neutrophils: 49 %
Phosphorus: 3.1 mg/dL (ref 3.0–4.3)
Platelets: 298 10*3/uL (ref 150–450)
Potassium: 4.6 mmol/L (ref 3.5–5.2)
RBC: 5.25 x10E6/uL (ref 3.77–5.28)
RDW: 12.6 % (ref 11.7–15.4)
Sodium: 139 mmol/L (ref 134–144)
T3 Uptake Ratio: 28 % (ref 24–39)
T4, Total: 6.1 ug/dL (ref 4.5–12.0)
TSH: 2.65 u[IU]/mL (ref 0.450–4.500)
Total Protein: 7.5 g/dL (ref 6.0–8.5)
Triglycerides: 176 mg/dL — ABNORMAL HIGH (ref 0–149)
Uric Acid: 4.8 mg/dL (ref 2.6–6.2)
VLDL Cholesterol Cal: 33 mg/dL (ref 5–40)
WBC: 6.3 10*3/uL (ref 3.4–10.8)
eGFR: 72 mL/min/{1.73_m2} (ref >59)

## 2021-10-27 LAB — HGB A1C W/O EAG: Hgb A1c MFr Bld: 5.5 % (ref 4.8–5.6)

## 2021-10-29 ENCOUNTER — Encounter: Payer: Self-pay | Admitting: Registered Nurse

## 2021-10-29 NOTE — Addendum Note (Signed)
Addended by: Gerarda Fraction A on: 10/29/2021 04:40 PM   Modules accepted: Orders, Level of Service

## 2021-11-02 NOTE — Progress Notes (Signed)
[employer] insurance premium discount evaluation: Labs Drawn by [facility] onsite. Replacements ROI form signed. Tobacco Free Attestation form signed.  Forms placed in paper chart.

## 2021-11-04 NOTE — Progress Notes (Signed)
Results reviewed with pt in clinic per NP notes. Pt reports diet and exercise are feasible and would like to try this before any medications.  Endorses one etoh drink about 3 nights per week. No additional nsaids or supplements/herbals. Discussed donating unit of blood.  Diet and exercise recommendations discussed re: lipids, liver function, BP. Routine f/u with pcp. Copy of labs provided to pt. No further questions/concerns.

## 2021-11-04 NOTE — Progress Notes (Signed)
Reviewed RN Hildred Alamin note having some ETOH intake patient would like to try exercise and dietary changes prior to starting medication.

## 2021-11-11 ENCOUNTER — Ambulatory Visit: Payer: Self-pay | Admitting: *Deleted

## 2021-11-11 VITALS — BP 154/92

## 2021-11-11 DIAGNOSIS — R030 Elevated blood-pressure reading, without diagnosis of hypertension: Secondary | ICD-10-CM

## 2021-11-11 NOTE — Progress Notes (Signed)
BP check per pt request. No active HTN sx.

## 2021-11-15 ENCOUNTER — Other Ambulatory Visit: Payer: Self-pay | Admitting: Registered Nurse

## 2021-11-15 ENCOUNTER — Encounter: Payer: Self-pay | Admitting: Registered Nurse

## 2021-11-15 DIAGNOSIS — M461 Sacroiliitis, not elsewhere classified: Secondary | ICD-10-CM

## 2021-11-15 NOTE — Telephone Encounter (Unsigned)
Chronic etodolac use creatinine slightly elevated this year new 1.04 and chronically elevated AST/ALT stable.  Next labs to be drawn in 60 days August 2023 and patient instructed to avoid dehydration/otc medications without first consulting with provider/pharmacist and decrease alcohol intake/weight loss during lab results review May 2023.  If worsening kidney/liver function will consider discontinuing etodolac.  Electronic refill sent to patient pharmacy of choice for 90 day supply my chart message sent to patient. Etodolac '500mg'$  po BID prn pain #180 RF0

## 2021-11-22 ENCOUNTER — Ambulatory Visit: Payer: Self-pay | Admitting: *Deleted

## 2021-11-22 VITALS — BP 134/99 | HR 74

## 2021-11-22 DIAGNOSIS — R030 Elevated blood-pressure reading, without diagnosis of hypertension: Secondary | ICD-10-CM

## 2021-11-23 ENCOUNTER — Encounter: Payer: Self-pay | Admitting: Registered Nurse

## 2021-11-23 ENCOUNTER — Ambulatory Visit: Payer: Self-pay | Admitting: Registered Nurse

## 2021-11-23 VITALS — BP 144/94 | HR 75 | Temp 98.0°F | Wt 276.0 lb

## 2021-11-23 DIAGNOSIS — R42 Dizziness and giddiness: Secondary | ICD-10-CM

## 2021-11-23 DIAGNOSIS — H6983 Other specified disorders of Eustachian tube, bilateral: Secondary | ICD-10-CM

## 2021-11-23 DIAGNOSIS — Z6835 Body mass index (BMI) 35.0-35.9, adult: Secondary | ICD-10-CM

## 2021-11-23 DIAGNOSIS — R030 Elevated blood-pressure reading, without diagnosis of hypertension: Secondary | ICD-10-CM

## 2021-11-23 MED ORDER — LORATADINE 10 MG PO TABS: 10.0000 mg | ORAL_TABLET | Freq: Every day | ORAL | 11 refills | Status: DC

## 2021-11-23 MED ORDER — MECLIZINE HCL 25 MG PO TABS: 25.0000 mg | ORAL_TABLET | Freq: Three times a day (TID) | ORAL | 0 refills | Status: DC | PRN

## 2021-11-23 MED ORDER — MECLIZINE HCL 25 MG PO TABS: 25.0000 mg | ORAL_TABLET | Freq: Four times a day (QID) | ORAL | 0 refills | Status: AC | PRN

## 2021-11-23 NOTE — Patient Instructions (Signed)
Dizziness Dizziness is a common problem. It is a feeling of unsteadiness or light-headedness. You may feel like you are about to faint. Dizziness can lead to injury if you stumble or fall. Anyone can become dizzy, but dizziness is more common in older adults. This condition can be caused by a number of things, including medicines, dehydration, or illness. Follow these instructions at home: Eating and drinking  Drink enough fluid to keep your urine pale yellow. This helps to keep you from becoming dehydrated. Try to drink more clear fluids, such as water. Do not drink alcohol. Limit your caffeine intake if told to do so by your health care provider. Check ingredients and nutrition facts to see if a food or beverage contains caffeine. Limit your salt (sodium) intake if told to do so by your health care provider. Check ingredients and nutrition facts to see if a food or beverage contains sodium. Activity  Avoid making quick movements. Rise slowly from chairs and steady yourself until you feel okay. In the morning, first sit up on the side of the bed. When you feel okay, stand slowly while you hold onto something until you know that your balance is good. If you need to stand in one place for a long time, move your legs often. Tighten and relax the muscles in your legs while you are standing. Do not drive or use machinery if you feel dizzy. Avoid bending down if you feel dizzy. Place items in your home so that they are easy for you to reach without leaning over. Lifestyle Do not use any products that contain nicotine or tobacco. These products include cigarettes, chewing tobacco, and vaping devices, such as e-cigarettes. If you need help quitting, ask your health care provider. Try to reduce your stress level by using methods such as yoga or meditation. Talk with your health care provider if you need help to manage your stress. General instructions Watch your dizziness for any changes. Take  over-the-counter and prescription medicines only as told by your health care provider. Talk with your health care provider if you think that your dizziness is caused by a medicine that you are taking. Tell a friend or a family member that you are feeling dizzy. If he or she notices any changes in your behavior, have this person call your health care provider. Keep all follow-up visits. This is important. Contact a health care provider if: Your dizziness does not go away or you have new symptoms. Your dizziness or light-headedness gets worse. You feel nauseous. You have reduced hearing. You have a fever. You have neck pain or a stiff neck. Your dizziness leads to an injury or a fall. Get help right away if: You vomit or have diarrhea and are unable to eat or drink anything. You have problems talking, walking, swallowing, or using your arms, hands, or legs. You feel generally weak. You have any bleeding. You are not thinking clearly or you have trouble forming sentences. It may take a friend or family member to notice this. You have chest pain, abdominal pain, shortness of breath, or sweating. Your vision changes or you develop a severe headache. These symptoms may represent a serious problem that is an emergency. Do not wait to see if the symptoms will go away. Get medical help right away. Call your local emergency services (911 in the [place] Do not drive yourself to the hospital. Summary Dizziness is a feeling of unsteadiness or light-headedness. This condition can be caused by a number of   things, including medicines, dehydration, or illness. Anyone can become dizzy, but dizziness is more common in older adults. Drink enough fluid to keep your urine pale yellow. Do not drink alcohol. Avoid making quick movements if you feel dizzy. Monitor your dizziness for any changes. This information is not intended to replace advice given to you by your health care provider. Make sure you discuss any  questions you have with your health care provider. Document Revised: 05/04/2020 Document Reviewed: 05/04/2020 Elsevier Patient Education  [place]. Benign Positional Vertigo Vertigo is the feeling that you or your surroundings are moving when they are not. Benign positional vertigo is the most common form of vertigo. This is usually a harmless condition (benign). This condition is positional. This means that symptoms are triggered by certain movements and positions. This condition can be dangerous if it occurs while you are doing something that could cause harm to yourself or others. This includes activities such as driving or operating machinery. What are the causes? The inner ear has fluid-filled canals that help your brain sense movement and balance. When the fluid moves, the brain receives messages about your body's position. With benign positional vertigo, calcium crystals in the inner ear break free and disturb the inner ear area. This causes your brain to receive confusing messages about your body's position. What increases the risk? You are more likely to develop this condition if: You are a woman. You are 5 years of age or older. You have recently had a head injury. You have an inner ear disease. What are the signs or symptoms? Symptoms of this condition usually happen when you move your head or your eyes in different directions. Symptoms may start suddenly and usually last for less than a minute. They include: Loss of balance and falling. Feeling like you are spinning or moving. Feeling like your surroundings are spinning or moving. Nausea and vomiting. Blurred vision. Dizziness. Involuntary eye movement (nystagmus). Symptoms can be mild and cause only minor problems, or they can be severe and interfere with daily life. Episodes of benign positional vertigo may return (recur) over time. Symptoms may also improve over time. How is this diagnosed? This condition may be  diagnosed based on: Your medical history. A physical exam of the head, neck, and ears. Positional tests to check for or stimulate vertigo. You may be asked to turn your head and change positions, such as going from sitting to lying down. A health care provider will watch for symptoms of vertigo. You may be referred to a health care provider who specializes in ear, nose, and throat problems (ENT or otolaryngologist) or a provider who specializes in disorders of the nervous system (neurologist). How is this treated?  This condition may be treated in a session in which your health care provider moves your head in specific positions to help the displaced crystals in your inner ear move. Treatment for this condition may take several sessions. Surgery may be needed in severe cases, but this is rare. In some cases, benign positional vertigo may resolve on its own in 2-4 weeks. Follow these instructions at home: Safety Move slowly. Avoid sudden body or head movements or certain positions, as told by your health care provider. Avoid driving or operating machinery until your health care provider says it is safe. Avoid doing any tasks that would be dangerous to you or others if vertigo occurs. If you have trouble walking or keeping your balance, try using a cane for stability. If you feel  dizzy or unstable, sit down right away. Return to your normal activities as told by your health care provider. Ask your health care provider what activities are safe for you. General instructions Take over-the-counter and prescription medicines only as told by your health care provider. Drink enough fluid to keep your urine pale yellow. Keep all follow-up visits. This is important. Contact a health care provider if: You have a fever. Your condition gets worse or you develop new symptoms. Your family or friends notice any behavioral changes. You have nausea or vomiting that gets worse. You have numbness or a prickling  and tingling sensation. Get help right away if you: Have difficulty speaking or moving. Are always dizzy or faint. Develop severe headaches. Have weakness in your legs or arms. Have changes in your hearing or vision. Develop a stiff neck. Develop sensitivity to light. These symptoms may represent a serious problem that is an emergency. Do not wait to see if the symptoms will go away. Get medical help right away. Call your local emergency services (911 in the [place] Do not drive yourself to the hospital. Summary Vertigo is the feeling that you or your surroundings are moving when they are not. Benign positional vertigo is the most common form of vertigo. This condition is caused by calcium crystals in the inner ear that become displaced. This causes a disturbance in an area of the inner ear that helps your brain sense movement and balance. Symptoms include loss of balance and falling, feeling that you or your surroundings are moving, nausea and vomiting, and blurred vision. This condition can be diagnosed based on symptoms, a physical exam, and positional tests. Follow safety instructions as told by your health care provider and keep all follow-up visits. This is important. This information is not intended to replace advice given to you by your health care provider. Make sure you discuss any questions you have with your health care provider. Document Revised: 04/29/2020 Document Reviewed: 04/29/2020 Elsevier Patient Education  [place]. Preventing Hypertension Hypertension, also called high blood pressure, is when the force of blood pumping through the arteries is too strong. Arteries are blood vessels that carry blood from the heart throughout the body. Often, hypertension does not cause symptoms until blood pressure is very high. It is important to have your blood pressure checked regularly. Diet and lifestyle changes can help you prevent hypertension, and they may make you feel  better overall and improve your quality of life. If you already have hypertension, you may control it with diet and lifestyle changes, as well as with medicine. How can this condition affect me? Over time, hypertension can damage the arteries and decrease blood flow to important parts of the body, including the brain, heart, and kidneys. By keeping your blood pressure in a healthy range, you can help prevent complications like heart attack, heart failure, stroke, kidney failure, and vascular dementia. What can increase my risk? An unhealthy diet and a lack of physical activity can make you more likely to develop high blood pressure. Some other risk factors include: Age. The risk increases with age. Having family members who have had high blood pressure. Having certain health conditions, such as thyroid problems. Being overweight or obese. Drinking too much alcohol or caffeine. Having too much fat, sugar, calories, or salt (sodium) in your diet. Smoking or using illegal drugs. Taking certain medicines, such as antidepressants, decongestants, birth control pills, and NSAIDs, such as ibuprofen. What actions can I take to prevent or manage  this condition? Work with your health care provider to make a hypertension prevention plan that works for you. You may be referred for counseling on a healthy diet and physical activity. Follow your plan and keep all follow-up visits. Diet changes Maintain a healthy diet. This includes: Eating less salt (sodium). Ask your health care provider how much sodium is safe for you to have. The general recommendation is to have less than 1 tsp (2,300 mg) of sodium a day. Do not add salt to your food. Choose low-sodium options when grocery shopping and eating out. Limiting fats in your diet. You can do this by eating low-fat or fat-free dairy products and by eating less red meat. Eating more fruits, vegetables, and whole grains. Make a goal to eat: 1-2 cups of fresh  fruits and vegetables each day. 3-4 servings of whole grains each day. Avoiding foods and beverages that have added sugars. Eating fish that contain healthy fats (omega-3 fatty acids), such as mackerel or salmon. If you need help putting together a healthy eating plan, try the DASH diet. This diet is high in fruits, vegetables, and whole grains. It is low in sodium, red meat, and added sugars. DASH stands for Dietary Approaches to Stop Hypertension. Lifestyle changes  Lose weight if you are overweight. Losing just 3-5% of your body weight can help prevent or control hypertension. For example, if your present weight is 200 lb (91 kg), a loss of 3-5% of your weight means losing 6-10 lb (2.7-4.5 kg). Ask your health care provider to help you with a diet and exercise plan to safely lose weight. Get enough exercise. Do at least 150 minutes of moderate-intensity exercise each week. You could do this in short exercise sessions several times a day, or you could do longer exercise sessions a few times a week. For example, you could take a brisk 10-minute walk or bike ride, 3 times a day, for 5 days a week. Find ways to reduce stress, such as exercising, meditating, listening to music, or taking a yoga class. If you need help reducing stress, ask your health care provider. Do not use any products that contain nicotine or tobacco. These products include cigarettes, chewing tobacco, and vaping devices, such as e-cigarettes. Chemicals in tobacco and nicotine products raise your blood pressure each time you use them. If you need help quitting, ask your health care provider. Learn how to check your blood pressure at home. Make sure that you know your personal target blood pressure, as told by your health care provider. Try to sleep 7-9 hours per night. Alcohol use Do not drink alcohol if: Your health care provider tells you not to drink. You are pregnant, may be pregnant, or are planning to become pregnant. If you  drink alcohol: Limit how much you have to: 0-1 drink a day for women. 0-2 drinks a day for men. Know how much alcohol is in your drink. In the [place] one drink equals one 12 oz bottle of beer (355 mL), one 5 oz glass of wine (148 mL), or one 1 oz glass of hard liquor (44 mL). Medicines In addition to diet and lifestyle changes, your health care provider may recommend medicines to help lower your blood pressure. In general: You may need to try a few different medicines to find what works best for you. You may need to take more than one medicine. Take over-the-counter and prescription medicines only as told by your health care provider. Questions to ask your health care  provider What is my blood pressure goal? How can I lower my risk for high blood pressure? How should I monitor my blood pressure at home? Where to find support Your health care provider can help you prevent hypertension and help you keep your blood pressure at a healthy level. Your local hospital or your community may also provide support services and prevention programs. The American Heart Association offers an online support network at supportnetwork.heart.org Where to find more information Learn more about hypertension from: [place], Lung, and [place]: https://wilson-eaton.com/ Centers for Disease Control and Prevention: http://www.wolf.info/ American Academy of Family Physicians: familydoctor.org Learn more about the DASH diet from: [place], Lung, and [place]: https://wilson-eaton.com/ Contact a health care provider if: You think you are having a reaction to medicines you have taken. You have recurrent headaches or feel dizzy. You have swelling in your ankles. You have trouble with your vision. Get help right away if: You have sudden, severe chest, back, or abdominal pain or discomfort. You have shortness of breath. You have a sudden, severe headache. These symptoms may be an emergency. Get help right away. Call  911. Do not wait to see if the symptoms will go away. Do not drive yourself to the hospital. Summary Hypertension often does not cause any symptoms until blood pressure is very high. It is important to get your blood pressure checked regularly. Diet and lifestyle changes are important steps in preventing hypertension. By keeping your blood pressure in a healthy range, you may prevent complications like heart attack, heart failure, stroke, and kidney failure. Work with your health care provider to make a hypertension prevention plan that works for you. This information is not intended to replace advice given to you by your health care provider. Make sure you discuss any questions you have with your health care provider. Document Revised: 03/18/2021 Document Reviewed: 03/18/2021 Elsevier Patient Education  [place] Eating Plan DASH stands for Dietary Approaches to Stop Hypertension. The DASH eating plan is a healthy eating plan that has been shown to: Reduce high blood pressure (hypertension). Reduce your risk for type 2 diabetes, heart disease, and stroke. Help with weight loss. What are tips for following this plan? Reading food labels Check food labels for the amount of salt (sodium) per serving. Choose foods with less than 5 percent of the Daily Value of sodium. Generally, foods with less than 300 milligrams (mg) of sodium per serving fit into this eating plan. To find whole grains, look for the word "whole" as the first word in the ingredient list. Shopping Buy products labeled as "low-sodium" or "no salt added." Buy fresh foods. Avoid canned foods and pre-made or frozen meals. Cooking Avoid adding salt when cooking. Use salt-free seasonings or herbs instead of table salt or sea salt. Check with your health care provider or pharmacist before using salt substitutes. Do not fry foods. Cook foods using healthy methods such as baking, boiling, grilling, roasting, and broiling  instead. Cook with heart-healthy oils, such as olive, canola, avocado, soybean, or sunflower oil. Meal planning  Eat a balanced diet that includes: 4 or more servings of fruits and 4 or more servings of vegetables each day. Try to fill one-half of your plate with fruits and vegetables. 6-8 servings of whole grains each day. Less than 6 oz (170 g) of lean meat, poultry, or fish each day. A 3-oz (85-g) serving of meat is about the same size as a deck of cards. One egg equals 1  oz (28 g). 2-3 servings of low-fat dairy each day. One serving is 1 cup (237 mL). 1 serving of nuts, seeds, or beans 5 times each week. 2-3 servings of heart-healthy fats. Healthy fats called omega-3 fatty acids are found in foods such as walnuts, flaxseeds, fortified milks, and eggs. These fats are also found in cold-water fish, such as sardines, salmon, and mackerel. Limit how much you eat of: Canned or prepackaged foods. Food that is high in trans fat, such as some fried foods. Food that is high in saturated fat, such as fatty meat. Desserts and other sweets, sugary drinks, and other foods with added sugar. Full-fat dairy products. Do not salt foods before eating. Do not eat more than 4 egg yolks a week. Try to eat at least 2 vegetarian meals a week. Eat more home-cooked food and less restaurant, buffet, and fast food. Lifestyle When eating at a restaurant, ask that your food be prepared with less salt or no salt, if possible. If you drink alcohol: Limit how much you use to: 0-1 drink a day for women who are not pregnant. 0-2 drinks a day for men. Be aware of how much alcohol is in your drink. In the [place] one drink equals one 12 oz bottle of beer (355 mL), one 5 oz glass of wine (148 mL), or one 1 oz glass of hard liquor (44 mL). General information Avoid eating more than 2,300 mg of salt a day. If you have hypertension, you may need to reduce your sodium intake to 1,500 mg a day. Work with your health care  provider to maintain a healthy body weight or to lose weight. Ask what an ideal weight is for you. Get at least 30 minutes of exercise that causes your heart to beat faster (aerobic exercise) most days of the week. Activities may include walking, swimming, or biking. Work with your health care provider or dietitian to adjust your eating plan to your individual calorie needs. What foods should I eat? Fruits All fresh, dried, or frozen fruit. Canned fruit in natural juice (without added sugar). Vegetables Fresh or frozen vegetables (raw, steamed, roasted, or grilled). Low-sodium or reduced-sodium tomato and vegetable juice. Low-sodium or reduced-sodium tomato sauce and tomato paste. Low-sodium or reduced-sodium canned vegetables. Grains Whole-grain or whole-wheat bread. Whole-grain or whole-wheat pasta. Brown rice. [place]. Bulgur. Whole-grain and low-sodium cereals. Pita bread. Low-fat, low-sodium crackers. Whole-wheat flour tortillas. Meats and other proteins Skinless chicken or [place]. Ground chicken or [place]. Pork with fat trimmed off. Fish and seafood. Egg whites. Dried beans, peas, or lentils. Unsalted nuts, nut butters, and seeds. Unsalted canned beans. Lean cuts of beef with fat trimmed off. Low-sodium, lean precooked or cured meat, such as sausages or meat loaves. Dairy Low-fat (1%) or fat-free (skim) milk. Reduced-fat, low-fat, or fat-free cheeses. Nonfat, low-sodium ricotta or cottage cheese. Low-fat or nonfat yogurt. Low-fat, low-sodium cheese. Fats and oils Soft margarine without trans fats. Vegetable oil. Reduced-fat, low-fat, or light mayonnaise and salad dressings (reduced-sodium). Canola, safflower, olive, avocado, soybean, and sunflower oils. Avocado. Seasonings and condiments Herbs. Spices. Seasoning mixes without salt. Other foods Unsalted popcorn and pretzels. Fat-free sweets. The items listed above may not be a complete list of foods and beverages you can eat. Contact  a dietitian for more information. What foods should I avoid? Fruits Canned fruit in a light or heavy syrup. Fried fruit. Fruit in cream or butter sauce. Vegetables Creamed or fried vegetables. Vegetables in a cheese sauce. Regular canned vegetables (not  low-sodium or reduced-sodium). Regular canned tomato sauce and paste (not low-sodium or reduced-sodium). Regular tomato and vegetable juice (not low-sodium or reduced-sodium). Angie Fava. Olives. Grains Baked goods made with fat, such as croissants, muffins, or some breads. Dry pasta or rice meal packs. Meats and other proteins Fatty cuts of meat. Ribs. Fried meat. Berniece Salines. Bologna, salami, and other precooked or cured meats, such as sausages or meat loaves. Fat from the back of a pig (fatback). Bratwurst. Salted nuts and seeds. Canned beans with added salt. Canned or smoked fish. Whole eggs or egg yolks. Chicken or [place] with skin. Dairy Whole or 2% milk, cream, and half-and-half. Whole or full-fat cream cheese. Whole-fat or sweetened yogurt. Full-fat cheese. Nondairy creamers. Whipped toppings. Processed cheese and cheese spreads. Fats and oils Butter. Stick margarine. Lard. Shortening. Ghee. Bacon fat. Tropical oils, such as coconut, palm kernel, or palm oil. Seasonings and condiments Onion salt, garlic salt, seasoned salt, table salt, and sea salt. Worcestershire sauce. Tartar sauce. Barbecue sauce. Teriyaki sauce. Soy sauce, including reduced-sodium. Steak sauce. Canned and packaged gravies. Fish sauce. Oyster sauce. Cocktail sauce. Store-bought horseradish. Ketchup. Mustard. Meat flavorings and tenderizers. Bouillon cubes. Hot sauces. Pre-made or packaged marinades. Pre-made or packaged taco seasonings. Relishes. Regular salad dressings. Other foods Salted popcorn and pretzels. The items listed above may not be a complete list of foods and beverages you should avoid. Contact a dietitian for more information. Where to find more  information National Heart, Lung, and Blood Institute: https://wilson-eaton.com/ American Heart Association: www.heart.org Academy of Nutrition and Dietetics: www.eatright.[place]: www.kidney.org Summary The DASH eating plan is a healthy eating plan that has been shown to reduce high blood pressure (hypertension). It may also reduce your risk for type 2 diabetes, heart disease, and stroke. When on the DASH eating plan, aim to eat more fresh fruits and vegetables, whole grains, lean proteins, low-fat dairy, and heart-healthy fats. With the DASH eating plan, you should limit salt (sodium) intake to 2,300 mg a day. If you have hypertension, you may need to reduce your sodium intake to 1,500 mg a day. Work with your health care provider or dietitian to adjust your eating plan to your individual calorie needs. This information is not intended to replace advice given to you by your health care provider. Make sure you discuss any questions you have with your health care provider. Document Revised: 05/03/2019 Document Reviewed: 05/03/2019 Elsevier Patient Education  [place].

## 2021-11-23 NOTE — Progress Notes (Signed)
Subjective:    Patient ID: Bruce Kemp, male    DOB: Oct 24, 1985, 36 y.o.   MRN: [id number]  35y/o male established pt c/o dizziness upon position changes or quick head movements past 2 days. "Free falling sensation." Not present today. BP elevated past 2 days as well. Does endorse eating seafood boil over the weekend and wonders if sodium content was too high for him.  Stopped his methylphenidate 2 weeks ago as he felt it was cause of his elevated heart rate.  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  Has been urinating normal amount and color for him  Yellow in toilet.  Had  a dry mouth when he woke up next day after eating seafood so pushed water the next day.  Feeling normal hydration level today and hasn't had any symptoms.  Pulse running 70s today and typically 70s-80s now per patient on garmin Intermittent fasting 05-1999 his eating hours lunch first meal premier protein shake/banana; dinner salad shrimp, pork chop or steak putting cucumbers, tomatoes, cheese on top of salad Soda usually diet; not eating out as much e.g. jr cheeseburger, trying to watch portion sizes more now and has been losing weight  Seasonal allergies in the spring doesn't seem to be bothering him now so stopped Claritin but has some at home      Review of Systems  Constitutional:  Negative for activity change, appetite change, chills, diaphoresis, fatigue and fever.  HENT:  Negative for congestion, trouble swallowing and voice change.   Eyes:  Negative for photophobia.  Respiratory:  Negative for cough, shortness of breath, wheezing and stridor.   Cardiovascular:  Negative for chest pain and palpitations.  Gastrointestinal:  Negative for abdominal pain, diarrhea, nausea and vomiting.  Endocrine: Negative for cold intolerance and heat intolerance.  Genitourinary:  Negative for decreased urine volume, difficulty urinating, enuresis, flank pain and frequency.  Musculoskeletal:  Negative  for arthralgias, back pain, gait problem, joint swelling, myalgias, neck pain and neck stiffness.  Skin:  Negative for color change and rash.  Allergic/Immunologic: Positive for environmental allergies. Negative for food allergies.  Neurological:  Positive for dizziness and light-headedness. Negative for tremors, seizures, syncope, facial asymmetry, speech difficulty, weakness, numbness and headaches.  Hematological:  Negative for adenopathy. Does not bruise/bleed easily.  Psychiatric/Behavioral:  Negative for agitation, confusion and sleep disturbance.        Objective:   Physical Exam Vitals and nursing note reviewed.  Constitutional:      General: He is awake. He is not in acute distress.    Appearance: Normal appearance. He is well-developed and well-groomed. He is obese. He is not ill-appearing, toxic-appearing or diaphoretic.  HENT:     Head: Normocephalic and atraumatic.     Jaw: There is normal jaw occlusion.     Salivary Glands: Right salivary gland is not diffusely enlarged or tender. Left salivary gland is not diffusely enlarged or tender.     Right Ear: Hearing and external ear normal. A middle ear effusion is present.     Left Ear: Hearing and external ear normal. A middle ear effusion is present.     Nose: Nose normal. No congestion or rhinorrhea.     Right Turbinates: Not enlarged, swollen or pale.     Left Turbinates: Not enlarged, swollen or pale.     Right Sinus: No maxillary sinus tenderness or frontal sinus tenderness.     Left Sinus: No maxillary sinus tenderness or frontal  sinus tenderness.     Mouth/Throat:     Lips: Pink. No lesions.     Mouth: Mucous membranes are moist. No lacerations, oral lesions or angioedema.     Dentition: No gum lesions.     Tongue: No lesions. Tongue does not deviate from midline.     Palate: No mass and lesions.     Pharynx: Uvula midline. Pharyngeal swelling and posterior oropharyngeal erythema present. No oropharyngeal exudate or  uvula swelling.     Tonsils: No tonsillar exudate or tonsillar abscesses. 0 on the right. 0 on the left.     Comments: Bilateral allergic shiners; cobblestoning posterior pharynx; bilateral TMs intact air fluid level noted clear;  Eyes:     General: Lids are normal. Vision grossly intact. Gaze aligned appropriately. Allergic shiner present. No scleral icterus.       Right eye: No discharge.        Left eye: No discharge.     Extraocular Movements: Extraocular movements intact.     Right eye: Normal extraocular motion and no nystagmus.     Left eye: Normal extraocular motion and no nystagmus.     Conjunctiva/sclera: Conjunctivae normal.     Right eye: Right conjunctiva is not injected.     Left eye: Left conjunctiva is not injected.     Pupils: Pupils are equal, round, and reactive to light.  Neck:     Trachea: Trachea and phonation normal. No tracheal deviation.  Cardiovascular:     Rate and Rhythm: Normal rate and regular rhythm.     Pulses: Normal pulses.          Radial pulses are 2+ on the right side and 2+ on the left side.     Heart sounds: Normal heart sounds, S1 normal and S2 normal.  Pulmonary:     Effort: Pulmonary effort is normal.     Breath sounds: Normal breath sounds and air entry. No stridor, decreased air movement or transmitted upper airway sounds. No decreased breath sounds, wheezing, rhonchi or rales.     Comments: Spoke full sentences without difficulty; no cough observed in exam room Abdominal:     General: Abdomen is flat.  Musculoskeletal:        General: Normal range of motion.     Cervical back: Normal range of motion and neck supple. No swelling, edema, deformity, erythema, signs of trauma, lacerations, rigidity, spasms, torticollis, tenderness or crepitus. No pain with movement or muscular tenderness. Normal range of motion.     Thoracic back: No swelling, edema, deformity, signs of trauma or lacerations. Normal range of motion.     Right lower leg: No  edema.     Left lower leg: No edema.  Lymphadenopathy:     Head:     Right side of head: No submandibular or preauricular adenopathy.     Left side of head: No submandibular or preauricular adenopathy.     Cervical: No cervical adenopathy.     Right cervical: No superficial cervical adenopathy.    Left cervical: No superficial cervical adenopathy.  Skin:    General: Skin is warm and dry.     Capillary Refill: Capillary refill takes less than 2 seconds.     Coloration: Skin is not ashen, cyanotic, jaundiced, mottled, pale or sallow.     Findings: No abrasion, abscess, acne, bruising, burn, ecchymosis, erythema, signs of injury, laceration, lesion, petechiae, rash or wound.     Nails: There is no clubbing.  Neurological:  General: No focal deficit present.     Mental Status: He is alert and oriented to person, place, and time. Mental status is at baseline.     GCS: GCS eye subscore is 4. GCS verbal subscore is 5. GCS motor subscore is 6.     Cranial Nerves: Cranial nerves 2-12 are intact. No cranial nerve deficit, dysarthria or facial asymmetry.     Sensory: Sensation is intact.     Motor: Motor function is intact. No weakness, tremor, atrophy, abnormal muscle tone or seizure activity.     Coordination: Coordination is intact. Coordination normal.     Gait: Gait is intact. Gait normal.     Comments: In/out of chair and on/off exam table without difficulty; gait sure and steady in clinic; bilateral hand grasp equal 5/5  Psychiatric:        Attention and Perception: Attention and perception normal.        Mood and Affect: Mood and affect normal.        Speech: Speech normal.        Behavior: Behavior normal. Behavior is cooperative.        Thought Content: Thought content normal.        Cognition and Memory: Cognition and memory normal.        Judgment: Judgment normal.      Reviewed historical Be Well results 2022 3/29 150/98 weight 284lbs 2020 2/6 260lbs 2021 3/19 130/82   2019 8/13 136/86 245 lbs 2017 office visit 125/58 Reviewed epic encounter results/BP for the past year also 11/02/20   BP 118/76 Temp 98.1 F (36.7 C) Ht '6\' 1"'$  (1.854 m) Wt 284        Assessment & Plan:   A-dizziness nonspecific, eustachian tube dysfunction bilaterally; obesity  P-discussed may be blood pressure, salt intake, dehydration, fluid in ears and/or all causing symptoms this weekend. Consider omron home BP machine  sent [email or web address] website along with how to perform home BP monitoring by [employer] article See RN Hildred Alamin weekly for BP checks Going to discuss with mother what BP medications grandparents and possibly mom take; would rather take medication if BP elevated above 130/80 until he can lose enough weight to get it down to 120/70s.   Congratulated patient on 4 lb weight loss 1 lb/week since Be Well check. Continue weight loss/dash diet/exercise/hydrating with water Discussed 260lbs seemed to be when his blood pressure elevated and getting his weight under that amount would be helpful based on chart review. Patient agreed with plan of care and had no further questions at this time.  Effusion bilateral ears on exam today  restart claritin '10mg'$  po daily x 1 month.  No evidence of invasive bacterial infection, non toxic and well hydrated.  I do not see where any further testing or imaging is necessary at this time.   I will suggest supportive care, rest, good hygiene and encourage the patient to take adequate fluids.  The patient is to return to clinic or EMERGENCY ROOM if symptoms worsen or change significantly e.g. ear pain, fever, purulent discharge from ears or bleeding.  Exitcare handout on otitis media and eustachian tube dysfunction printed and given to patient.  Discussed with patient post nasal drip irritates throat/causes swelling blocks eustachian tubes from draining and fluid fills up middle ear.  Bacteria/viruses can grow in fluid and with moving head tube compressed  and increases pressure in tube/ear worsening pain.  Studies show will take 30 days for fluid to resolve after post nasal  drip controlled with nasal steroid/antihistamine. Antibiotics and steroids do not speed up fluid removal.  Patient verbalized agreement and understanding of treatment plan and had no further questions at this time.   Discussed with patient otitis media with effusion probably causing vertigo but could also be age. Given 1 UD claritin '10mg'$  po from clinic today restart at home daily this month.. Supportive treatment may take up to 4 doses meclizine per day max '100mg'$  per 24 hours electronic Rx #30 RF0 sent to his pharmacy of choice or may purchase OTC dramamine look for active ingredient meclizine on box typically '50mg'$ /tab may cause more drowsiness higher dose could cut in half.  Someone to drive him home recommended not driving during vertigo episodes. Follow up if aphasia, dysphasia, visual changes, weakness, fall, worst headache of life, incoordination, fever, ear discharge. Consider ENT evaluation/follow up with PCM if worsening symptoms not controlled with meclizine.  Exitcare handout on vertigo.   Patient verbalized understanding of information/agreed with plan of care and had no further questions at this time.    Discussed portion sizes recommended servings per day e.g. protein 4 oz deck of cards, cheese shredded 1/4 cup, salad dressing 2 tablespoons, greens 2 cups other vegetables chopped 1/2 cup  [employer] dietitian available for free consult x 6 visits if he wants weight loss assistance.  Patient verbalized understanding information and had no further questions at this time.  Discussed blood pressure and pulse improved off stimulant.  If blood pressure elevated avoid caffeine, salt added in diet.  Discussed eat potassium rich foods e.g. banana, potatoes may look online for list of potassium rich foods especially if having salty diet choices that day along with drinking water to get rid  of excess salt in body; daily no potassium supplement/pill unless leg cramps after heavy sweating/exercise.  Avoid strenuous exercises if dizzy.   Discussed walking or biking without resistance/stretching good exercises when starting new exercise program to work up to longer duration or activities e.g. martial arts, jumping, high intensity work outs.   Drink water to keep urine pale yellow clear and voiding every 4 hours when awake.  See RN Hildred Alamin for follow up BP/weight checks weekly and/or keep home log.  Patient verbalized understanding information/instructions, agreed with plan of care and had no further questions at this time.

## 2021-12-09 ENCOUNTER — Ambulatory Visit: Payer: No Typology Code available for payment source | Admitting: *Deleted

## 2021-12-09 VITALS — BP 150/90

## 2021-12-09 DIAGNOSIS — R030 Elevated blood-pressure reading, without diagnosis of hypertension: Secondary | ICD-10-CM

## 2021-12-09 NOTE — Progress Notes (Signed)
Discussed HTN and f/u with NP Otila Kluver after her return from vacation. Alternative form for Be Well completed based on last 4 visits since 10/26/21 initial be well appt.

## 2021-12-13 ENCOUNTER — Other Ambulatory Visit: Payer: Self-pay

## 2021-12-13 NOTE — Telephone Encounter (Signed)
LOV 06/18/20 Last refill 05/27/21, #30, 0 refills  Please review, thanks!

## 2021-12-14 IMAGING — DX DG CHEST 1V PORT
1 series · 1 of 1 positions shown · non-contrast
Comparison: None.

CLINICAL DATA: Shortness of breath with nausea and chills 4 days.

EXAM:
PORTABLE CHEST 1 VIEW

[chest ap]
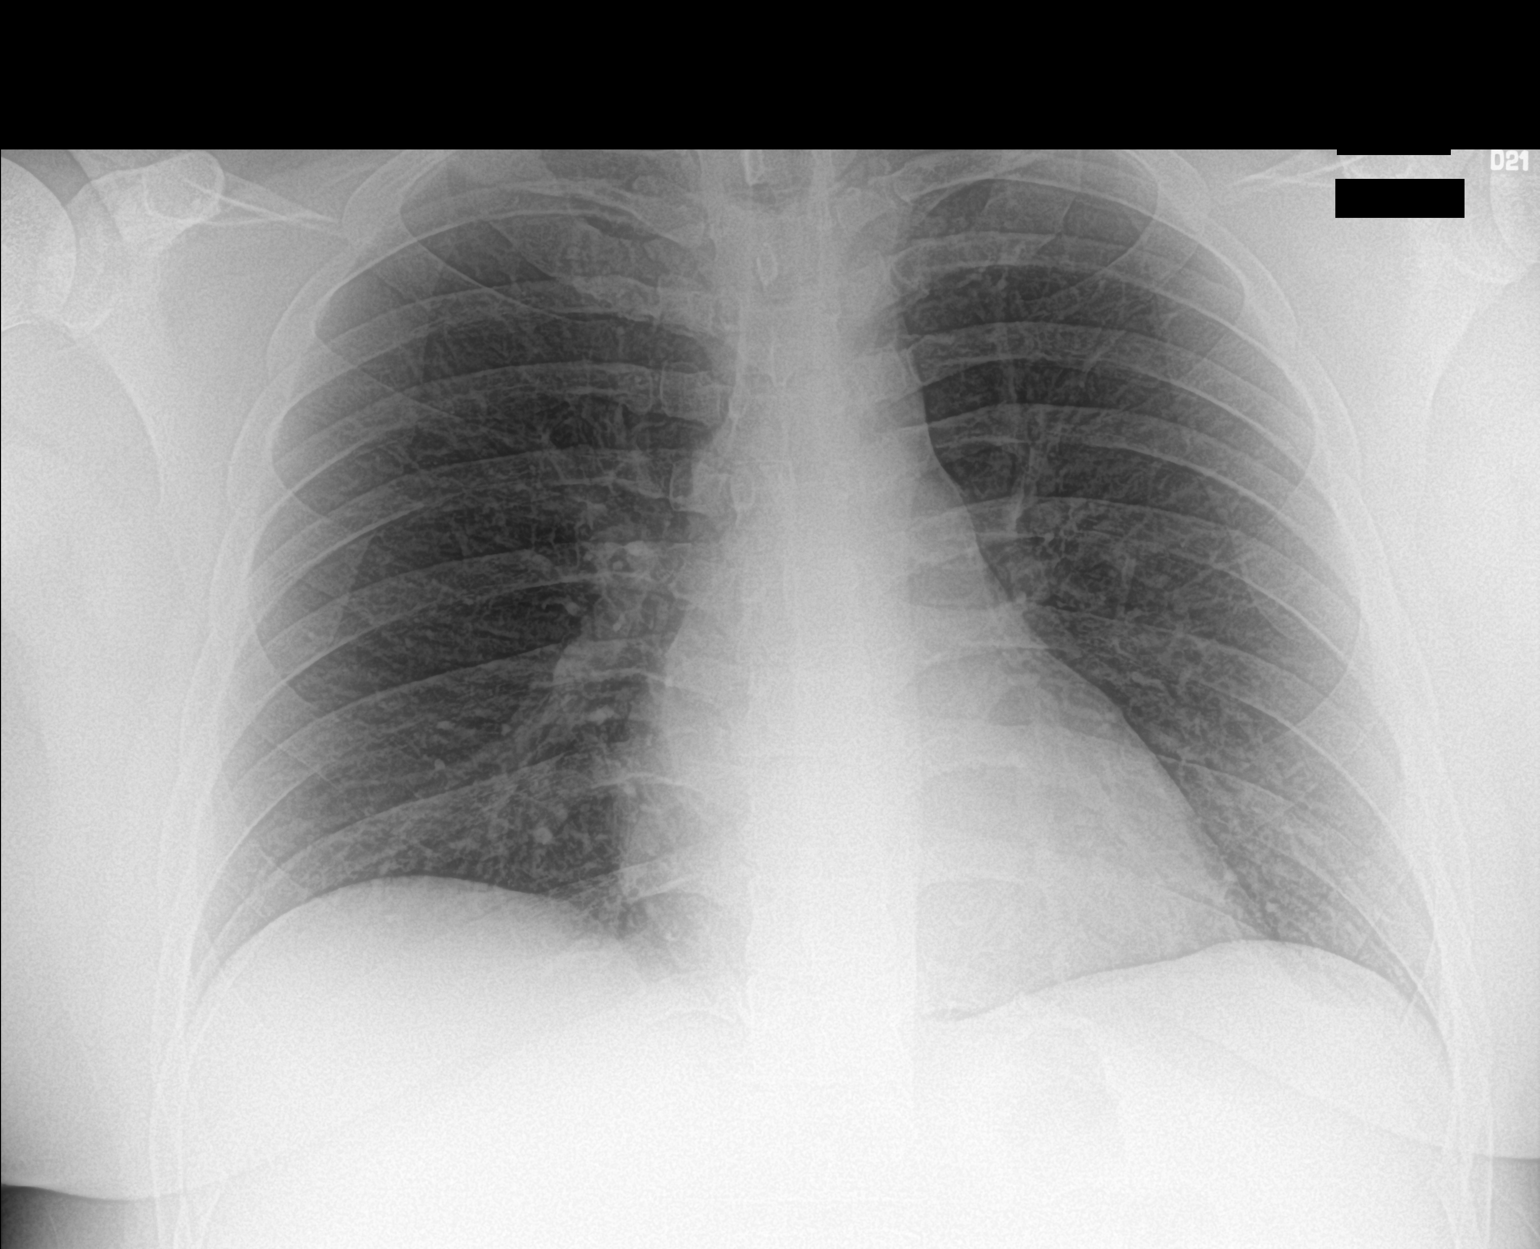

[1 of 1 positions shown; findings below may reference images not displayed]

FINDINGS: Lungs are adequately inflated and otherwise clear. Cardiomediastinal
silhouette, bones and soft tissues are normal.
IMPRESSION: No active disease.

## 2021-12-14 MED ORDER — ALPRAZOLAM 0.5 MG PO TABS: 0.5000 mg | ORAL_TABLET | Freq: Every evening | ORAL | 0 refills | Status: DC | PRN

## 2022-01-28 ENCOUNTER — Other Ambulatory Visit: Payer: Self-pay | Admitting: Registered Nurse

## 2022-01-28 ENCOUNTER — Ambulatory Visit: Payer: No Typology Code available for payment source

## 2022-01-28 DIAGNOSIS — R7989 Other specified abnormal findings of blood chemistry: Secondary | ICD-10-CM

## 2022-01-28 NOTE — Patient Instructions (Signed)
Labs drawn today x 1 stick.  23G butterfly L antecubital without difficulty.  Pt slightly dehydrated.  Instructed patient to increase fluid intake.

## 2022-01-29 LAB — BASIC METABOLIC PANEL
BUN/Creatinine Ratio: 13 (ref 9–20)
BUN: 13 mg/dL (ref 6–20)
CO2: 24 mmol/L (ref 20–29)
Calcium: 9.5 mg/dL (ref 8.7–10.2)
Chloride: 103 mmol/L (ref 96–106)
Creatinine, Ser: 1.03 mg/dL (ref 0.76–1.27)
Glucose: 99 mg/dL (ref 70–99)
Potassium: 4.6 mmol/L (ref 3.5–5.2)
Sodium: 142 mmol/L (ref 134–144)
eGFR: 97 mL/min/{1.73_m2} (ref >59)

## 2022-01-30 ENCOUNTER — Telehealth: Payer: Self-pay | Admitting: Registered Nurse

## 2022-01-30 DIAGNOSIS — E785 Hyperlipidemia, unspecified: Secondary | ICD-10-CM

## 2022-01-30 DIAGNOSIS — E6609 Other obesity due to excess calories: Secondary | ICD-10-CM

## 2022-01-30 NOTE — Telephone Encounter (Signed)
My chart message sent to patient.  Epic reviewed LFTs, CBC, lipid panel due in 3-6 months and recommended patient donate 1 unit PRBCs before next labs.    Bruce Kemp,   Your kidney function, blood sugar and electrolytes were all normal on recheck.  Results copied from [name] portal haven't crossed over into Epic yet below.  Please let me know if you have further questions or concerns.   Reminder CBC and cholesterol repeat testing in 3-6 months after you have been on your new diet/exercise program 6 months.  Reminder also recommended to donate a unit of blood-red blood cells prior to next lab draw also.   Please let me know if you want results printed or sent to your [facility] also.   Sincerely,   Gerarda Fraction NP-C   Glucose            99 mg/dL  70 - 99 BUN      13 mg/dL  6 - 20 Creatinine         1.03 mg/dL  0.76 - 1.27 eGFR    97                    mL/min/1.73     BUN/Creatinine Ratio  13 9 - 20 Sodium             142 mmol/L            134 - 144 Potassium        4.6 mmol/L            3.5 - 5.2 Chloride            103 mmol/L            96 - 106 Carbon Dioxide, Total  24 mmol/L            20 - 29 Calcium            9.5 mg/dL  8.7 - 10.2

## 2022-02-05 NOTE — Telephone Encounter (Signed)
Noted patient had not read my chart message yet.  Contacted via telephone discussed normal results for kidney function/electrolytes/blood sugar.  Recheck liver function tests, lipids, weight and blood pressure in 3 to 6 months nonfasting after he has been on his weight loss/diet regimen at least 3 months.  Reminded to donate unit PRBCs prior to next labs (between now and then not that week)  Patient verbalized understanding information/instructions, agreed with plan of care and had no further questions at this time.

## 2022-02-16 ENCOUNTER — Other Ambulatory Visit: Payer: Self-pay | Admitting: Registered Nurse

## 2022-02-16 ENCOUNTER — Encounter: Payer: Self-pay | Admitting: Registered Nurse

## 2022-02-16 DIAGNOSIS — M461 Sacroiliitis, not elsewhere classified: Secondary | ICD-10-CM

## 2022-02-16 NOTE — Telephone Encounter (Signed)
Patient contacted via telephone stated has been on new exercise program but not doing as well with his diet planning to make further changes.  Has noticed he has gained muscle mass with exercise.  Still using etodolac 545m po BID prn pain hips and running low would like refill sent to his pharmacy.  Electronic Rx etodolac 5063mpo BID prn pain #180 RF1 to his pharmacy of choice.  Encouraged patient to continue exercising and making healthy dietary choices.  Patient verbalized understanding information/instructions, agreed with plan of care and had no further questions at this time.  Last labs 8/18 and 10/26/21 normal renal function and slightly elevated AST/ALT with obesity/hyperlipidemia most likely NASH but could be medication related also.  Previously discussed weight loss/less than 2 servings alcohol per day with patient.   Latest Reference Range & Units 10/26/21 09:30 01/28/22 1001:09BASIC METABOLIC PANEL   Rpt  Sodium 134 - 144 mmol/L 139 142  Potassium 3.5 - 5.2 mmol/L 4.6 4.6  Chloride 96 - 106 mmol/L 99 103  CO2 20 - 29 mmol/L  24  Glucose 70 - 99 mg/dL 90 99  BUN 6 - 20 mg/dL 11 13  Creatinine 0.76 - 1.27 mg/dL 1.04 (H) 1.03  Calcium 8.7 - 10.2 mg/dL 9.7 9.5  BUN/Creatinine Ratio 9 - 20  11 13   eGFR >59 mL/min/1.73 72 97  Phosphorus 3.0 - 4.3 mg/dL 3.1   Alkaline Phosphatase 44 - 121 IU/L 60   Albumin 3.8 - 4.8 g/dL 4.6   Albumin/Globulin Ratio 1.2 - 2.2  1.6   Uric Acid 2.6 - 6.2 mg/dL 4.8   AST 0 - 40 IU/L 44 (H)   ALT 0 - 32 IU/L 61 (H)   Total Protein 6.0 - 8.5 g/dL 7.5   Total Bilirubin 0.0 - 1.2 mg/dL 1.0   GGT 0 - 60 IU/L 38   (H): Data is abnormally high Rpt: View report in Results Review for more information

## 2022-03-02 ENCOUNTER — Other Ambulatory Visit: Payer: Self-pay | Admitting: Family Medicine

## 2022-03-02 DIAGNOSIS — K2100 Gastro-esophageal reflux disease with esophagitis, without bleeding: Secondary | ICD-10-CM

## 2022-03-02 NOTE — Telephone Encounter (Signed)
Requested Prescriptions  Pending Prescriptions Disp Refills  . pantoprazole (PROTONIX) 40 MG tablet [Pharmacy Med Name: PANTOPRAZOLE SOD DR 40 MG TAB] 90 tablet 0    Sig: TAKE 1 TABLET BY MOUTH EVERY DAY     Gastroenterology: Proton Pump Inhibitors Passed - 03/02/2022  2:14 AM      Passed - Valid encounter within last 12 months    Recent Outpatient Visits          9 months ago Sore throat   [place] Eulogio Bear, NP   9 months ago Erroneous encounter - disregard   [place] Eulogio Bear, NP   1 year ago Dermoid cyst of lower extremity, left   [place] Eulogio Bear, NP   1 year ago Fever, unspecified fever cause   [place] Pickard, Cammie Mcgee, MD   2 years ago Elevated LFTs   [place] Pickard, Cammie Mcgee, MD

## 2022-03-23 ENCOUNTER — Telehealth: Payer: Self-pay | Admitting: Registered Nurse

## 2022-03-23 ENCOUNTER — Encounter: Payer: Self-pay | Admitting: Registered Nurse

## 2022-03-23 DIAGNOSIS — J019 Acute sinusitis, unspecified: Secondary | ICD-10-CM

## 2022-03-23 DIAGNOSIS — J069 Acute upper respiratory infection, unspecified: Secondary | ICD-10-CM

## 2022-03-23 MED ORDER — PSEUDOEPH-BROMPHEN-DM 30-2-10 MG/5ML PO SYRP: 5.0000 mL | ORAL_SOLUTION | Freq: Four times a day (QID) | ORAL | 0 refills | Status: DC | PRN

## 2022-03-23 NOTE — Telephone Encounter (Signed)
Received message from patient  "" Bruce Kemp, earlier today I had a dry cough and lower energy than usual.  At 5 I logged off and went to sleep, after waking up I've got heavy chills and bad body aching.  Covid test is negative and temperature has only read at 100 to this point.Maybe you could give me a call tomorrow?"  Contacted via telephone 03/23/22.  He didn't sleep well last night.  He has taken tylenol, etodolac yesterday.  Covid test negative at bedtime yesterday.  No known sick contacts.  Some fall allergies noted but typically no fever/chills when he has allergy flare.  Discussed most likely viral symptoms on top of allergies.  Wants to try bromphed DM and honey.  Has albuterol inhaler at home for prn use.  Not throwing up with cough just feels like thick sputum won't come out consider mucinex/guaiafensan '600mg'$  po BID OTC to break up mucous and hydrate.  Patient plans to drink tea with honey today.  Not onsite at [employer] worksite until Friday  03/25/22  repeat covid test again tomorrow 03/24/22 if still having symptoms not of usual allergies.  Discussed RSV/covid/flu and adenovirus suspected circulating in community at this time. Consider isolating from others/mask wear until second negative covid test at least 24 hours apart/preferably 48 hours between testing.  Patient scheduled to work remote next 48 hours (his usual schedule). Nonproductive cough/throat clearing audible during call  9 minutes duration.  Electronic Rx sent to his pharmacy of choice bromphed dm 30-2-'10mg'$ / 71m po QID prn cough/rhintiis #120 RF0.  Tessalon pearles have not helped in the past. Hold zyrtec/allegra/claritin/nyquil/dayquil if taking bromphed  Mildly sore throat feels like post nasal drip to patient.  Spoke full sentences without difficulty A&Ox3.  Patient verbalized understanding information/instructions, agreed with plan of care and had no further questions at this time.

## 2022-03-26 NOTE — Telephone Encounter (Signed)
Patient contacted via telephone stated he took sick days from work Wed-Fri and did not go onsite as planned. Not having sore throat in am on awakening now.   He did work remote for 3 hours yesterday.  Cough worse today but feeling better since starting prednisone/azithromycin/bromphed DM.  Stopped claritin per pharmacist.  Discussed if not taking 4 doses bromphed DM per day may take claritin.  Currently he is taking bromphed DM every 6 hours so holding claritin.  Has not found inhaler albuterol yet will look again tonight.  Discussed with patient rain/wind/cooler temps most likely affecting runny nose/post nasal drip along with fluctuating barometric pressures.  Look for his albuterol inhaler again and take 2 puffs tonight before bed and tomorrow am/pm and prn mid day as even cooler temperatures forecasted/hvac most likely will be running and could trigger bronchospasms in already irritated airways.  Duration of call 5 minutes  Patient spoke full sentences without difficulty A&Ox3 no audible cough mild nasal congestion noted.  Patient verbalized understanding information/instructions, agreed with plan of care and had no further questions at this time.

## 2022-03-28 ENCOUNTER — Ambulatory Visit: Payer: No Typology Code available for payment source

## 2022-03-28 ENCOUNTER — Telehealth: Payer: Self-pay | Admitting: Registered Nurse

## 2022-03-28 VITALS — BP 112/72 | HR 76 | Temp 97.9°F

## 2022-03-28 DIAGNOSIS — J209 Acute bronchitis, unspecified: Secondary | ICD-10-CM

## 2022-03-28 DIAGNOSIS — J069 Acute upper respiratory infection, unspecified: Secondary | ICD-10-CM

## 2022-03-28 MED ORDER — PREDNISONE 10 MG PO TABS: ORAL_TABLET | ORAL | 0 refills | Status: AC

## 2022-03-28 MED ORDER — ALBUTEROL SULFATE HFA 108 (90 BASE) MCG/ACT IN AERS: 1.0000 | INHALATION_SPRAY | RESPIRATORY_TRACT | 0 refills | Status: DC | PRN

## 2022-03-28 MED ORDER — PSEUDOEPH-BROMPHEN-DM 30-2-10 MG/5ML PO SYRP: 5.0000 mL | ORAL_SOLUTION | Freq: Four times a day (QID) | ORAL | 0 refills | Status: AC | PRN

## 2022-03-28 NOTE — Progress Notes (Signed)
Lung sounds diminished bilaterally.

## 2022-03-28 NOTE — Telephone Encounter (Signed)
Sp02 decreased 96% and diminished breath sounds per RN Stone no wheezing/rhonchi/rhales audible; afebrile, BP/pulse stable and frequent nonproductive cough.  Patient to start new prednisone taper '10mg'$  po with breakfast '40mg'$  x2 days, '30mg'$  x 2 days, '20mg'$  x 2 days and '10mg'$  x 2 days #21 RF0 dispensed to patient from [facility] today.  He will use albuterol inhaler every 4 hours while awake new Rx sent to his pharmacy of choice earlier today and continue bromphed DM prn rhintiis/cough and azithromycin until finished all doses (initiated for sinusitis).  Discussed cold air/hvac can flare symptoms due to bronchial inflammation from infection/post nasal drip.  Consider humidifier use at home/mask wear/coviering nose/mouth when outside early am/evening/nights due to temps 40s.  Patient agreed with plan of care and had no further questions at this time.

## 2022-03-28 NOTE — Telephone Encounter (Signed)
Patient left message asking me to contact him this am via telephone.  Patient using his girlfriend's albuterol inhaler couldn't find his this weekend.  After being outside cough worsening.  Still taking prednisone '20mg'$  po daily leftover from previous; azithromycin has 1-2 tabs left to take.  Bromphed DM helping some taking every 6 hours.  Dry cough frequent.  At work today.  Instructed patient to see RN Stone for vital signs and lung sound check.  Discussed azithromycin covers for bronchitis/pneumonia bacterial.  Prednisone helps with bronchial inflammation/viral/allergic but we may need to increase his dose.  Albuterol inhaler Rx electronic sent to his pharmacy of choice today #1 RF1 along with bromphed DM refill #240m RF0.  Patient agreed with plan of care and had no further questions at this time.  Duration of call 5 minutes.  Dry intermittent cough/throat clearing/nasal congestion audible during call.  A&Ox3 spoke full sentences without difficulty.  No audible wheeze/shortness of breath audible.  Message left for RN SVinnie Levelto check lung sounds and VS including sp02 and contact NP with results.

## 2022-03-28 NOTE — Telephone Encounter (Signed)
See tcon dated 03/28/22 had RN visit with Vinnie Level today.

## 2022-04-01 ENCOUNTER — Encounter: Payer: Self-pay | Admitting: Registered Nurse

## 2022-04-01 ENCOUNTER — Telehealth: Payer: Self-pay | Admitting: Registered Nurse

## 2022-04-01 DIAGNOSIS — J019 Acute sinusitis, unspecified: Secondary | ICD-10-CM

## 2022-04-01 MED ORDER — AMOXICILLIN 875 MG PO TABS: 875.0000 mg | ORAL_TABLET | Freq: Two times a day (BID) | ORAL | 0 refills | Status: AC

## 2022-04-01 NOTE — Telephone Encounter (Signed)
Patient reported sinus pain/pressure worsening mucous yesterday yellow thick cloudy.  Started left over amoxicillin at home does not have enough doses to complete 5 day course.  Dispensed amoxicillin '875mg'$  po BID x 10 days #20 RF0 to girlfriend Jeannette Corpus today per patient request as he is working remote and she will deliver to him at home.  Discussed with patient to take at leave 5 days dosing if all symptoms resolved may stop at that time but if symptoms continuing may take up to all 10 days.  May continue plan of care as previously discussed with nasal saline/bromphed dm/albuterol inhaler/prednisone.  Follow up with NP/clinic staff if new or worsening symptoms this weekend/next week.  Patient verbalized understanding information/instructions, agreed with plan of care and had no further questions at this time.

## 2022-04-11 NOTE — Telephone Encounter (Signed)
Contacted patient to follow up symptoms.  "I feel like today it is finally close to being over.  This is the longest I have ever battled an illness tomorrow will be 3 weeks.  Last week the symptoms still persisted, mostly mild, just mucus from my sinuses, interestingly it was still yellow last week mostly in the mornings and few times throughout the day.  I have a little mucus this morning and some of it was even yellow, but there is clear mixed in as well.  I took the 5 additional days of antibiotics you said but that ended early last week and I haven't been taking it since.  I don't know if it is just allergies at this point or still related to the virus or what.  I've been ramping up my activity some and noticed my endurance definitely isn't back to where it was yet.  Outside of a little mucus earlier this morning it has been mostly clear since so hopefully that continues."  Discussed with patient to push fluids, continue allergy medications e.g. nasal saline, claritin or bromphed DM.  May use sudafed if only taking claritin for rhinitis not controlled with claritin.  Patient to notify me if mucous cloudy all the way through or bloody or fever/chills.  Patient verbalized understanding information/instructions and had no further questions at this time.

## 2022-04-13 NOTE — Telephone Encounter (Signed)
See tcon dated 04/01/22

## 2022-04-19 NOTE — Telephone Encounter (Signed)
Patient left message for NP  "I am hoping its pretty much done, I am still coughing up phlegm a few times a day.  Over the weekend outside of those few times a day when a large hunk would be produced, I was still having a ticklish throat that was leading to a very dry unproductive cough.  The cough hasn't been there as much today or yesterday though, so I am hoping its finally over."  NP left message for patient keep taking your antihistamine e.g. claritin or bromphed DM due to grass mowing/leaf mulching/burning occurring in community this week with milder weather and 80 degree temps.  Nasal saline also helpful after being outside.  Smoke from fireplaces/fire pits/wood burning furnaces may also be irritating his airways/allergy issues.

## 2022-05-28 ENCOUNTER — Other Ambulatory Visit: Payer: Self-pay | Admitting: Family Medicine

## 2022-05-28 DIAGNOSIS — K2100 Gastro-esophageal reflux disease with esophagitis, without bleeding: Secondary | ICD-10-CM

## 2022-05-30 ENCOUNTER — Other Ambulatory Visit: Payer: Self-pay | Admitting: Family Medicine

## 2022-05-30 ENCOUNTER — Telehealth: Payer: Self-pay

## 2022-05-30 MED ORDER — ALPRAZOLAM 0.5 MG PO TABS: 0.5000 mg | ORAL_TABLET | Freq: Every evening | ORAL | 0 refills | Status: DC | PRN

## 2022-05-30 NOTE — Telephone Encounter (Signed)
PT NEED CPE APPT W/PCP FOR FUTURE REFILLS  

## 2022-05-30 NOTE — Telephone Encounter (Signed)
Pt called requesting a refill on Alprazolam 0.5 mg. Last RF was 12/14/2021. Last OV was 06/02/2021 with Noemi Chapel, NP. Pt uses [facility]. Thank you.

## 2022-06-10 NOTE — Telephone Encounter (Signed)
CPE and fasting labs scheduled for end of January 2024.

## 2022-06-21 ENCOUNTER — Other Ambulatory Visit: Payer: Self-pay | Admitting: Family Medicine

## 2022-06-21 DIAGNOSIS — K2100 Gastro-esophageal reflux disease with esophagitis, without bleeding: Secondary | ICD-10-CM

## 2022-07-05 ENCOUNTER — Encounter: Payer: Self-pay | Admitting: Registered Nurse

## 2022-07-05 ENCOUNTER — Ambulatory Visit: Payer: No Typology Code available for payment source | Admitting: Registered Nurse

## 2022-07-05 VITALS — BP 138/90 | HR 89 | Resp 16 | Ht 73.0 in | Wt 268.0 lb

## 2022-07-05 DIAGNOSIS — I10 Essential (primary) hypertension: Secondary | ICD-10-CM

## 2022-07-05 DIAGNOSIS — Z6835 Body mass index (BMI) 35.0-35.9, adult: Secondary | ICD-10-CM

## 2022-07-05 MED ORDER — AMLODIPINE BESYLATE 5 MG PO TABS: 5.0000 mg | ORAL_TABLET | Freq: Every day | ORAL | 0 refills | Status: DC

## 2022-07-05 NOTE — Progress Notes (Signed)
Subjective:    Patient ID: Bruce Kemp, male    DOB: 04-Jan-1986, 37 y.o.   MRN: [id number]  36y/o married caucasian male established patient here for evaluation elevated blood pressure yesterday at home and dizziness.  Has been weighing self once a week.  January started counting calories.  Typically eating two meals per day.  Bought a house in the past month and was married in the past 6 months.  Typically drinking alcohol to relax prior to bed.  Usual stress at work.  No excessive caffeine.  In paint ball league tournament this weekend.  Exercising weights and aerobics and still working on weight loss has plateau ed.  Denied recent illness or feeling unwell recently.  Drinks zero calorie soda if has caffeine.  Ran out of methylphenidate and viibryd needs to see his provider to get refill.  Seeing his blood pressure high makes him anxious would like to start blood pressure medication until he can lose more weight as [employer] exams due at work and currently will not meet blood pressure requirement 135/85.  He has blood pressure machine at home.  Denied chest pain/palpitations/dyspnea/leg swelling end of day/sock lines ankles or visual changes/worst headache of life in the past week.      Review of Systems  Constitutional:  Negative for activity change, appetite change, diaphoresis, fatigue and fever.  HENT:  Negative for congestion, mouth sores, nosebleeds, postnasal drip, rhinorrhea, sinus pressure, sinus pain, sneezing, sore throat, trouble swallowing and voice change.   Eyes:  Negative for photophobia, pain, discharge, redness, itching and visual disturbance.  Respiratory:  Negative for cough, choking, chest tightness, shortness of breath, wheezing and stridor.   Cardiovascular:  Negative for chest pain and palpitations.  Gastrointestinal:  Negative for diarrhea, nausea and vomiting.  Genitourinary:  Negative for difficulty urinating.  Musculoskeletal:  Negative for arthralgias, back pain,  gait problem, joint swelling, myalgias, neck pain and neck stiffness.  Skin:  Negative for color change and rash.  Neurological:  Positive for dizziness. Negative for tremors, seizures, syncope, facial asymmetry, speech difficulty, weakness, light-headedness, numbness and headaches.  Psychiatric/Behavioral:  Negative for agitation, confusion and sleep disturbance. The patient is not nervous/anxious.        Objective:   Physical Exam Vitals and nursing note reviewed.  Constitutional:      General: He is awake. He is not in acute distress.    Appearance: Normal appearance. He is well-developed and well-groomed. He is obese. He is not ill-appearing, toxic-appearing or diaphoretic.  HENT:     Head: Normocephalic and atraumatic.     Jaw: There is normal jaw occlusion.     Salivary Glands: Right salivary gland is not diffusely enlarged. Left salivary gland is not diffusely enlarged.     Right Ear: Hearing and external ear normal.     Left Ear: Hearing and external ear normal.     Nose: Nose normal. No congestion or rhinorrhea.     Mouth/Throat:     Lips: Pink. No lesions.     Mouth: Mucous membranes are moist.     Pharynx: Oropharynx is clear.  Eyes:     General: Lids are normal. Vision grossly intact. Gaze aligned appropriately. No scleral icterus.       Right eye: No discharge.        Left eye: No discharge.     Extraocular Movements: Extraocular movements intact.     Conjunctiva/sclera: Conjunctivae normal.     Pupils: Pupils are equal, round, and  reactive to light.  Neck:     Trachea: Trachea normal.  Cardiovascular:     Rate and Rhythm: Normal rate and regular rhythm.     Pulses: Normal pulses.     Heart sounds: Normal heart sounds. No murmur heard.    No friction rub. No gallop.  Pulmonary:     Effort: Pulmonary effort is normal.     Breath sounds: Normal breath sounds and air entry. No stridor or transmitted upper airway sounds. No wheezing.     Comments: Spoke full  sentences without difficulty; no cough observed in exam room Abdominal:     General: Abdomen is flat.  Musculoskeletal:        General: Normal range of motion.     Cervical back: Normal range of motion and neck supple. No rigidity or tenderness.     Right lower leg: No edema.     Left lower leg: No edema.  Lymphadenopathy:     Head:     Right side of head: No submandibular or preauricular adenopathy.     Left side of head: No submandibular or preauricular adenopathy.     Cervical: No cervical adenopathy.     Right cervical: No superficial cervical adenopathy.    Left cervical: No superficial cervical adenopathy.  Skin:    General: Skin is warm and dry.     Capillary Refill: Capillary refill takes less than 2 seconds.     Coloration: Skin is not ashen, cyanotic, jaundiced, mottled, pale or sallow.     Findings: No abrasion, bruising, burn, erythema, signs of injury, laceration, petechiae, rash or wound.  Neurological:     General: No focal deficit present.     Mental Status: He is alert and oriented to person, place, and time. Mental status is at baseline.     Motor: Motor function is intact. No weakness, tremor, abnormal muscle tone or seizure activity.     Coordination: Coordination is intact. Coordination normal.     Gait: Gait is intact. Gait normal.     Comments: In/out of chair without difficulty; gait sure and steady in clinic; bilateral hand grasp equal 5/5  Psychiatric:        Attention and Perception: Attention and perception normal.        Mood and Affect: Mood and affect normal.        Speech: Speech normal.        Behavior: Behavior normal. Behavior is cooperative.        Thought Content: Thought content normal.        Cognition and Memory: Cognition and memory normal.        Judgment: Judgment normal.           Assessment & Plan:  A-essential hypertension, obesity  P-start amlodipine '5mg'$  po daily #90 RF0 dispensed from [facility] to patient.  Take 1 now and repeat  blood pressure this afternoon.  Discussed most common side effect some swelling ankles/feet as this medication dilates blood vessels to lower blood pressure.  Did not want to start lisinopril due to possible cough side effect.  Does not have leg swelling typically and not interested in urinating more with hydrochlorothiazide.  Keep blood pressure log at home.  Discussed DASH diet keeping sodium under '2000mg'$  per day.  Uses prepackaged meat seasonings and these typically are high in sodium.   Not eating packaged foods frequently and typically cooking at home.   Discussed currently drinking 4 oz hard liquor prior to bed to help  him relax and sleep and that equals 4 servings decrease to 2 or less per evening.  Each serving of alcohol per day can raise BP 65m Hg and 2 servings or less recommended for his health (liver-cirrhosis/GI/cancer risk increases) and typically alcohol worsens sleep quality/weight gain.  Unnecessary calories will help jump start weight loss also.  Discussed avoid weight lifting on days his blood pressure elevated higher than 160/90 and consider walk/hike/bicycling instead. Discussed if stressful day consider meditation app use also or taking walk to break up stressful activities.  Avoiding caffeine/alcohol/zero calorie sodas with sugar substitute if blood pressure elevated also.  Do not stop all alcohol intake suddenly taper off.  Patient planning to work on decreasing to 2 servings per evening and eventually none on work nights.  Bring BP log to clinic next week for review/check at different times of day.  Discussed if greater than 160/100 may take second dose amlodipine '5mg'$  in the afternoon/evening and to write on his BP log if he does take second dose.  Discussed I do not think he will need '10mg'$  amlodipine if he decreases salt and alcohol intake and continues weight loss efforts.  Patient currently not taking his stimulant for ADHD and that could raise BP further require amlodipine '10mg'$ .   Discussed recent work/home stressors e.g. marriage/purchasing new home could be contributing to elevated blood pressure also.  EAP and [facility] behavior health available if he needs appt to discuss stressors with provider further and management skills. Denied HI/SI or financial/relationship concerns at this time. Due to contact limitations unable to prescribe mental health medications in this clinic.  Patient verbalized understanding information/instructions, agreed with plan of care and had no further questions at this time.

## 2022-07-05 NOTE — Patient Instructions (Signed)
Healthy Living: Alcohol Use You will learn about the possible health benefits and risks of drinking alcohol. To view the content, go to this web address: [email or web address]  This video will expire on: 02/16/2024. If you need access to this video following this date, please reach out to the healthcare provider who assigned it to you. This information is not intended to replace advice given to you by your health care provider. Make sure you discuss any questions you have with your health care provider. Elsevier Patient Education  [place] stands for Dietary Approaches to Stop Hypertension. The DASH eating plan is a healthy eating plan that has been shown to: Reduce high blood pressure (hypertension). Reduce your risk for type 2 diabetes, heart disease, and stroke. Help with weight loss. What are tips for following this plan? Reading food labels Check food labels for the amount of salt (sodium) per serving. Choose foods with less than 5 percent of the Daily Value of sodium. Generally, foods with less than 300 milligrams (mg) of sodium per serving fit into this eating plan. To find whole grains, look for the word "whole" as the first word in the ingredient list. Shopping Buy products labeled as "low-sodium" or "no salt added." Buy fresh foods. Avoid canned foods and pre-made or frozen meals. Cooking Avoid adding salt when cooking. Use salt-free seasonings or herbs instead of table salt or sea salt. Check with your health care provider or pharmacist before using salt substitutes. Do not fry foods. Cook foods using healthy methods such as baking, boiling, grilling, roasting, and broiling instead. Cook with heart-healthy oils, such as olive, canola, avocado, soybean, or sunflower oil. Meal planning  Eat a balanced diet that includes: 4 or more servings of fruits and 4 or more servings of vegetables each day. Try to fill one-half of your plate with  fruits and vegetables. 6-8 servings of whole grains each day. Less than 6 oz (170 g) of lean meat, poultry, or fish each day. A 3-oz (85-g) serving of meat is about the same size as a deck of cards. One egg equals 1 oz (28 g). 2-3 servings of low-fat dairy each day. One serving is 1 cup (237 mL). 1 serving of nuts, seeds, or beans 5 times each week. 2-3 servings of heart-healthy fats. Healthy fats called omega-3 fatty acids are found in foods such as walnuts, flaxseeds, fortified milks, and eggs. These fats are also found in cold-water fish, such as sardines, salmon, and mackerel. Limit how much you eat of: Canned or prepackaged foods. Food that is high in trans fat, such as some fried foods. Food that is high in saturated fat, such as fatty meat. Desserts and other sweets, sugary drinks, and other foods with added sugar. Full-fat dairy products. Do not salt foods before eating. Do not eat more than 4 egg yolks a week. Try to eat at least 2 vegetarian meals a week. Eat more home-cooked food and less restaurant, buffet, and fast food. Lifestyle When eating at a restaurant, ask that your food be prepared with less salt or no salt, if possible. If you drink alcohol: Limit how much you use to: 0-1 drink a day for women who are not pregnant. 0-2 drinks a day for men. Be aware of how much alcohol is in your drink. In the [place] one drink equals one 12 oz bottle of beer (355 mL), one 5 oz glass of wine (148 mL), or one 1 oz glass of hard liquor (  44 mL). General information Avoid eating more than 2,300 mg of salt a day. If you have hypertension, you may need to reduce your sodium intake to 1,500 mg a day. Work with your health care provider to maintain a healthy body weight or to lose weight. Ask what an ideal weight is for you. Get at least 30 minutes of exercise that causes your heart to beat faster (aerobic exercise) most days of the week. Activities may include walking, swimming, or  biking. Work with your health care provider or dietitian to adjust your eating plan to your individual calorie needs. What foods should I eat? Fruits All fresh, dried, or frozen fruit. Canned fruit in natural juice (without added sugar). Vegetables Fresh or frozen vegetables (raw, steamed, roasted, or grilled). Low-sodium or reduced-sodium tomato and vegetable juice. Low-sodium or reduced-sodium tomato sauce and tomato paste. Low-sodium or reduced-sodium canned vegetables. Grains Whole-grain or whole-wheat bread. Whole-grain or whole-wheat pasta. Brown rice. [place]. Bulgur. Whole-grain and low-sodium cereals. Pita bread. Low-fat, low-sodium crackers. Whole-wheat flour tortillas. Meats and other proteins Skinless chicken or [place]. Ground chicken or [place]. Pork with fat trimmed off. Fish and seafood. Egg whites. Dried beans, peas, or lentils. Unsalted nuts, nut butters, and seeds. Unsalted canned beans. Lean cuts of beef with fat trimmed off. Low-sodium, lean precooked or cured meat, such as sausages or meat loaves. Dairy Low-fat (1%) or fat-free (skim) milk. Reduced-fat, low-fat, or fat-free cheeses. Nonfat, low-sodium ricotta or cottage cheese. Low-fat or nonfat yogurt. Low-fat, low-sodium cheese. Fats and oils Soft margarine without trans fats. Vegetable oil. Reduced-fat, low-fat, or light mayonnaise and salad dressings (reduced-sodium). Canola, safflower, olive, avocado, soybean, and sunflower oils. Avocado. Seasonings and condiments Herbs. Spices. Seasoning mixes without salt. Other foods Unsalted popcorn and pretzels. Fat-free sweets. The items listed above may not be a complete list of foods and beverages you can eat. Contact a dietitian for more information. What foods should I avoid? Fruits Canned fruit in a light or heavy syrup. Fried fruit. Fruit in cream or butter sauce. Vegetables Creamed or fried vegetables. Vegetables in a cheese sauce. Regular canned vegetables (not  low-sodium or reduced-sodium). Regular canned tomato sauce and paste (not low-sodium or reduced-sodium). Regular tomato and vegetable juice (not low-sodium or reduced-sodium). Angie Fava. Olives. Grains Baked goods made with fat, such as croissants, muffins, or some breads. Dry pasta or rice meal packs. Meats and other proteins Fatty cuts of meat. Ribs. Fried meat. Berniece Salines. Bologna, salami, and other precooked or cured meats, such as sausages or meat loaves. Fat from the back of a pig (fatback). Bratwurst. Salted nuts and seeds. Canned beans with added salt. Canned or smoked fish. Whole eggs or egg yolks. Chicken or [place] with skin. Dairy Whole or 2% milk, cream, and half-and-half. Whole or full-fat cream cheese. Whole-fat or sweetened yogurt. Full-fat cheese. Nondairy creamers. Whipped toppings. Processed cheese and cheese spreads. Fats and oils Butter. Stick margarine. Lard. Shortening. Ghee. Bacon fat. Tropical oils, such as coconut, palm kernel, or palm oil. Seasonings and condiments Onion salt, garlic salt, seasoned salt, table salt, and sea salt. Worcestershire sauce. Tartar sauce. Barbecue sauce. Teriyaki sauce. Soy sauce, including reduced-sodium. Steak sauce. Canned and packaged gravies. Fish sauce. Oyster sauce. Cocktail sauce. Store-bought horseradish. Ketchup. Mustard. Meat flavorings and tenderizers. Bouillon cubes. Hot sauces. Pre-made or packaged marinades. Pre-made or packaged taco seasonings. Relishes. Regular salad dressings. Other foods Salted popcorn and pretzels. The items listed above may not be a complete list of foods and beverages you should avoid.  Contact a dietitian for more information. Where to find more information National Heart, Lung, and Blood Institute: https://wilson-eaton.com/ American Heart Association: www.heart.org Academy of Nutrition and Dietetics: www.eatright.[place]: www.kidney.org Summary The DASH eating plan is a healthy eating plan that  has been shown to reduce high blood pressure (hypertension). It may also reduce your risk for type 2 diabetes, heart disease, and stroke. When on the DASH eating plan, aim to eat more fresh fruits and vegetables, whole grains, lean proteins, low-fat dairy, and heart-healthy fats. With the DASH eating plan, you should limit salt (sodium) intake to 2,300 mg a day. If you have hypertension, you may need to reduce your sodium intake to 1,500 mg a day. Work with your health care provider or dietitian to adjust your eating plan to your individual calorie needs. This information is not intended to replace advice given to you by your health care provider. Make sure you discuss any questions you have with your health care provider. Document Revised: 05/03/2019 Document Reviewed: 05/03/2019 Elsevier Patient Education  [place] Your Hypertension Hypertension, also called high blood pressure, is when the force of the blood pressing against the walls of the arteries is too strong. Arteries are blood vessels that carry blood from your heart throughout your body. Hypertension forces the heart to work harder to pump blood and may cause the arteries to become narrow or stiff. Understanding blood pressure readings A blood pressure reading includes a higher number over a lower number: The first, or top, number is called the systolic pressure. It is a measure of the pressure in your arteries as your heart beats. The second, or bottom number, is called the diastolic pressure. It is a measure of the pressure in your arteries as the heart relaxes. For most people, a normal blood pressure is below 120/80. Your personal target blood pressure may vary depending on your medical conditions, your age, and other factors. Blood pressure is classified into four stages. Based on your blood pressure reading, your health care provider may use the following stages to determine what type of treatment you need, if any.  Systolic pressure and diastolic pressure are measured in a unit called millimeters of mercury (mmHg). Normal Systolic pressure: below 741. Diastolic pressure: below 80. Elevated Systolic pressure: 287-867. Diastolic pressure: below 80. Hypertension stage 1 Systolic pressure: 672-094. Diastolic pressure: 70-96. Hypertension stage 2 Systolic pressure: 283 or above. Diastolic pressure: 90 or above. How can this condition affect me? Managing your hypertension is very important. Over time, hypertension can damage the arteries and decrease blood flow to parts of the body, including the brain, heart, and kidneys. Having untreated or uncontrolled hypertension can lead to: A heart attack. A stroke. A weakened blood vessel (aneurysm). Heart failure. Kidney damage. Eye damage. Memory and concentration problems. Vascular dementia. What actions can I take to manage this condition? Hypertension can be managed by making lifestyle changes and possibly by taking medicines. Your health care provider will help you make a plan to bring your blood pressure within a normal range. You may be referred for counseling on a healthy diet and physical activity. Nutrition  Eat a diet that is high in fiber and potassium, and low in salt (sodium), added sugar, and fat. An example eating plan is called the DASH diet. DASH stands for Dietary Approaches to Stop Hypertension. To eat this way: Eat plenty of fresh fruits and vegetables. Try to fill one-half of your plate at each meal with fruits and  vegetables. Eat whole grains, such as whole-wheat pasta, brown rice, or whole-grain bread. Fill about one-fourth of your plate with whole grains. Eat low-fat dairy products. Avoid fatty cuts of meat, processed or cured meats, and poultry with skin. Fill about one-fourth of your plate with lean proteins such as fish, chicken without skin, beans, eggs, and tofu. Avoid pre-made and processed foods. These tend to be higher in  sodium, added sugar, and fat. Reduce your daily sodium intake. Many people with hypertension should eat less than 1,500 mg of sodium a day. Lifestyle  Work with your health care provider to maintain a healthy body weight or to lose weight. Ask what an ideal weight is for you. Get at least 30 minutes of exercise that causes your heart to beat faster (aerobic exercise) most days of the week. Activities may include walking, swimming, or biking. Include exercise to strengthen your muscles (resistance exercise), such as weight lifting, as part of your weekly exercise routine. Try to do these types of exercises for 30 minutes at least 3 days a week. Do not use any products that contain nicotine or tobacco. These products include cigarettes, chewing tobacco, and vaping devices, such as e-cigarettes. If you need help quitting, ask your health care provider. Control any long-term (chronic) conditions you have, such as high cholesterol or diabetes. Identify your sources of stress and find ways to manage stress. This may include meditation, deep breathing, or making time for fun activities. Alcohol use Do not drink alcohol if: Your health care provider tells you not to drink. You are pregnant, may be pregnant, or are planning to become pregnant. If you drink alcohol: Limit how much you have to: 0-1 drink a day for women. 0-2 drinks a day for men. Know how much alcohol is in your drink. In the [place]., one drink equals one 12 oz bottle of beer (355 mL), one 5 oz glass of wine (148 mL), or one 1 oz glass of hard liquor (44 mL). Medicines Your health care provider may prescribe medicine if lifestyle changes are not enough to get your blood pressure under control and if: Your systolic blood pressure is 130 or higher. Your diastolic blood pressure is 80 or higher. Take medicines only as told by your health care provider. Follow the directions carefully. Blood pressure medicines must be taken as told by your  health care provider. The medicine does not work as well when you skip doses. Skipping doses also puts you at risk for problems. Monitoring Before you monitor your blood pressure: Do not smoke, drink caffeinated beverages, or exercise within 30 minutes before taking a measurement. Use the bathroom and empty your bladder (urinate). Sit quietly for at least 5 minutes before taking measurements. Monitor your blood pressure at home as told by your health care provider. To do this: Sit with your back straight and supported. Place your feet flat on the floor. Do not cross your legs. Support your arm on a flat surface, such as a table. Make sure your upper arm is at heart level. Each time you measure, take two or three readings one minute apart and record the results. You may also need to have your blood pressure checked regularly by your health care provider. General information Talk with your health care provider about your diet, exercise habits, and other lifestyle factors that may be contributing to hypertension. Review all the medicines you take with your health care provider because there may be side effects or interactions. Keep all follow-up  visits. Your health care provider can help you create and adjust your plan for managing your high blood pressure. Where to find more information National Heart, Lung, and Blood Institute: https://wilson-eaton.com/ American Heart Association: www.heart.org Contact a health care provider if: You think you are having a reaction to medicines you have taken. You have repeated (recurrent) headaches. You feel dizzy. You have swelling in your ankles. You have trouble with your vision. Get help right away if: You develop a severe headache or confusion. You have unusual weakness or numbness, or you feel faint. You have severe pain in your chest or abdomen. You vomit repeatedly. You have trouble breathing. These symptoms may be an emergency. Get help right away.  Call 911. Do not wait to see if the symptoms will go away. Do not drive yourself to the hospital. Summary Hypertension is when the force of blood pumping through your arteries is too strong. If this condition is not controlled, it may put you at risk for serious complications. Your personal target blood pressure may vary depending on your medical conditions, your age, and other factors. For most people, a normal blood pressure is less than 120/80. Hypertension is managed by lifestyle changes, medicines, or both. Lifestyle changes to help manage hypertension include losing weight, eating a healthy, low-sodium diet, exercising more, stopping smoking, and limiting alcohol. This information is not intended to replace advice given to you by your health care provider. Make sure you discuss any questions you have with your health care provider. Document Revised: 02/11/2021 Document Reviewed: 02/11/2021 Elsevier Patient Education  [place].

## 2022-07-07 ENCOUNTER — Encounter: Payer: Self-pay | Admitting: Registered Nurse

## 2022-07-07 ENCOUNTER — Telehealth: Payer: Self-pay | Admitting: Registered Nurse

## 2022-07-07 DIAGNOSIS — I10 Essential (primary) hypertension: Secondary | ICD-10-CM

## 2022-07-07 NOTE — Telephone Encounter (Signed)
07/07/22 0919 140/92 HR 75 repeat 125/88 HR 72 patient reported dizziness and spots in vision have resolved since starting amlodipine

## 2022-07-07 NOTE — Telephone Encounter (Signed)
Patient sent google doc link for review of home BP checks.  Started amlodipine '5mg'$  po daiy 07/05/22 1630 132/86 repeat 138/93 2200 151/92  07/06/22 0909 134/94 HR 67  repeat 132/91 HR 75 1524 138/78 HR 86 2122 134/89 HR 77

## 2022-07-10 NOTE — Telephone Encounter (Signed)
Patient reported he has made dietary changes we discussed in clinic.  Diastolic blood pressure number not responding as much as systolic number after starting amlodipine.  Still having have flashes of dizziness with turning head but quickly resolve and have decreased in frequency/intensity since starting amlodipine. He does feel hot at times during the day and flushing.   He tried afternoon BID dosing of amlodipine yesterday.  Denied fatigue or ankle swelling.  Discussed with patient he can continue BID dosing for '10mg'$  daily.  To continue blood pressure log and decreasing calories (less alcohol) and continuing his weight loss efforts.  Patient goal BP 135/85.  Discussed I will review his BP log when in clinic on 07/12/21 again unless he has concerns and contacts me sooner. Discussed I am unable to view link from work laptop at home but I am able to view when logged on [employer] at clinic.  Discussed patient cleared to participate in paintball tournament this weekend unless having dizziness.  Patient agreed with plan of care and had no further questions at this time.

## 2022-07-12 ENCOUNTER — Encounter: Payer: Self-pay | Admitting: Family Medicine

## 2022-07-12 ENCOUNTER — Ambulatory Visit (INDEPENDENT_AMBULATORY_CARE_PROVIDER_SITE_OTHER): Payer: No Typology Code available for payment source | Admitting: Family Medicine

## 2022-07-12 VITALS — BP 142/98 | HR 92 | Temp 99.0°F | Ht 73.0 in | Wt 285.0 lb

## 2022-07-12 DIAGNOSIS — Z0001 Encounter for general adult medical examination with abnormal findings: Secondary | ICD-10-CM | POA: Diagnosis not present

## 2022-07-12 DIAGNOSIS — Z0000 Encounter for general adult medical examination without abnormal findings: Secondary | ICD-10-CM

## 2022-07-12 DIAGNOSIS — I10 Essential (primary) hypertension: Secondary | ICD-10-CM

## 2022-07-12 MED ORDER — VALSARTAN-HYDROCHLOROTHIAZIDE 80-12.5 MG PO TABS: 1.0000 | ORAL_TABLET | Freq: Every day | ORAL | 3 refills | Status: DC

## 2022-07-12 NOTE — Progress Notes (Signed)
Subjective:    Patient ID: Bruce Kemp, male    DOB: Jan 21, 1986, 37 y.o.   MRN: [id number]  HPI Patient is here today for complete physical exam.  He recently started amlodipine 5 mg daily for hypertension.  He has since uptitrated to 10 mg daily for hypertension but he has seen no change in his blood pressure.  His systolic blood pressure is between 140 and 478 with a diastolic blood pressure between 90 and 100 consistently.  He denies any chest pain shortness of breath or dyspnea on exertion.  He exercises daily.  He has not seen any change in his exercise tolerance.  He has a history of sleep apnea and is compliant with his CPAP.  He has tried to reduce his alcohol consumption and reduce his sodium consumption.  He is also trying to lose weight. Past Medical History:  Diagnosis Date   Anxiety    Asthma    GERD (gastroesophageal reflux disease)    No past surgical history on file. Current Outpatient Medications on File Prior to Visit  Medication Sig Dispense Refill   ALPRAZolam (XANAX) 0.5 MG tablet Take 1 tablet (0.5 mg total) by mouth at bedtime as needed for anxiety. 30 tablet 0   amLODipine (NORVASC) 5 MG tablet Take 1 tablet (5 mg total) by mouth daily. 90 tablet 0   etodolac (LODINE) 500 MG tablet TAKE 1 TABLET BY MOUTH TWICE A DAY 180 tablet 1   pantoprazole (PROTONIX) 40 MG tablet TAKE 1 TABLET BY MOUTH EVERY DAY 90 tablet 1   No current facility-administered medications on file prior to visit.   No Known Allergies Social History   Socioeconomic History   Marital status: Single    Spouse name: Not on file   Number of children: Not on file   Years of education: Not on file   Highest education level: Not on file  Occupational History   Occupation: Engineer, manufacturing: [employer]  Tobacco Use   Smoking status: Some Days    Types: Cigarettes   Smokeless tobacco: Never  Vaping Use   Vaping Use: Never used  Substance and Sexual Activity   Alcohol use:  Yes    Comment: socially   Drug use: No   Sexual activity: Yes    Birth control/protection: Pill  Other Topics Concern   Not on file  Social History Narrative   Not on file   Social Determinants of Health   Financial Resource Strain: Not on file  Food Insecurity: Not on file  Transportation Needs: Not on file  Physical Activity: Not on file  Stress: No Stress Concern Present (06/05/2017)   [place]    Feeling of Stress : Only a little  Social Connections: Not on file  Intimate Partner Violence: Not At Risk (06/05/2017)   Humiliation, Afraid, Rape, and Kick questionnaire    Fear of Current or Ex-Partner: No    Emotionally Abused: No    Physically Abused: No    Sexually Abused: No   Family History  Problem Relation Age of Onset   Diabetes Father    Hypertension Maternal Grandmother    Diabetes Maternal Grandfather    Hypertension Maternal Grandfather    Hypertension Paternal Grandmother    Hypertension Paternal Grandfather    Kidney cancer Neg Hx    Kidney disease Neg Hx    Prostate cancer Neg Hx       Review of  Systems  All other systems reviewed and are negative.      Objective:   Physical Exam Vitals reviewed.  Constitutional:      General: He is not in acute distress.    Appearance: Normal appearance. He is well-developed. He is obese. He is not diaphoretic.  HENT:     Head: Normocephalic and atraumatic.     Right Ear: Tympanic membrane and ear canal normal.     Left Ear: Tympanic membrane and ear canal normal.     Nose: No congestion or rhinorrhea.     Mouth/Throat:     Pharynx: No oropharyngeal exudate or posterior oropharyngeal erythema.  Eyes:     Conjunctiva/sclera: Conjunctivae normal.  Neck:     Thyroid: No thyromegaly.     Vascular: No carotid bruit or JVD.     Trachea: No tracheal deviation.  Cardiovascular:     Rate and Rhythm: Normal rate and regular rhythm.     Pulses:  Normal pulses.     Heart sounds: Normal heart sounds.  Pulmonary:     Effort: Pulmonary effort is normal. No respiratory distress.     Breath sounds: Normal breath sounds. No wheezing, rhonchi or rales.  Abdominal:     General: Bowel sounds are normal. There is no distension.     Palpations: Abdomen is soft.     Tenderness: There is no abdominal tenderness. There is no guarding or rebound.  Musculoskeletal:     Cervical back: Neck supple.     Right lower leg: No edema.     Left lower leg: No edema.  Lymphadenopathy:     Cervical: No cervical adenopathy.  Skin:    Findings: No rash.  Neurological:     General: No focal deficit present.     Mental Status: He is alert and oriented to person, place, and time. Mental status is at baseline.     Motor: No weakness.     Coordination: Coordination is intact. Coordination normal.     Deep Tendon Reflexes: Reflexes normal.  Psychiatric:        Judgment: Judgment normal.           Assessment & Plan:  Benign essential HTN - Plan: CBC with Differential/Platelet, COMPLETE METABOLIC PANEL WITH GFR, Lipid panel  General medical exam Blood pressure is elevated today.  We will discontinue amlodipine because the patient is experiencing swelling and he has not seen the with regards to his blood pressure.  Instead I will replace with valsartan HCTZ 80/12.5 mg 1 p.o. daily.  Recheck blood pressure in 2 weeks.  Meanwhile check CBC CMP and a lipid panel.  The remainder of his physical exam is significant only for elevated BMI.  Patient is already starting a DASH diet, reducing his alcohol intake, and trying to lose weight

## 2022-07-13 ENCOUNTER — Encounter: Payer: Self-pay | Admitting: Family Medicine

## 2022-07-13 LAB — LIPID PANEL
Cholesterol: 232 mg/dL — ABNORMAL HIGH (ref <200)
HDL: 45 mg/dL (ref >=40)
LDL Cholesterol (Calc): 164 mg/dL (calc) — ABNORMAL HIGH
Non-HDL Cholesterol (Calc): 187 mg/dL (calc) — ABNORMAL HIGH (ref <130)
Total CHOL/HDL Ratio: 5.2 (calc) — ABNORMAL HIGH (ref <5.0)
Triglycerides: 109 mg/dL (ref <150)

## 2022-07-13 LAB — CBC WITH DIFFERENTIAL/PLATELET
Absolute Monocytes: 464 cells/uL (ref 200–950)
Basophils Absolute: 22 cells/uL (ref 0–200)
Basophils Relative: 0.4 %
Eosinophils Absolute: 211 cells/uL (ref 15–500)
Eosinophils Relative: 3.9 %
HCT: 47 % (ref 38.5–50.0)
Hemoglobin: 16.5 g/dL (ref 13.2–17.1)
Lymphs Abs: 2209 cells/uL (ref 850–3900)
MCH: 31.4 pg (ref 27.0–33.0)
MCHC: 35.1 g/dL (ref 32.0–36.0)
MCV: 89.5 fL (ref 80.0–100.0)
MPV: 10.7 fL (ref 7.5–12.5)
Monocytes Relative: 8.6 %
Neutro Abs: 2495 cells/uL (ref 1500–7800)
Neutrophils Relative %: 46.2 %
Platelets: 324 10*3/uL (ref 140–400)
RBC: 5.25 10*6/uL (ref 4.20–5.80)
RDW: 12.3 % (ref 11.0–15.0)
Total Lymphocyte: 40.9 %
WBC: 5.4 10*3/uL (ref 3.8–10.8)

## 2022-07-13 LAB — COMPLETE METABOLIC PANEL WITH GFR
AG Ratio: 1.5 (calc) (ref 1.0–2.5)
ALT: 59 U/L — ABNORMAL HIGH (ref 9–46)
AST: 39 U/L (ref 10–40)
Albumin: 4.5 g/dL (ref 3.6–5.1)
Alkaline phosphatase (APISO): 57 U/L (ref 36–130)
BUN: 14 mg/dL (ref 7–25)
CO2: 28 mmol/L (ref 20–32)
Calcium: 9.5 mg/dL (ref 8.6–10.3)
Chloride: 100 mmol/L (ref 98–110)
Creat: 0.99 mg/dL (ref 0.60–1.26)
Globulin: 3.1 g/dL (calc) (ref 1.9–3.7)
Glucose, Bld: 93 mg/dL (ref 65–99)
Potassium: 4.6 mmol/L (ref 3.5–5.3)
Sodium: 137 mmol/L (ref 135–146)
Total Bilirubin: 1.2 mg/dL (ref 0.2–1.2)
Total Protein: 7.6 g/dL (ref 6.1–8.1)
eGFR: 101 mL/min/{1.73_m2} (ref >=60)

## 2022-07-13 NOTE — Telephone Encounter (Signed)
Patient saw [facility] 07/12/22 started on losartan hydrochlorothiazide also as BP still elevated  RN Kimrey notified and scheduling follow up with patient for clinic BP recheck later this week.  Having some mild ankle swelling with amlodipine '10mg'$  po daily

## 2022-07-14 ENCOUNTER — Ambulatory Visit: Payer: No Typology Code available for payment source | Admitting: Occupational Medicine

## 2022-07-14 ENCOUNTER — Other Ambulatory Visit: Payer: Self-pay

## 2022-07-14 VITALS — BP 136/88

## 2022-07-14 DIAGNOSIS — I10 Essential (primary) hypertension: Secondary | ICD-10-CM

## 2022-07-14 MED ORDER — ROSUVASTATIN CALCIUM 10 MG PO TABS: 10.0000 mg | ORAL_TABLET | Freq: Every day | ORAL | 3 refills | Status: DC

## 2022-07-14 NOTE — Progress Notes (Signed)
Educated to make sure legs not crossed and using the right size Bp cuff.

## 2022-07-20 ENCOUNTER — Telehealth: Payer: Self-pay | Admitting: Registered Nurse

## 2022-07-20 ENCOUNTER — Encounter: Payer: Self-pay | Admitting: Registered Nurse

## 2022-07-20 NOTE — Telephone Encounter (Signed)
See tcon dated 07/20/22

## 2022-07-20 NOTE — Telephone Encounter (Signed)
Reviewed patient blood pressure log 07/12/22'@1002'$  144/98 HR 102  repeat 146/97 HR 103 '@1621'$  Repeat 2044 148/86 HR 98 and 137/93 HR 109  07/14/22 1637 135/84 HR 94 07/15/22 1222 146/89 HR 96 repeat 159/81 07/18/22'@1220'$  115/86 07/19/22 123/84 HR 71  Telephone message left for patient to contact clinic staff.  Follow up to see if any side effects from medication e.g. dizziness/fatigue/vision changes/increased urination.  Trending BPs closer to goal on amlodipine/losartan and hydrochlorothiazide.  Patient also started alprazolam prn, hydroxyzine and vilazodone with PCM when losartan/hydrochlorothiazide initiated.  Decreasing alcohol intake (self reported).

## 2022-07-21 ENCOUNTER — Ambulatory Visit: Payer: No Typology Code available for payment source | Admitting: Occupational Medicine

## 2022-07-25 ENCOUNTER — Ambulatory Visit: Payer: No Typology Code available for payment source | Admitting: Occupational Medicine

## 2022-07-25 VITALS — BP 138/88

## 2022-07-25 DIAGNOSIS — I10 Essential (primary) hypertension: Secondary | ICD-10-CM

## 2022-07-25 DIAGNOSIS — E785 Hyperlipidemia, unspecified: Secondary | ICD-10-CM

## 2022-07-25 NOTE — Progress Notes (Signed)
BP check discussed time he takes medication. Medication reconciled. Dicussed the onset, peak and duration of BP medication. Patient reports not taking rovastatin because didn't know why MD ordered.Reviewed labs with him since MD didn't discuss cholesterol levels and why starting him on cholesterol med. Suggest he call PCP in regards to medications for BP and cholesterol if has further questions. Offered dietitian link for free visits declined at this time.

## 2022-08-02 NOTE — Telephone Encounter (Signed)
Patient closed on new house and moving last week.  No new BP home log since 12 Feb.  RN [place] notified to follow up with patient check BP in clinic today as he is working onsite.  Follow up with log again next week.  Some URI symptoms since moving into new home per spouse this week.

## 2022-08-11 ENCOUNTER — Encounter: Payer: Self-pay | Admitting: Registered Nurse

## 2022-08-11 ENCOUNTER — Ambulatory Visit: Payer: No Typology Code available for payment source | Admitting: Registered Nurse

## 2022-08-11 VITALS — BP 124/80 | HR 72 | Temp 98.2°F

## 2022-08-11 DIAGNOSIS — J069 Acute upper respiratory infection, unspecified: Secondary | ICD-10-CM

## 2022-08-11 NOTE — Progress Notes (Signed)
Subjective:    Patient ID: Bruce Kemp, male    DOB: January 09, 1986, 37 y.o.   MRN: [id number]  36y/o married caucasian established male here for evaluation rhinitis and cough. "I feel like when I got RSV this fall and that is concerning me."   I have not covid home tested.  My wife did and she was negative.  We have similar symptoms.  History of seasonal allergies hasn't started spring allergy regimen yet.  Bought and moving to new home past week.  Has been using mucinex DM helping some.  Mucous thinner and easier to get out.  Mostly yellow to clear but sometimes blood tinged.  Denied fever/chills/body aches/n/v/d/wheezing.  Hasn't needed his albuterol inhaler.      Review of Systems  Constitutional:  Positive for activity change and fatigue. Negative for chills, diaphoresis and fever.  HENT:  Positive for congestion, nosebleeds, postnasal drip, rhinorrhea, sore throat and voice change. Negative for dental problem, drooling, ear discharge, ear pain, facial swelling, hearing loss, mouth sores, sinus pressure, sinus pain, sneezing, tinnitus and trouble swallowing.   Eyes:  Negative for photophobia, pain, discharge, redness, itching and visual disturbance.  Respiratory:  Positive for cough. Negative for choking, shortness of breath, wheezing and stridor.   Cardiovascular:  Negative for chest pain and palpitations.  Gastrointestinal:  Negative for diarrhea, nausea and vomiting.  Genitourinary:  Negative for difficulty urinating.  Musculoskeletal:  Negative for gait problem, neck pain and neck stiffness.  Skin:  Negative for rash.  Allergic/Immunologic: Positive for environmental allergies.  Neurological:  Negative for dizziness, tremors, seizures, syncope, facial asymmetry, speech difficulty, weakness, light-headedness, numbness and headaches.  Hematological:  Negative for adenopathy. Does not bruise/bleed easily.  Psychiatric/Behavioral:  Negative for agitation, confusion and sleep disturbance.         Objective:   Physical Exam Vitals and nursing note reviewed.  Constitutional:      General: He is not in acute distress.    Appearance: Normal appearance. He is well-developed and well-groomed. He is obese. He is ill-appearing. He is not toxic-appearing or diaphoretic.  HENT:     Head: Normocephalic and atraumatic. No raccoon eyes, right periorbital erythema or left periorbital erythema.     Jaw: There is normal jaw occlusion. No trismus.     Salivary Glands: Right salivary gland is not diffusely enlarged or tender. Left salivary gland is not diffusely enlarged or tender.     Right Ear: Hearing, ear canal and external ear normal. No decreased hearing noted. No laceration, drainage, swelling or tenderness. A middle ear effusion is present. There is no impacted cerumen. No foreign body. No mastoid tenderness. No PE tube. No hemotympanum. Tympanic membrane is not injected, scarred, perforated, erythematous, retracted or bulging.     Left Ear: Hearing, ear canal and external ear normal. No decreased hearing noted. No laceration, drainage, swelling or tenderness. A middle ear effusion is present. There is no impacted cerumen. No foreign body. No mastoid tenderness. No PE tube. No hemotympanum. Tympanic membrane is erythematous. Tympanic membrane is not injected, scarred, perforated, retracted or bulging.     Ears:     Comments: Air fluid level clear bilateral TMs intact; no debris in auditory canals; left TMs with 2 nummular erythema 2 and 7 oclock    Nose: Mucosal edema, congestion and rhinorrhea present. No nasal deformity, septal deviation or laceration. Rhinorrhea is clear and bloody.     Right Turbinates: Enlarged and swollen. Not pale.     Left Turbinates:  Enlarged and swollen. Not pale.     Right Sinus: No maxillary sinus tenderness or frontal sinus tenderness.     Left Sinus: No maxillary sinus tenderness or frontal sinus tenderness.     Comments: Bilateral nasal turbinates  edema/erythema yellow clear mucous discharge bilateral nares; cobblestoning posterior pharynx; tonsils edema erythema 2+/4 no exudate; voice slightly hoarse; bilateral allergic shiners; nasal sniffing noted in exam room    Mouth/Throat:     Lips: Pink. No lesions.     Mouth: Mucous membranes are moist. Mucous membranes are not pale, not dry and not cyanotic. No lacerations, oral lesions or angioedema.     Dentition: No dental abscesses or gum lesions.     Tongue: No lesions. Tongue does not deviate from midline.     Palate: No mass and lesions.     Pharynx: Uvula midline. Pharyngeal swelling and posterior oropharyngeal erythema present. No oropharyngeal exudate or uvula swelling.     Tonsils: No tonsillar exudate or tonsillar abscesses. 2+ on the right. 2+ on the left.  Eyes:     General: Lids are normal. Vision grossly intact. Gaze aligned appropriately. Allergic shiner present. No scleral icterus.       Right eye: No foreign body, discharge or hordeolum.        Left eye: No foreign body, discharge or hordeolum.     Extraocular Movements: Extraocular movements intact.     Right eye: Normal extraocular motion and no nystagmus.     Left eye: Normal extraocular motion and no nystagmus.     Conjunctiva/sclera: Conjunctivae normal.     Right eye: Right conjunctiva is not injected. No chemosis, exudate or hemorrhage.    Left eye: Left conjunctiva is not injected. No chemosis, exudate or hemorrhage.    Pupils: Pupils are equal, round, and reactive to light. Pupils are equal.     Right eye: Pupil is round and reactive.     Left eye: Pupil is round and reactive.  Neck:     Thyroid: No thyroid mass or thyromegaly.     Trachea: Trachea and phonation normal. No tracheal tenderness or tracheal deviation.  Cardiovascular:     Rate and Rhythm: Normal rate and regular rhythm.     Pulses: Normal pulses.          Radial pulses are 2+ on the right side and 2+ on the left side.     Heart sounds: Normal  heart sounds, S1 normal and S2 normal.  Pulmonary:     Effort: Pulmonary effort is normal. No respiratory distress.     Breath sounds: Normal breath sounds and air entry. No stridor, decreased air movement or transmitted upper airway sounds. No decreased breath sounds, wheezing, rhonchi or rales.     Comments: Spoke full sentences without difficulty; no cough observed in exam room Abdominal:     General: There is no distension.     Palpations: Abdomen is soft.  Musculoskeletal:        General: No tenderness. Normal range of motion.     Right hand: Normal strength. Normal capillary refill.     Left hand: Normal strength. Normal capillary refill.     Cervical back: Normal range of motion and neck supple. No swelling, edema, deformity, erythema, signs of trauma, rigidity, spasms, torticollis, tenderness or crepitus. No pain with movement, spinous process tenderness or muscular tenderness. Normal range of motion.     Thoracic back: No swelling, edema, deformity, signs of trauma, lacerations or spasms. Normal range of  motion.     Right lower leg: No edema.     Left lower leg: No edema.  Lymphadenopathy:     Head:     Right side of head: No submental, submandibular, tonsillar, preauricular, posterior auricular or occipital adenopathy.     Left side of head: No submental, submandibular, tonsillar, preauricular, posterior auricular or occipital adenopathy.     Cervical: No cervical adenopathy.     Right cervical: No superficial, deep or posterior cervical adenopathy.    Left cervical: No superficial, deep or posterior cervical adenopathy.  Skin:    General: Skin is warm and dry.     Capillary Refill: Capillary refill takes less than 2 seconds.     Coloration: Skin is not ashen, cyanotic, jaundiced, mottled, pale or sallow.     Findings: No abrasion, abscess, acne, bruising, burn, ecchymosis, erythema, signs of injury, laceration, lesion, petechiae, rash or wound.     Nails: There is no clubbing.   Neurological:     General: No focal deficit present.     Mental Status: He is alert and oriented to person, place, and time. Mental status is at baseline.     GCS: GCS eye subscore is 4. GCS verbal subscore is 5. GCS motor subscore is 6.     Cranial Nerves: Cranial nerves 2-12 are intact. No cranial nerve deficit, dysarthria or facial asymmetry.     Sensory: No sensory deficit.     Motor: Motor function is intact. No weakness, tremor, atrophy, abnormal muscle tone or seizure activity.     Coordination: Coordination is intact. Coordination normal.     Gait: Gait is intact. Gait normal.     Comments: Gait sure and steady in clinic; in/out of chair and on/off exam table without difficulty; bilateral hand grasp equal  Psychiatric:        Attention and Perception: Attention and perception normal.        Mood and Affect: Mood and affect normal.        Speech: Speech normal.        Behavior: Behavior normal. Behavior is cooperative.        Thought Content: Thought content normal.        Cognition and Memory: Cognition and memory normal.        Judgment: Judgment normal.     Covid test negative and patient returned to [facility] after taking loratadine '10mg'$  po x1 and drinking water.  Discussed blood pressure improving on new medication regimen.  Has not taken home BP since closed on new house as doesn't know where his machine is currently still unpacking.      Assessment & Plan:   A-acute URI with cough  P-May continue mucinex DM if thick mucous.  Stop if runny mucous and restart bromphed DM 46m po QID prn cough/rhinitis he has at home.  Has albuterol inhaler at home 2 puffs po q4h prn wheezing/chest tightness or protracted coughing.  Discussed prednisone taper patient refused.  Discussed antibiotic rx if ear pain/discharge this weekend or sputum becomes opaque brown/foul tasting patient refused will contact NP if worsening over weekend. Discussed if white spots back of throat/tonsils to  notify clinic staff as will treat for strep.  May use otc cough drops per manufacturer's instructions.  Honey 1 tablespoon q4h po prn cough natural cough suppressant.  Increase use of nasal saline 2 sprays each nostril q2h wa especially after working with dusty items at home/unpacking/organizing.  Discussed covid has increased past 2 weeks in  community test today before returning to [employer].  Given 1 free test from [place] govt.  Notify clinic staff if positive.  Increase water intake.  Discussed with patient may be combination of spring allergies and viral illness.  Patient may use normal saline nasal spray 2 sprays each nostril q2h wa as needed. flonase 56mg 1 spray each nostril BID OTC restart.  Patient denied personal or family history of ENT cancer.  OTC antihistamine of choice claritin '10mg'$  po daily given 4 UD from clinic stock today.  Avoid triggers if possible.  Shower prior to bedtime if exposed to triggers.  If allergic dust/dust mites recommend mattress/pillow covers/encasements; washing linens, vacuuming, sweeping, dusting weekly.  Call or return to clinic as needed if these symptoms worsen or fail to improve as anticipated.   Exitcare handout on cough, nonallergic rhinitis, allergic rhinitis  Has not changed out air filters in new home yet bought new ones will install this weekend..  Patient verbalized understanding of instructions, agreed with plan of care and had no further questions at this time.  P2:  Avoidance and hand washing.

## 2022-08-11 NOTE — Progress Notes (Signed)
Covid test negative today

## 2022-08-11 NOTE — Patient Instructions (Signed)
Cough, Adult Coughing is a reflex that clears your throat and airways (respiratory system). It helps heal and protect your lungs. It is normal to cough from time to time. A cough that happens with other symptoms or that lasts a long time may be a sign of a condition that needs treatment. A short-term (acute) cough may only last 2-3 weeks. A long-term (chronic) cough may last 8 or more weeks. Coughing is often caused by: Diseases, such as: An infection of the respiratory system. Asthma or other heart or lung diseases. Gastroesophageal reflux. This is when acid comes back up from the stomach. Breathing in things that irritate your lungs. Allergies. Postnasal drip. This is when mucus runs down the back of your throat. Smoking. Some medicines. Follow these instructions at home: Medicines Take over-the-counter and prescription medicines only as told by your health care provider. Talk with your provider before you take cough medicine (cough suppressants). Eating and drinking Do not drink alcohol. Avoid caffeine. Drink enough fluid to keep your pee (urine) pale yellow. Lifestyle Avoid cigarette smoke. Do not use any products that contain nicotine or tobacco. These products include cigarettes, chewing tobacco, and vaping devices, such as e-cigarettes. If you need help quitting, ask your provider. Avoid things that make you cough. These may include perfumes, candles, cleaning products, or campfire smoke. General instructions  Watch for any changes to your cough. Tell your provider about them. Always cover your mouth when you cough. If the air is dry in your bedroom or home, use a cool mist vaporizer or humidifier. If your cough is worse at night, try to sleep in a semi-upright position. Rest as needed. Contact a health care provider if: You have new symptoms, or your symptoms get worse. You cough up pus. You have a fever that does not go away or a cough that does not get better after 2-3  weeks. You cannot control your cough with medicine, and you are losing sleep. You have pain that gets worse or is not helped with medicine. You lose weight for no clear reason. You have night sweats. Get help right away if: You cough up blood. You have trouble breathing. Your heart is beating very fast. These symptoms may be an emergency. Get help right away. Call 911. Do not wait to see if the symptoms will go away. Do not drive yourself to the hospital. This information is not intended to replace advice given to you by your health care provider. Make sure you discuss any questions you have with your health care provider. Document Revised: 01/28/2022 Document Reviewed: 01/28/2022 Elsevier Patient Education  [place]. Nonallergic Rhinitis Nonallergic rhinitis is inflammation of the mucous membrane inside the nose. The mucous membrane is the tissue that produces mucus. This condition is different from having allergic rhinitis, which is an allergy that affects the nose. Allergic rhinitis occurs when the body's defense system, or immune system, reacts to a substance that a person is allergic to (allergen), such as pollen, pet dander, mold, or dust. Nonallergic rhinitis has many similar symptoms, but it is not caused by allergens. Nonallergic rhinitis can be an acute or chronic problem. This means it can be short-term or long-term. What are the causes? This condition may be caused by many different things. Some common types of nonallergic rhinitis include: Infectious rhinitis. This is usually caused by an infection in the nose, throat, or upper airways (upper respiratory system). Vasomotor rhinitis. This is the most common type. It is caused by too  much blood flow through your nose, and makes your nose swell. It is triggered by strong odors, cold air, stress, drinking alcohol, cigarette smoke, or changes in the weather. Occupational rhinitis. This type is caused by triggers in the  workplace, such as chemicals, dust, animal dander, or air pollution. Hormonal rhinitis, in male teens and adults. This type is caused by an increase in the hormone estrogen and may happen during pregnancy, puberty, or monthly menstrual periods. Hormonal rhinitis gives you fewer symptoms when estrogen levels drop. Drug-induced rhinitis. Several types of medicines can cause this, such as medicines for high blood pressure or heart disease, aspirin, or NSAIDs. Nonallergic rhinitis with eosinophilia syndrome (NARES). This type is caused by having too much eosinophil, a type of white blood cell. Other causes include a reaction to eating hot or spicy foods. This does not usually cause long-term symptoms. In some cases, the cause of nonallergic rhinitis is not known. What increases the risk? You are more likely to develop this condition if: You are 55-56 years of age. You are male. People who are male are twice as likely to have this condition. What are the signs or symptoms? Common symptoms of this condition include: Stuffy nose (nasal congestion). Runny nose. A feeling of mucus dripping down the back of your throat (postnasal drip). Trouble sleeping. Tiredness, or fatigue. Other symptoms include: Sneezing. Coughing. Itchy nose. Bloodshot eyes. How is this diagnosed? This type may be diagnosed based on: Your symptoms and medical history. A physical exam. Allergy testing to rule out allergic rhinitis. You may have skin tests or blood tests. Your health care provider may also take a swab of nasal discharge to look for an increased number of eosinophils. This is done to confirm a diagnosis of NARES. How is this treated? Treatment for this condition depends on the cause. No single treatment works for everyone. Work with your provider to find the best treatment for you. Treatment may include: Avoiding the things that trigger your symptoms. Medicines to relieve congestion, such as: Steroid  nasal spray. There are many types. You may need to try a few to find out which one works best. Best boy medicine. This treats nasal congestion and may be given by mouth or as a nasal spray. These medicines are used only for a short time. Medicines to relieve a runny nose. These may include antihistamine medicines or decongestant nasal sprays. Nasal irrigation. This involves using a salt-water (saline) spray or saline container called a neti pot. Nasal irrigation helps to clear away mucus and keep your nasal passages moist. Surgery to remove part of your mucous membrane. This is done in severe cases if the condition has not improved after 6-12 months of treatment. Follow these instructions at home: Medicines Take or use over-the-counter and prescription medicines only as told by your provider. Do not stop using your medicine even if you start to feel better. Do not take NSAIDs, such as ibuprofen, or medicines that contain aspirin if they make your symptoms worse. Lifestyle Do not drink alcohol if it makes your symptoms worse. Do not use any products that contain nicotine or tobacco. These products include cigarettes, chewing tobacco, and vaping devices, such as e-cigarettes. If you need help quitting, ask your provider. Avoid secondhand smoke. General instructions Avoid triggers that make your symptoms worse. Use nasal irrigation as told by your provider. Sleep with the head of your bed raised. This may reduce nasal congestion when you sleep. Drink enough fluid to keep your pee (urine) pale  yellow. Contact a health care provider if: You have a fever. Your symptoms are getting worse at home. Your symptoms do not lessen with medicine. You develop new symptoms, especially a headache or nosebleed. Get help right away if: You have difficulty breathing. This symptom may be an emergency. Get help right away. Call 911. Do not wait to see if the symptoms will go away. Do not drive yourself to  the hospital. This information is not intended to replace advice given to you by your health care provider. Make sure you discuss any questions you have with your health care provider. Document Revised: 02/01/2022 Document Reviewed: 02/01/2022 Elsevier Patient Education  [place]. Allergic Rhinitis, Adult  Allergic rhinitis is an allergic reaction that affects the mucous membrane inside the nose. The mucous membrane is the tissue that produces mucus. There are two types of allergic rhinitis: Seasonal. This type is also called hay fever and happens only during certain seasons. Perennial. This type can happen at any time of the year. Allergic rhinitis cannot be spread from person to person. This condition can be mild, bad, or very bad. It can develop at any age and may be outgrown. What are the causes? This condition is caused by allergens. These are things that can cause an allergic reaction. Allergens may differ for seasonal allergic rhinitis and perennial allergic rhinitis. Seasonal allergic rhinitis is caused by pollen. Pollen can come from grasses, trees, and weeds. Perennial allergic rhinitis may be caused by: Dust mites. Proteins in a pet's pee (urine), saliva, or dander. Dander is dead skin cells from a pet. Smoke, mold, or car fumes. Remains of or waste from insects such as cockroaches. What increases the risk? You are more likely to develop this condition if you have a family history of allergies or other conditions related to allergies, including: Allergic conjunctivitis. This is irritation and swelling of parts of the eyes and eyelids. Asthma. This condition affects the lungs and makes it hard to breathe. Atopic dermatitis or eczema. This is long term (chronic) irritation and swelling of the skin. Food allergies. What are the signs or symptoms? Symptoms of this condition include: Sneezing or coughing. A stuffy nose (nasal congestion), itchy nose, or nasal  discharge. Itchy eyes and tearing of the eyes. A feeling of mucus dripping down the back of your throat (postnasal drip). This may cause a sore throat. Trouble sleeping. Tiredness. Headache. How is this diagnosed? This condition may be diagnosed with your symptoms, your medical history, and a physical exam. Your health care provider may check for related conditions, such as: Asthma. Pink eye. This is eye swelling and irritation caused by infection (conjunctivitis). Ear infection. Upper respiratory infection. This is an infection in the nose, throat, or upper airways. You may also have tests to find out which allergens cause your symptoms. These may include skin tests or blood tests. How is this treated? There is no cure for this condition, but treatment can help control symptoms. Treatment may include: Taking medicines that block allergy symptoms, such as corticosteroids (anti-inflammatories) and antihistamines. Medicine may be given as a shot, nasal spray, or pill. Avoiding any allergens. Being exposed again and again to tiny amounts of allergens to help you build a defense against allergens (allergenimmunotherapy). This is done if other treatments have not helped. It may include: Allergy shots. These are injected medicines that have small amounts of an allergen in them. Sublingual immunotherapy. This involves taking small doses of a medicine with an allergen in it  under your tongue. If these treatments do not work, your provider may prescribe newer, stronger medicines. Follow these instructions at home: Avoiding allergens Find out what you are allergic to and avoid those allergens. These are some things you can do to help avoid allergens: If you have perennial allergies: Replace carpet with wood, tile, or vinyl flooring. Carpet can trap dander and dust. Do not smoke. Do not allow smoking in your home Change your heating and air conditioning filters at least once a month. If you have  seasonal allergies, take these steps during allergy season: Keep windows closed as much as possible. Plan outdoor activities when pollen counts are lowest. Check pollen counts before you plan outdoor activities When coming indoors, change clothing and shower before sitting on furniture or bedding. If you have a pet in the house that produces allergens: Keep the pet out of the bedroom. Vacuum, sweep, and dust regularly. General instructions Take over-the-counter and prescription medicines only as told by your provider. Drink enough fluid to keep your pee pale yellow. Where to find more information American Academy of Allergy, Asthma & Immunology: aaaai.org Contact a health care provider if: You have a fever. You develop a cough that does not go away. You make high-pitched whistling sounds when you breathe, most often when you breathe out (wheeze). Your symptoms slow you down or stop you from doing your normal activities each day. Get help right away if: You have shortness of breath. This symptom may be an emergency. Get help right away. Call 911. Do not wait to see if the symptoms will go away. Do not drive yourself to the hospital. This information is not intended to replace advice given to you by your health care provider. Make sure you discuss any questions you have with your health care provider. Document Revised: 02/07/2022 Document Reviewed: 02/07/2022 Elsevier Patient Education  [place].

## 2022-08-15 ENCOUNTER — Encounter: Payer: Self-pay | Admitting: Registered Nurse

## 2022-08-15 ENCOUNTER — Telehealth: Payer: Self-pay | Admitting: Registered Nurse

## 2022-08-15 ENCOUNTER — Ambulatory Visit: Payer: No Typology Code available for payment source | Admitting: Occupational Medicine

## 2022-08-15 VITALS — BP 146/86 | HR 99

## 2022-08-15 DIAGNOSIS — I10 Essential (primary) hypertension: Secondary | ICD-10-CM

## 2022-08-15 DIAGNOSIS — J069 Acute upper respiratory infection, unspecified: Secondary | ICD-10-CM

## 2022-08-15 DIAGNOSIS — R051 Acute cough: Secondary | ICD-10-CM

## 2022-08-15 MED ORDER — MONTELUKAST SODIUM 10 MG PO TABS: 10.0000 mg | ORAL_TABLET | Freq: Every day | ORAL | 3 refills | Status: DC

## 2022-08-15 MED ORDER — PROMETHAZINE HCL 6.25 MG/5ML PO SYRP: 6.2500 mg | ORAL_SOLUTION | Freq: Four times a day (QID) | ORAL | 0 refills | Status: DC | PRN

## 2022-08-15 NOTE — Progress Notes (Signed)
Patient reports to having last two days woken from deep sleeping in coughing fits like last time had URI bronchospasm. Tried inhaler help burning in chest but not coughing. Reports Robitussin,Bromine cough meds, and tessalon pearls not working. Lung sounds clear but tight. Reports dry cough.  Asking if anything can take. NP made aware. Doesn't really want prednisone. Hasn't tried promethazine cough meds. Patient thinks he has pulse at home. Educated to check when having coughing fit. Scheduled appt to see Np Thursday. Pt know can use [facility]adoc before or UC. Contact clinic if worsening.

## 2022-08-15 NOTE — Telephone Encounter (Unsigned)
Patient saw RN Kimrey today breath sounds tight but no wheezing per RN Kimrey.  Patient reported dry protracted coughing typically after he puts on cpap and lies down to sleep.  Has adjustable bed and has been  keeping head of bed elevated and not helping.  CPAP has humidification which he is using.  Has never tried singulair or promethazine.  Willing to try electronic Rx sent to his pharmacy of choice singulair '10mg'$  po qhs #30 RF2 and promethazine 6.'25mg'$ /28m po qhs prn cough.  Discussed it will make him drowsy and avoid alcohol when taking this medication.  He stated if these don't work he is planning to schedule with [facility] for tussionex.  Discussed due to contract limitations I am unable to prescribe controlled medications at [facility]  Discussed with patient due to long lasting bronchitis this fall after viral URI and this is similar consider generic singulair trial 30 days especially since spring allergy season worsening as weather improves.  May continue his daily antihistamine in am zyrtec or claritin.  Discussed promethazine is a prn short term medication at bedtime only.  Discussed patient may use 2 puffs albuterol prn protracted coughing/tight chest/wheezing every 4 hours.  Patient using on wake up at lunch and bedtime the past couple of days.  Denied fever/chills/n/v/d.  Cough just waking him out of deep sleep and trouble getting it to stop.  Refused prednisone at this time.  Scheduled appt with me for re-evaluation 18 Aug 2022.  Patient A&Ox3 spoke full sentences without difficulty respirations even and unlabored rare dry short cough audible during 8 minute call. No audible nasal congestion or throat clearing. Patient agreed with plan of care and had no further questions at this time.

## 2022-08-17 NOTE — Telephone Encounter (Signed)
Spoke with spouse stated less coughing waking up patient Monday night did use new Rx.  Unsure if he tried honey prior to bedtime.  Discussed natural cough suppressant 1 tablespoon every 4 hours.  Patient working remote today not onsite at work.  Very harsh dry cough Sunday night per spouse and patient had hard time getting back to sleep breaking coughing spell that night.

## 2022-08-18 ENCOUNTER — Ambulatory Visit: Payer: No Typology Code available for payment source | Admitting: Registered Nurse

## 2022-08-23 NOTE — Telephone Encounter (Signed)
Patient has not found BP cuff since move.  Had check with RN Evlyn Kanner last week in clinic does not have log to show provider at this time.  Has also been sick with viral URI

## 2022-08-23 NOTE — Telephone Encounter (Signed)
See tcon dated 08/15/22

## 2022-08-24 ENCOUNTER — Encounter: Payer: Self-pay | Admitting: Registered Nurse

## 2022-08-24 ENCOUNTER — Other Ambulatory Visit: Payer: Self-pay | Admitting: Registered Nurse

## 2022-08-24 DIAGNOSIS — M461 Sacroiliitis, not elsewhere classified: Secondary | ICD-10-CM

## 2022-08-24 NOTE — Telephone Encounter (Signed)
Pateint requested refill etodolac has been using for chronic SI joint pain working well for him renal and liver function stable last labs Feb 2024.  Refill sent electronically to his pharmacy of choice and patient notified.  Latest Reference Range & Units 07/12/22 11:15  Sodium 135 - 146 mmol/L 137  Potassium 3.5 - 5.3 mmol/L 4.6  Chloride 98 - 110 mmol/L 100  CO2 20 - 32 mmol/L 28  Glucose 65 - 99 mg/dL 93  BUN 7 - 25 mg/dL 14  Creatinine 0.60 - 1.26 mg/dL 0.99  Calcium 8.6 - 10.3 mg/dL 9.5  BUN/Creatinine Ratio 6 - 22 (calc) SEE NOTE:  eGFR > OR = 60 mL/min/1.37m 101  AG Ratio 1.0 - 2.5 (calc) 1.5  AST 10 - 40 U/L 39  ALT 9 - 46 U/L 59 (H)  Total Protein 6.1 - 8.1 g/dL 7.6  Total Bilirubin 0.2 - 1.2 mg/dL 1.2  (H): Data is abnormally high

## 2022-08-24 NOTE — Telephone Encounter (Signed)
Patient left message for NP "Yes symptoms are improving, now when I cough its only when I am producing phlegm.   Sinus production is slowing and post nasal drip is minimal.  Overall energy is improving." Noted in Epic patient filled Rx for tessalon pearles '200mg'$  po TID prn cough and augmentin '875mg'$  po BID on 08/22/2022

## 2022-09-12 ENCOUNTER — Ambulatory Visit: Payer: No Typology Code available for payment source | Admitting: Occupational Medicine

## 2022-09-12 VITALS — BP 140/90

## 2022-09-12 DIAGNOSIS — I10 Essential (primary) hypertension: Secondary | ICD-10-CM

## 2022-09-14 NOTE — Telephone Encounter (Signed)
Patient seen in clinic by RN Kimrey this week.  Not onsite 09/13/22.  BP was elevated 4/1 on RN check but patient had just taken his blood pressure medications 1 hour prior to BP check per RN Evlyn Kanner and he was going to follow up with her again later in week for recheck.  He has not found BP cuff at home since moving residences.

## 2022-09-21 ENCOUNTER — Ambulatory Visit: Payer: No Typology Code available for payment source | Admitting: Occupational Medicine

## 2022-09-22 ENCOUNTER — Ambulatory Visit: Payer: No Typology Code available for payment source | Admitting: Occupational Medicine

## 2022-09-28 NOTE — Telephone Encounter (Signed)
Patient no showed 09/22/22 appt with RN Bess Kinds.  Scheduled follow up with her this week for repeat BP check

## 2022-09-29 ENCOUNTER — Ambulatory Visit: Payer: No Typology Code available for payment source | Admitting: Occupational Medicine

## 2022-09-29 VITALS — BP 133/92

## 2022-09-29 DIAGNOSIS — I10 Essential (primary) hypertension: Secondary | ICD-10-CM

## 2022-09-29 NOTE — Telephone Encounter (Signed)
Patient saw RN Bess Kinds today reported work and home stressors, he is taking his medicine and recently found his home BP cuff and going to restart home BP checks/log.  Discussed with RN Kimrey to verify alcohol intake with patient at next follow up appt as each serving alcohol can increase blood pressure  BP 133/92 Important   (BP Location: Left Arm, Patient Position: Sitting, Cuff Size: Large)

## 2022-10-04 ENCOUNTER — Other Ambulatory Visit: Payer: Self-pay | Admitting: Family Medicine

## 2022-10-04 NOTE — Telephone Encounter (Signed)
Requested Prescriptions  Refused Prescriptions Disp Refills   valsartan-hydrochlorothiazide (DIOVAN-HCT) 80-12.5 MG tablet [Pharmacy Med Name: VALSARTAN-HCTZ 80-12.5 MG TAB] 90 tablet 3    Sig: TAKE 1 TABLET BY MOUTH EVERY DAY     Cardiovascular: ARB + Diuretic Combos Failed - 10/04/2022 11:13 AM      Failed - Last BP in normal range    BP Readings from Last 1 Encounters:  09/29/22 (!) 133/92         Failed - Valid encounter within last 6 months    Recent Outpatient Visits           1 year ago Sore throat   [facility] Valentino Nose, NP   1 year ago Erroneous encounter - disregard   [facility] Valentino Nose, NP   1 year ago Dermoid cyst of lower extremity, left   [facility] Valentino Nose, NP   2 years ago Fever, unspecified fever cause   [facility] Pickard, Priscille Heidelberg, MD   2 years ago Elevated LFTs   [facility] Tanya Nones, Priscille Heidelberg, MD              Passed - K in normal range and within 180 days    Potassium  Date Value Ref Range Status  07/12/2022 4.6 3.5 - 5.3 mmol/L Final         Passed - Na in normal range and within 180 days    Sodium  Date Value Ref Range Status  07/12/2022 137 135 - 146 mmol/L Final  01/28/2022 142 134 - 144 mmol/L Final         Passed - Cr in normal range and within 180 days    Creat  Date Value Ref Range Status  07/12/2022 0.99 0.60 - 1.26 mg/dL Final         Passed - eGFR is 10 or above and within 180 days    GFR calc Af Amer  Date Value Ref Range Status  10/10/2019 >60 >60 mL/min Final   GFR calc non Af Amer  Date Value Ref Range Status  10/10/2019 >60 >60 mL/min Final   eGFR  Date Value Ref Range Status  07/12/2022 101 > OR = 60 mL/min/1.27m2 Final  01/28/2022 97 >59 mL/min/1.73 Final         Passed - Patient is not pregnant

## 2022-11-02 ENCOUNTER — Ambulatory Visit: Payer: No Typology Code available for payment source | Admitting: Occupational Medicine

## 2022-11-02 VITALS — BP 124/88 | HR 88

## 2022-11-02 DIAGNOSIS — I10 Essential (primary) hypertension: Secondary | ICD-10-CM

## 2022-11-02 NOTE — Progress Notes (Signed)
BP doing well going to start logging again after work. Last night 140/80s

## 2022-11-05 NOTE — Telephone Encounter (Signed)
Patient saw RN Bess Kinds 11/02/22 for BP check taking his medications and has found his home BP cuff and starting home checks again last night/keeping log  BP 124/88 (BP Location: Left Arm, Patient Position: Sitting, Cuff Size: Large)   Pulse 88

## 2022-11-16 ENCOUNTER — Other Ambulatory Visit: Payer: Self-pay | Admitting: Registered Nurse

## 2022-11-16 DIAGNOSIS — Z0000 Encounter for general adult medical examination without abnormal findings: Secondary | ICD-10-CM

## 2022-11-16 DIAGNOSIS — R7401 Elevation of levels of liver transaminase levels: Secondary | ICD-10-CM

## 2022-11-24 ENCOUNTER — Other Ambulatory Visit: Payer: No Typology Code available for payment source | Admitting: Occupational Medicine

## 2022-11-24 VITALS — BP 136/90 | HR 89 | Wt 288.8 lb

## 2022-11-24 DIAGNOSIS — Z0000 Encounter for general adult medical examination without abnormal findings: Secondary | ICD-10-CM

## 2022-11-24 DIAGNOSIS — E785 Hyperlipidemia, unspecified: Secondary | ICD-10-CM

## 2022-11-24 DIAGNOSIS — R7401 Elevation of levels of liver transaminase levels: Secondary | ICD-10-CM

## 2022-11-24 NOTE — Progress Notes (Signed)
Lab drawn from Left AC tolerated well no issues noted. BP still elevated encourage him to schedule appt with PCP. Fu next to go over lab and Be well.

## 2022-11-25 ENCOUNTER — Encounter: Payer: Self-pay | Admitting: Registered Nurse

## 2022-11-25 DIAGNOSIS — R7401 Elevation of levels of liver transaminase levels: Secondary | ICD-10-CM | POA: Insufficient documentation

## 2022-11-25 LAB — HEPATIC FUNCTION PANEL
ALT: 55 IU/L — ABNORMAL HIGH (ref 0–44)
AST: 47 IU/L — ABNORMAL HIGH (ref 0–40)
Albumin: 4.6 g/dL (ref 4.1–5.1)
Alkaline Phosphatase: 64 IU/L (ref 44–121)
Bilirubin Total: 0.9 mg/dL (ref 0.0–1.2)
Bilirubin, Direct: 0.24 mg/dL (ref 0.00–0.40)
Total Protein: 7.1 g/dL (ref 6.0–8.5)

## 2022-11-25 LAB — LIPID PANEL
Chol/HDL Ratio: 4.3 ratio (ref 0.0–5.0)
Cholesterol, Total: 179 mg/dL (ref 100–199)
HDL: 42 mg/dL (ref >39)
LDL Chol Calc (NIH): 116 mg/dL — ABNORMAL HIGH (ref 0–99)
Triglycerides: 118 mg/dL (ref 0–149)
VLDL Cholesterol Cal: 21 mg/dL (ref 5–40)

## 2022-11-25 LAB — HEMOGLOBIN A1C
Est. average glucose Bld gHb Est-mCnc: 114 mg/dL
Hgb A1c MFr Bld: 5.6 % (ref 4.8–5.6)

## 2022-11-25 NOTE — Progress Notes (Signed)
My chart message sent to patient.  Need weight recheck in clinic and blood pressure.  Bruce Kemp,  Your cholesterol improved on medication  total 179 previous 248  HDL good cholesterol 42 previous 45 and LDL bad cholesterol 116 previous 170 ratio 4.3 previous 5.5 (lower is better)  Liver enzymes AST elevated worsening 47 previous 39  ALT 55 previous 59  This could be related to weight gain/medications and/or alcohol intake.  Hgba1c 3 month blood sugar 5.6 previous 5.5 (5.7 considered prediabetes)  Your LDL and Hgba1c met requirements for Be Well 2025 insurance discount see RN Bess Kinds to sign your paperwork.  Fatty liver disease, DASH diet/mediterranean diet handouts sent to your my chart.  I recommend decreasing alcohol intake, continue exercise/diet changes to continue weight loss.  Next labs Jan 2025 for Be Well 2026 fasting.  Follow up with PCM due Jan 2025.  Please let us know if you have further questions/concerns.  Sincerely,  Bruce Billet NP-C

## 2022-11-25 NOTE — Addendum Note (Signed)
Addended by: [name] A on: 11/25/2022 12:24 PM   Modules accepted: Orders

## 2022-11-30 ENCOUNTER — Ambulatory Visit: Payer: No Typology Code available for payment source | Admitting: Occupational Medicine

## 2022-11-30 VITALS — BP 118/84 | HR 85

## 2022-11-30 DIAGNOSIS — I10 Essential (primary) hypertension: Secondary | ICD-10-CM

## 2022-11-30 DIAGNOSIS — Z0000 Encounter for general adult medical examination without abnormal findings: Secondary | ICD-10-CM

## 2022-11-30 DIAGNOSIS — E785 Hyperlipidemia, unspecified: Secondary | ICD-10-CM

## 2022-11-30 NOTE — Progress Notes (Signed)
Be well insurance premium discount evaluation: Met  Epic reviewed by RN Kimrey transcribed labs and reviewed with the patient. Scheduled follow up labs.   Tobacco attestation signed. [facility] ROI formed signed. Forms placed in the chart.   Patient given handouts for [facility] pharmacies and discount drugs list, MyChart, [facility], [facility], [place] [facility] and [facility].  What to do for infectious illness protocol. Given handout for list of medications that can be filled at [facility]. Given Clinic hours and Clinic Email.  

## 2022-12-01 ENCOUNTER — Telehealth: Payer: Self-pay

## 2022-12-01 NOTE — Telephone Encounter (Signed)
Pt called in to request a refill of ALPRAZolam (XANAX) 0.5 MG tablet [[id number]]  LOV: 07/12/22 CPE  PHARMACY: [facility] [address]  CB#: [phone]

## 2022-12-02 ENCOUNTER — Other Ambulatory Visit: Payer: Self-pay | Admitting: Family Medicine

## 2022-12-02 MED ORDER — ALPRAZOLAM 0.5 MG PO TABS: 0.5000 mg | ORAL_TABLET | Freq: Every evening | ORAL | 0 refills | Status: DC | PRN

## 2022-12-07 NOTE — Progress Notes (Signed)
Signed patient Be Well provider paperwork 12/01/22 met requirements

## 2023-01-26 ENCOUNTER — Telehealth: Payer: Self-pay | Admitting: Family Medicine

## 2023-01-26 ENCOUNTER — Other Ambulatory Visit: Payer: Self-pay

## 2023-01-26 DIAGNOSIS — K2100 Gastro-esophageal reflux disease with esophagitis, without bleeding: Secondary | ICD-10-CM

## 2023-01-26 MED ORDER — PANTOPRAZOLE SODIUM 40 MG PO TBEC: 40.0000 mg | DELAYED_RELEASE_TABLET | Freq: Every day | ORAL | 0 refills | Status: DC

## 2023-01-26 NOTE — Telephone Encounter (Signed)
Prescription Request  01/26/2023  LOV: 07/12/2022  What is the name of the medication or equipment?   pantoprazole (PROTONIX) 40 MG tablet  **patient only has 4 pills left** **Please update records to list [facility] as patient's permanent pharmacy**  Have you contacted your pharmacy to request a refill? Yes   Which pharmacy would you like this sent to?  [id number] [place], [place] - [address] AT [facility] [address] [facility] [address] [place] [address] Phone: [phone] Fax: [phone]    Patient notified that their request is being sent to the clinical staff for review and that they should receive a response within 2 business days.   Please advise patient at [phone]

## 2023-03-09 ENCOUNTER — Encounter: Payer: Self-pay | Admitting: Registered Nurse

## 2023-03-09 ENCOUNTER — Telehealth: Payer: Self-pay | Admitting: Registered Nurse

## 2023-03-09 DIAGNOSIS — M461 Sacroiliitis, not elsewhere classified: Secondary | ICD-10-CM

## 2023-03-09 MED ORDER — ETODOLAC 500 MG PO TABS: 500.0000 mg | ORAL_TABLET | Freq: Two times a day (BID) | ORAL | 1 refills | Status: DC

## 2023-03-09 NOTE — Telephone Encounter (Signed)
Patient reported medication working well for him would like refill etodolac 500mg  po BID sent to [facility] on [address].   Mildly elevated LFTs on last labs patient with ETOH intake daily discussed to decrease intake and weight loss also  Next labs due Jan 2025  Electronic Rx sent to patient pharmacy of choice #180 RF1.  Patient agreed with plan of care and had no further questions at this time.

## 2023-04-28 ENCOUNTER — Other Ambulatory Visit: Payer: Self-pay | Admitting: Family Medicine

## 2023-04-28 DIAGNOSIS — K2100 Gastro-esophageal reflux disease with esophagitis, without bleeding: Secondary | ICD-10-CM

## 2023-04-28 NOTE — Telephone Encounter (Signed)
Requested medication (s) are due for refill today: yes  Requested medication (s) are on the active medication list: yes  Last refill:  01/26/23  Future visit scheduled: no  Notes to clinic:  Unable to refill per protocol, courtesy refill already given, routing for provider approval.      Requested Prescriptions  Pending Prescriptions Disp Refills   pantoprazole (PROTONIX) 40 MG tablet [Pharmacy Med Name: PANTOPRAZOLE 40MG  TABLETS] 90 tablet 0    Sig: TAKE 1 TABLET(40 MG) BY MOUTH DAILY     Gastroenterology: Proton Pump Inhibitors Failed - 04/28/2023 10:24 AM      Failed - Valid encounter within last 12 months    Recent Outpatient Visits           1 year ago Sore throat   [facility] Valentino Nose, NP   1 year ago Erroneous encounter - disregard   [facility] Valentino Nose, NP   2 years ago Dermoid cyst of lower extremity, left   [facility] Valentino Nose, NP   2 years ago Fever, unspecified fever cause   [facility] Pickard, Priscille Heidelberg, MD   3 years ago Elevated LFTs   [facility] Pickard, Priscille Heidelberg, MD

## 2023-05-04 ENCOUNTER — Other Ambulatory Visit: Payer: Self-pay | Admitting: Family Medicine

## 2023-05-04 ENCOUNTER — Other Ambulatory Visit: Payer: Self-pay

## 2023-05-04 ENCOUNTER — Telehealth: Payer: Self-pay

## 2023-05-04 DIAGNOSIS — K2100 Gastro-esophageal reflux disease with esophagitis, without bleeding: Secondary | ICD-10-CM

## 2023-05-04 MED ORDER — PANTOPRAZOLE SODIUM 40 MG PO TBEC: 40.0000 mg | DELAYED_RELEASE_TABLET | Freq: Every day | ORAL | 0 refills | Status: DC

## 2023-05-04 MED ORDER — ALPRAZOLAM 0.5 MG PO TABS: 0.5000 mg | ORAL_TABLET | Freq: Every evening | ORAL | 0 refills | Status: DC | PRN

## 2023-05-04 NOTE — Telephone Encounter (Signed)
RF pantoprazole, sending to you for alprazolam.    Copied from CRM [id number]. Topic: Clinical - Medication Refill >> May 04, 2023 10:13 AM Claudine Mouton wrote: Most Recent Primary Care Visit:  Provider: Lynnea Ferrier T  Department: [facility]  Visit Type: PHYSICAL  Date: 07/12/2022  Medication: pantoprazole 40mg  1x alprazolam 0.5mg  1x   Has the patient contacted their pharmacy? Yes (Agent: If no, request that the patient contact the pharmacy for the refill. If patient does not wish to contact the pharmacy document the reason why and proceed with request.) (Agent: If yes, when and what did the pharmacy advise?) Patient was directed back to office.   Is this the correct pharmacy for this prescription? Yes If no, delete pharmacy and type the correct one.  This is the patient's preferred pharmacy:  [facility] #[id number] Ginette Otto, [place] - [address] AT [facility] OF [address] & [facility] [address] [place] [address] Phone: [phone] Fax: [phone]   Has the prescription been filled recently? No  Is the patient out of the medication? Just one.   Has the patient been seen for an appointment in the last year OR does the patient have an upcoming appointment? Yes  Can we respond through MyChart? Yes  Agent: Please be advised that Rx refills may take up to 3 business days. We ask that you follow-up with your pharmacy.

## 2023-05-05 NOTE — Telephone Encounter (Signed)
Duplicate request. Courtesy refill given yesterday Requested Prescriptions  Pending Prescriptions Disp Refills   pantoprazole (PROTONIX) 40 MG tablet [Pharmacy Med Name: PANTOPRAZOLE 40MG  TABLETS] 90 tablet     Sig: TAKE 1 TABLET(40 MG) BY MOUTH DAILY     Gastroenterology: Proton Pump Inhibitors Failed - 05/04/2023 10:25 AM      Failed - Valid encounter within last 12 months    Recent Outpatient Visits           1 year ago Sore throat   [facility] Valentino Nose, NP   1 year ago Erroneous encounter - disregard   [facility] Valentino Nose, NP   2 years ago Dermoid cyst of lower extremity, left   [facility] Medicine Valentino Nose, NP   2 years ago Fever, unspecified fever cause   [facility] Medicine Pickard, Priscille Heidelberg, MD   3 years ago Elevated LFTs   [facility] Pickard, Priscille Heidelberg, MD

## 2023-05-30 ENCOUNTER — Other Ambulatory Visit: Payer: Self-pay | Admitting: Family Medicine

## 2023-05-30 DIAGNOSIS — K2100 Gastro-esophageal reflux disease with esophagitis, without bleeding: Secondary | ICD-10-CM

## 2023-06-15 ENCOUNTER — Encounter: Payer: Self-pay | Admitting: Registered Nurse

## 2023-06-15 ENCOUNTER — Telehealth: Payer: Self-pay | Admitting: Registered Nurse

## 2023-06-15 DIAGNOSIS — R110 Nausea: Secondary | ICD-10-CM

## 2023-06-15 DIAGNOSIS — R197 Diarrhea, unspecified: Secondary | ICD-10-CM

## 2023-06-15 MED ORDER — OSELTAMIVIR PHOSPHATE 75 MG PO CAPS: 75.0000 mg | ORAL_CAPSULE | Freq: Two times a day (BID) | ORAL | 0 refills | Status: AC

## 2023-06-16 NOTE — Telephone Encounter (Signed)
 Patient had diarrhea overnight some blood in stool per spouse.  Patient concerned his chicken may not have been fully cooked that they ate at home.  Spouse is not having any symptoms and stated her chicken was fully cooked.  Discussed if more than 9 bouts diarrhea per day, worsening bleeding, unable to tolerate po intake will need to be seen by provider and consider antibiotics.  Up to date does not recommend antibiotics for salmonella typically only decreases symptoms by one day if immunocompentent adult patient and risk of c. Diff with antibiotic use.  Discussed clear liquids low sugar gatorade/powerade; pedialyte and clear soup broth recommended today.  Avoid large portions meat/dairy, spicy, fried foods until stomach back to normal.  Exitcare handouts on diarrhea and foods to relieve diarrhea

## 2023-06-20 MED ORDER — COVID-19 ANTIGEN TEST VI KIT: 1.0000 | PACK | Freq: Every day | 1 refills | Status: AC | PRN

## 2023-06-20 NOTE — Telephone Encounter (Signed)
 Patient seen at urgent care this weekend and stool samples labs completed and started on antibiotic given work excuse through today expected to be onsite tomorrow.  Still having some diarrhea and tolerating po intake/urinating.

## 2023-06-22 NOTE — Telephone Encounter (Signed)
 Spoke with patient via telephone stated talked with supervisor and ended up working remote this week.  Still having diarrhea but slowed down.  Still waiting on stool culture results from his UC appt was told may take up to 2 weeks.  Patient stated tolerating po intake without difficulty.  Was told urinalysis and bmet normal essentially except liver enzymes slightly elevated.  Patient plans to be onsite next week.  Discussed continue hydrating and replacing electrolytes.  Patient A&Ox3 spoke full sentences without difficulty stated feeling much better denied questions or concerns.

## 2023-06-27 ENCOUNTER — Other Ambulatory Visit: Payer: Self-pay | Admitting: Family Medicine

## 2023-06-27 DIAGNOSIS — K2100 Gastro-esophageal reflux disease with esophagitis, without bleeding: Secondary | ICD-10-CM

## 2023-06-28 NOTE — Telephone Encounter (Signed)
 Requested medication (s) are due for refill today: Yes  Requested medication (s) are on the active medication list: Yes  Last refill:  05/30/23 #30, 0RF  Future visit scheduled: No  Notes to clinic:  Unable to refill per protocol, courtesy refill already given, routing for provider approval.      Requested Prescriptions  Pending Prescriptions Disp Refills   pantoprazole  (PROTONIX ) 40 MG tablet [Pharmacy Med Name: PANTOPRAZOLE  40MG  TABLETS] 30 tablet 0    Sig: TAKE 1 TABLET(40 MG) BY MOUTH DAILY     Gastroenterology: Proton Pump Inhibitors Failed - 06/28/2023 10:37 AM      Failed - Valid encounter within last 12 months    Recent Outpatient Visits           2 years ago Sore throat   [facility] Wilhemena Harbour, NP   2 years ago Erroneous encounter - disregard   [facility] Wilhemena Harbour, NP   2 years ago Dermoid cyst of lower extremity, left   [facility] Wilhemena Harbour, NP   3 years ago Fever, unspecified fever cause   [facility] Medicine Pickard, Cisco Crest, MD   3 years ago Elevated LFTs   [facility] Pickard, Cisco Crest, MD

## 2023-07-03 ENCOUNTER — Other Ambulatory Visit: Payer: Self-pay | Admitting: Family Medicine

## 2023-07-03 DIAGNOSIS — K2100 Gastro-esophageal reflux disease with esophagitis, without bleeding: Secondary | ICD-10-CM

## 2023-07-03 NOTE — Telephone Encounter (Signed)
Copied from CRM [id number]. Topic: Clinical - Medication Refill >> Jul 03, 2023  4:43 PM Hector Shade B wrote: Most Recent Primary Care Visit:  Provider: Lynnea Ferrier T  Department: [facility]  Visit Type: PHYSICAL  Date: 07/12/2022  Medication:  pantoprazole (PROTONIX) 40 MG table   Has the patient contacted their pharmacy? Yes (Agent: If no, request that the patient contact the pharmacy for the refill. If patient does not wish to contact the pharmacy document the reason why and proceed with request.)Said that approval was needed (Agent: If yes, when and what did the pharmacy advise?)  Is this the correct pharmacy for this prescription? Yes If no, delete pharmacy and type the correct one.  This is the patient's preferred pharmacy:  [facility] #[id number] Ginette Otto, [place] - [address] AT [facility] OF [address] & [facility] [address] [place] [address] Phone: [phone] Fax: [phone]   Has the prescription been filled recently? Yes  Is the patient out of the medication? Yes  Has the patient been seen for an appointment in the last year OR does the patient have an upcoming appointment? Yes  Can we respond through MyChart? Yes  Agent: Please be advised that Rx refills may take up to 3 business days. We ask that you follow-up with your pharmacy.

## 2023-07-04 ENCOUNTER — Other Ambulatory Visit: Payer: Self-pay | Admitting: Family Medicine

## 2023-07-04 DIAGNOSIS — K2100 Gastro-esophageal reflux disease with esophagitis, without bleeding: Secondary | ICD-10-CM

## 2023-07-04 MED ORDER — PANTOPRAZOLE SODIUM 40 MG PO TBEC: 40.0000 mg | DELAYED_RELEASE_TABLET | Freq: Every day | ORAL | 0 refills | Status: DC

## 2023-07-04 NOTE — Telephone Encounter (Signed)
Unable to refill per protocol, Rx request was refilled 07/04/23.  Requested Prescriptions  Pending Prescriptions Disp Refills   pantoprazole (PROTONIX) 40 MG tablet [Pharmacy Med Name: PANTOPRAZOLE 40MG  TABLETS] 90 tablet     Sig: TAKE 1 TABLET(40 MG) BY MOUTH DAILY     Gastroenterology: Proton Pump Inhibitors Failed - 07/04/2023  8:30 AM      Failed - Valid encounter within last 12 months    Recent Outpatient Visits           2 years ago Sore throat   [facility] Valentino Nose, NP   2 years ago Erroneous encounter - disregard   [facility] Valentino Nose, NP   2 years ago Dermoid cyst of lower extremity, left   [facility] Valentino Nose, NP   3 years ago Fever, unspecified fever cause   [facility] Pickard, Priscille Heidelberg, MD   3 years ago Elevated LFTs   [facility] Pickard, Priscille Heidelberg, MD

## 2023-07-04 NOTE — Telephone Encounter (Signed)
Patient diarrhea resolved but still waiting on stool culture test results from urgent care.  Completed course of antibiotics he was given from urgent care.

## 2023-07-05 ENCOUNTER — Other Ambulatory Visit: Payer: Self-pay | Admitting: Family Medicine

## 2023-07-05 NOTE — Telephone Encounter (Signed)
Requested Prescriptions  Pending Prescriptions Disp Refills   valsartan-hydrochlorothiazide (DIOVAN-HCT) 80-12.5 MG tablet [Pharmacy Med Name: VALSARTAN/HCTZ 80MG /12.5MG  TABLETS] 90 tablet 0    Sig: TAKE 1 TABLET BY MOUTH EVERY DAY     Cardiovascular: ARB + Diuretic Combos Failed - 07/05/2023  9:59 AM      Failed - K in normal range and within 180 days    Potassium  Date Value Ref Range Status  07/12/2022 4.6 3.5 - 5.3 mmol/L Final         Failed - Na in normal range and within 180 days    Sodium  Date Value Ref Range Status  07/12/2022 137 135 - 146 mmol/L Final  01/28/2022 142 134 - 144 mmol/L Final         Failed - Cr in normal range and within 180 days    Creat  Date Value Ref Range Status  07/12/2022 0.99 0.60 - 1.26 mg/dL Final         Failed - eGFR is 10 or above and within 180 days    GFR calc Af Amer  Date Value Ref Range Status  10/10/2019 >60 >60 mL/min Final   GFR calc non Af Amer  Date Value Ref Range Status  10/10/2019 >60 >60 mL/min Final   eGFR  Date Value Ref Range Status  07/12/2022 101 > OR = 60 mL/min/1.23m2 Final  01/28/2022 97 >59 mL/min/1.73 Final         Failed - Valid encounter within last 6 months    Recent Outpatient Visits           2 years ago Sore throat   [facility] Valentino Nose, NP   2 years ago Erroneous encounter - disregard   [facility] Valentino Nose, NP   2 years ago Dermoid cyst of lower extremity, left   [facility] Valentino Nose, NP   3 years ago Fever, unspecified fever cause   [facility] Tanya Nones, Priscille Heidelberg, MD   3 years ago Elevated LFTs   [facility] Donita Brooks, MD              Passed - Patient is not pregnant      Passed - Last BP in normal range    BP Readings from Last 1 Encounters:  11/30/22 118/84

## 2023-07-10 ENCOUNTER — Ambulatory Visit: Payer: No Typology Code available for payment source | Admitting: Family Medicine

## 2023-07-10 ENCOUNTER — Encounter: Payer: Self-pay | Admitting: Family Medicine

## 2023-07-10 VITALS — BP 132/86 | HR 102 | Temp 97.8°F | Ht 73.0 in | Wt 309.6 lb

## 2023-07-10 DIAGNOSIS — Z6841 Body Mass Index (BMI) 40.0 and over, adult: Secondary | ICD-10-CM | POA: Diagnosis not present

## 2023-07-10 DIAGNOSIS — E66813 Obesity, class 3: Secondary | ICD-10-CM

## 2023-07-10 DIAGNOSIS — G4700 Insomnia, unspecified: Secondary | ICD-10-CM

## 2023-07-10 MED ORDER — WEGOVY 0.5 MG/0.5ML ~~LOC~~ SOAJ: 0.5000 mg | SUBCUTANEOUS | 1 refills | Status: DC

## 2023-07-10 MED ORDER — TRAZODONE HCL 50 MG PO TABS: 50.0000 mg | ORAL_TABLET | Freq: Every evening | ORAL | 5 refills | Status: DC | PRN

## 2023-07-10 NOTE — Progress Notes (Signed)
Subjective:    Patient ID: Bruce Kemp, male    DOB: 1985/08/02, 38 y.o.   MRN: [id number]  Insomnia   Patient is battling insomnia.  He states that he was originally drinking alcohol at night to help him sleep.  He realizes that this is not healthy and therefore he stopped this behavior.  He does not want to take Xanax.  30 Xanax will typically last him 6 months.  He is afraid of becoming dependent on these type medication however he has tried melatonin.  He is tried exercising.  He is tried decreasing his screen time and caffeine.  He is try conservative options to help him go to sleep and he has been unable.  He also wants assistance with weight loss.  He states that he is exercising 30 minutes a day and he is trying to reduce his carbohydrates in his diet but has been unsuccessful at present.  His BMI is over 40 and he has hypertension and hyperlipidemia. Past Medical History:  Diagnosis Date   Anxiety    Asthma    GERD (gastroesophageal reflux disease)    No past surgical history on file. Current Outpatient Medications on File Prior to Visit  Medication Sig Dispense Refill   ALPRAZolam (XANAX) 0.5 MG tablet Take 1 tablet (0.5 mg total) by mouth at bedtime as needed for anxiety. 30 tablet 0   cetirizine (ZYRTEC) 10 MG tablet Take 10 mg by mouth daily.     etodolac (LODINE) 500 MG tablet Take 1 tablet (500 mg total) by mouth 2 (two) times daily. 180 tablet 1   methylphenidate (METADATE CD) 20 MG CR capsule Take 20 mg by mouth every morning.     pantoprazole (PROTONIX) 40 MG tablet Take 1 tablet (40 mg total) by mouth daily. 30 tablet 0   rosuvastatin (CRESTOR) 10 MG tablet Take 1 tablet (10 mg total) by mouth daily. 90 tablet 3   valsartan-hydrochlorothiazide (DIOVAN-HCT) 80-12.5 MG tablet TAKE 1 TABLET BY MOUTH EVERY DAY 90 tablet 0   Vilazodone HCl (VIIBRYD) 40 MG TABS Take 40 mg by mouth daily.     amLODipine (NORVASC) 5 MG tablet Take 1 tablet (5 mg total) by mouth daily. (Patient  not taking: Reported on 07/25/2022) 90 tablet 0   amoxicillin-clavulanate (AUGMENTIN) 875-125 MG tablet Take 1 tablet by mouth 2 (two) times daily. (Patient not taking: Reported on 07/10/2023)     benzonatate (TESSALON) 200 MG capsule Take 200 mg by mouth 3 (three) times daily as needed. (Patient not taking: Reported on 07/10/2023)     COVID-19 Antigen Test KIT Place 1 Device into the nose daily as needed (symptoms or close contact exposure). (Patient not taking: Reported on 07/10/2023) 4 kit 1   montelukast (SINGULAIR) 10 MG tablet Take 1 tablet (10 mg total) by mouth at bedtime. (Patient not taking: Reported on 07/10/2023) 30 tablet 3   promethazine (PHENERGAN) 6.25 MG/5ML syrup Take 5 mLs (6.25 mg total) by mouth every 6 (six) hours as needed for up to 20 days for nausea or vomiting. 120 mL 0   No current facility-administered medications on file prior to visit.   No Known Allergies Social History   Socioeconomic History   Marital status: Single    Spouse name: Not on file   Number of children: Not on file   Years of education: Not on file   Highest education level: Not on file  Occupational History   Occupation: Engineer, civil (consulting): [employer]  Tobacco Use   Smoking status: Some Days    Types: Cigarettes   Smokeless tobacco: Never  Vaping Use   Vaping status: Never Used  Substance and Sexual Activity   Alcohol use: Yes    Comment: socially   Drug use: No   Sexual activity: Yes    Birth control/protection: Pill  Other Topics Concern   Not on file  Social History Narrative   Not on file   Social Drivers of Health   Financial Resource Strain: Not on file  Food Insecurity: Not on file  Transportation Needs: Not on file  Physical Activity: Not on file  Stress: No Stress Concern Present (06/05/2017)   [employer] of Occupational Health - Occupational Stress Questionnaire    Feeling of Stress : Only a little  Social Connections: Not on file  Intimate  Partner Violence: Not At Risk (06/05/2017)   Humiliation, Afraid, Rape, and Kick questionnaire    Fear of Current or Ex-Partner: No    Emotionally Abused: No    Physically Abused: No    Sexually Abused: No   Family History  Problem Relation Age of Onset   Diabetes Father    Hypertension Maternal Grandmother    Diabetes Maternal Grandfather    Hypertension Maternal Grandfather    Hypertension Paternal Grandmother    Hypertension Paternal Grandfather    Kidney cancer Neg Hx    Kidney disease Neg Hx    Prostate cancer Neg Hx       Review of Systems  Psychiatric/Behavioral:  The patient has insomnia.   All other systems reviewed and are negative.      Objective:   Physical Exam Vitals reviewed.  Constitutional:      General: He is not in acute distress.    Appearance: Normal appearance. He is well-developed. He is obese. He is not diaphoretic.  HENT:     Head: Normocephalic and atraumatic.     Right Ear: Tympanic membrane and ear canal normal.     Left Ear: Tympanic membrane and ear canal normal.     Nose: No congestion or rhinorrhea.     Mouth/Throat:     Pharynx: No oropharyngeal exudate or posterior oropharyngeal erythema.  Eyes:     Conjunctiva/sclera: Conjunctivae normal.  Neck:     Thyroid: No thyromegaly.     Vascular: No carotid bruit or JVD.     Trachea: No tracheal deviation.  Cardiovascular:     Rate and Rhythm: Normal rate and regular rhythm.     Pulses: Normal pulses.     Heart sounds: Normal heart sounds.  Pulmonary:     Effort: Pulmonary effort is normal. No respiratory distress.     Breath sounds: Normal breath sounds. No wheezing, rhonchi or rales.  Abdominal:     General: Bowel sounds are normal. There is no distension.     Palpations: Abdomen is soft.     Tenderness: There is no abdominal tenderness. There is no guarding or rebound.  Musculoskeletal:     Cervical back: Neck supple.     Right lower leg: No edema.     Left lower leg: No edema.   Lymphadenopathy:     Cervical: No cervical adenopathy.  Skin:    Findings: No rash.  Neurological:     General: No focal deficit present.     Mental Status: He is alert and oriented to person, place, and time. Mental status is at baseline.     Motor: No weakness.  Coordination: Coordination is intact. Coordination normal.     Deep Tendon Reflexes: Reflexes normal.  Psychiatric:        Judgment: Judgment normal.           Assessment & Plan:  Insomnia, unspecified type  Class 3 severe obesity with serious comorbidity and body mass index (BMI) of 40.0 to 44.9 in adult, unspecified obesity type (HCC) Recommended trying trazodone 50 mg p.o. nightly as needed insomnia.  Also discussed lifestyle changes to help with insomnia.  Try Wegovy 0.5 mg subcu weekly to assist in weight loss along with diet and exercise.

## 2023-07-17 ENCOUNTER — Encounter: Payer: Self-pay | Admitting: Family Medicine

## 2023-07-18 ENCOUNTER — Other Ambulatory Visit: Payer: Self-pay | Admitting: Family Medicine

## 2023-07-18 MED ORDER — TIRZEPATIDE-WEIGHT MANAGEMENT 2.5 MG/0.5ML ~~LOC~~ SOLN: 2.5000 mg | SUBCUTANEOUS | 1 refills | Status: DC

## 2023-07-25 ENCOUNTER — Encounter: Payer: Self-pay | Admitting: Registered Nurse

## 2023-07-25 ENCOUNTER — Ambulatory Visit: Payer: No Typology Code available for payment source | Admitting: Registered Nurse

## 2023-07-25 VITALS — Temp 98.2°F | Resp 16

## 2023-07-25 DIAGNOSIS — J301 Allergic rhinitis due to pollen: Secondary | ICD-10-CM

## 2023-07-25 MED ORDER — LORATADINE 10 MG PO TABS
10.0000 mg | ORAL_TABLET | Freq: Every day | ORAL | 3 refills | Status: AC
Start: 2023-07-25 — End: ?

## 2023-07-25 MED ORDER — CARBOXYMETHYLCELLULOSE SOD PF 0.5 % OP SOLN: 1.0000 [drp] | Freq: Three times a day (TID) | OPHTHALMIC | Status: AC | PRN

## 2023-07-25 NOTE — Progress Notes (Signed)
Subjective:    Patient ID: Bruce Kemp, male    DOB: Sep 29, 1985, 38 y.o.   MRN: [id number]  37y/o married caucasian male was carrying in groceries and bag of dog food Saturday 8 Feb and shifted dog food bag and grocery bag corner hit left eye.  Had immediate pain and some blurring of vision denied bleeding/headache/n/v/f/c.  Went to bed early Saturday as eye felt better closed.  Woke up and pain was improved a great deal. Vision clear.  Here for evaluation to ensure he doesn't need medication or recommendation to see eye doctor.  Also needs refill of his claritin used up the last of his tabs sent to his pharmacy Rx expired.  Working better for him than zyrtec.  Tried to stop use after spring season started but post nasal drip would wake him up at night with cough so used spring/summer/fall and winter this year.  Stopped smoking last year and has cut down on his alcohol intake.  Exercising and trying to lose weight.  Taking his blood pressure medication daily.  Hasn't been taking his methylphenidate and has appt scheduled with specialist.  Working on computer without difficulty yesterday and today      Review of Systems  Constitutional:  Negative for chills, diaphoresis and fever.  HENT:  Positive for congestion and postnasal drip. Negative for facial swelling, mouth sores, nosebleeds, rhinorrhea, sinus pressure, sinus pain, sore throat, trouble swallowing and voice change.   Eyes:  Positive for pain. Negative for photophobia, discharge, itching and visual disturbance.  Respiratory:  Negative for cough, shortness of breath, wheezing and stridor.   Cardiovascular:  Negative for chest pain.  Gastrointestinal:  Negative for nausea and vomiting.  Genitourinary:  Negative for difficulty urinating.  Musculoskeletal:  Negative for gait problem, myalgias, neck pain and neck stiffness.  Skin:  Negative for color change, pallor, rash and wound.  Neurological:  Negative for dizziness, tremors, seizures,  syncope, facial asymmetry, speech difficulty, weakness, light-headedness, numbness and headaches.  Psychiatric/Behavioral:  Negative for agitation, confusion and sleep disturbance.        Objective:   Physical Exam Vitals reviewed.  Constitutional:      General: He is awake. He is not in acute distress.    Appearance: Normal appearance. He is well-developed, well-groomed and overweight. He is not ill-appearing, toxic-appearing or diaphoretic.  HENT:     Head: Normocephalic and atraumatic.     Jaw: There is normal jaw occlusion.     Salivary Glands: Right salivary gland is not diffusely enlarged or tender. Left salivary gland is not diffusely enlarged or tender.     Right Ear: Hearing and external ear normal. No decreased hearing noted.     Left Ear: Hearing and external ear normal. No decreased hearing noted.     Nose: Nose normal. No congestion or rhinorrhea.     Right Nostril: No epistaxis.     Left Nostril: No epistaxis.     Right Turbinates: Not enlarged, swollen or pale.     Left Turbinates: Not enlarged, swollen or pale.     Right Sinus: No maxillary sinus tenderness or frontal sinus tenderness.     Left Sinus: No maxillary sinus tenderness or frontal sinus tenderness.     Mouth/Throat:     Lips: Pink. No lesions.     Mouth: Mucous membranes are moist. No oral lesions or angioedema.     Tongue: No lesions. Tongue does not deviate from midline.     Palate: No mass  and lesions.     Pharynx: Uvula midline. Pharyngeal swelling and postnasal drip present. No oropharyngeal exudate, posterior oropharyngeal erythema or uvula swelling.     Tonsils: No tonsillar exudate.  Eyes:     General: Lids are normal. Vision grossly intact. Gaze aligned appropriately. Allergic shiner present. No visual field deficit or scleral icterus.       Right eye: No foreign body, discharge or hordeolum.        Left eye: No foreign body, discharge or hordeolum.     Extraocular Movements: Extraocular  movements intact.     Right eye: Normal extraocular motion and no nystagmus.     Left eye: Normal extraocular motion and no nystagmus.     Conjunctiva/sclera:     Right eye: Right conjunctiva is injected. No chemosis, exudate or hemorrhage.    Left eye: Left conjunctiva is not injected. No chemosis, exudate or hemorrhage.    Pupils: Pupils are equal, round, and reactive to light.     Right eye: No corneal abrasion or fluorescein uptake.     Left eye: No corneal abrasion or fluorescein uptake.     Comments: Woods lamp eval with fluoerescein no dye uptake noted bilateral corneas/sclera/conjunctiva; medial right bulbar conjunctiva mild erythema only  Neck:     Trachea: Trachea and phonation normal.  Cardiovascular:     Rate and Rhythm: Normal rate and regular rhythm.     Pulses: Normal pulses.          Radial pulses are 2+ on the right side and 2+ on the left side.  Pulmonary:     Effort: Pulmonary effort is normal. No respiratory distress.     Breath sounds: Normal breath sounds and air entry. No stridor or transmitted upper airway sounds. No decreased breath sounds or wheezing.     Comments: Spoke full sentences without difficulty; no cough observed in exam room Abdominal:     General: Abdomen is flat.  Musculoskeletal:        General: Normal range of motion.     Right hand: Normal strength. Normal capillary refill.     Left hand: Normal strength. Normal capillary refill.     Cervical back: Normal range of motion and neck supple. No swelling, edema, deformity, erythema, signs of trauma, lacerations, rigidity, spasms, torticollis, tenderness or crepitus. No pain with movement. Normal range of motion.     Thoracic back: No swelling, edema, deformity, signs of trauma, lacerations, spasms or tenderness.     Right lower leg: No edema.     Left lower leg: No edema.  Lymphadenopathy:     Head:     Right side of head: No submental, submandibular, tonsillar, preauricular, posterior auricular or  occipital adenopathy.     Left side of head: No submental, submandibular, tonsillar, preauricular, posterior auricular or occipital adenopathy.     Cervical: No cervical adenopathy.     Right cervical: No superficial, deep or posterior cervical adenopathy.    Left cervical: No superficial, deep or posterior cervical adenopathy.  Skin:    General: Skin is warm and dry.     Capillary Refill: Capillary refill takes less than 2 seconds.     Coloration: Skin is not ashen, cyanotic, jaundiced, mottled, pale or sallow.     Findings: No abrasion, abscess, acne, bruising, burn, ecchymosis, erythema, signs of injury, laceration, lesion, petechiae, rash or wound.     Nails: There is no clubbing.  Neurological:     General: No focal deficit present.  Mental Status: He is alert and oriented to person, place, and time. Mental status is at baseline.     GCS: GCS eye subscore is 4. GCS verbal subscore is 5. GCS motor subscore is 6.     Cranial Nerves: No cranial nerve deficit, dysarthria or facial asymmetry.     Motor: Motor function is intact. No weakness, tremor, atrophy, abnormal muscle tone or seizure activity.     Coordination: Coordination is intact. Coordination normal.     Gait: Gait is intact. Gait normal.     Comments: In/out of chair and on/off exam table without difficulty; gait sure and steady in clinic; bilateral hand grasp equal 5/5  Psychiatric:        Attention and Perception: Attention and perception normal.        Mood and Affect: Mood and affect normal.        Speech: Speech normal.        Behavior: Behavior normal. Behavior is cooperative.        Thought Content: Thought content normal.        Cognition and Memory: Cognition and memory normal.        Judgment: Judgment normal.           Assessment & Plan:   A-seasonal allergic rhinitis  P-Patient may use normal saline nasal spray 2 sprays each nostril q2h wa as needed. flonase 1 spray each nostril BID OTC  Patient  denied personal or family history of ENT cancer.  Electronic Rx sent to his pharmacy of choice loraatadine 10mg  po daily #90 RF3.  Patient refused refill singulair 10mg  po at bedtime has used in the past  Avoid triggers if possible.  Shower prior to bedtime if exposed to triggers.  If allergic dust/dust mites recommend mattress/pillow covers/encasements; washing linens, vacuuming, sweeping, dusting weekly.  Call or return to clinic as needed if these symptoms worsen or fail to improve as anticipated. Patient verbalized understanding of instructions, agreed with plan of care and had no further questions at this time.   Discussed most likely mild corneal abrasion occurred this weekend and healed prior to appt today based on his symptoms.  Reassured patient pain has resolved no dye uptake vision baseline.  If new symptoms recommend appt with optometry  Avoid rubbing eyes.  May continue refresh 2 gtts ou TID prn dryness/irritation given 5 UD from clinic stock.  Patient verbalized understanding information/instructions and had no further questions at this time.  Exitcare handout on corneal abrasion. P2:  Avoidance and hand washing.

## 2023-07-25 NOTE — Patient Instructions (Signed)
Corneal Abrasion  A corneal abrasion is a scratch or injury to the clear tissue at the front of the eye (cornea). It can be very painful. The cornea protects the eye and helps the eye focus. The cornea is made up of many layers, but the surface layer is one of the most sensitive tissues in the body. A corneal abrasion heals fast when it is treated. If it is not treated, it can become infected and cause an open sore (ulcer). This can lead to scarring, and the scarring can affect vision. Sometimes after the abrasion heals, another abrasion can come back in the same place. What are the causes? A corneal abrasion may be caused by: A poke to the eye. A gritty or irritating substance (foreign body) in the eye. Too much eye rubbing. Very dry eyes. Certain eye infections. Contact lenses that do not fit right or are worn for too long. Injury can also happen when putting in contact lenses or taking them out. Eye surgery. Certain cornea problems that make it more likely to get a corneal abrasion. Sometimes, the cause is not known. What are the signs or symptoms? Symptoms of this condition include: Eye pain. The pain may get worse when you open, close, or move your eye. A feeling that something is poking your eye or is stuck in your eye. Tearing, redness, and sensitivity to light. Having trouble keeping your eye open, or not being able to keep it open. Blurred vision. Headache. How is this diagnosed? A corneal abrasion may be diagnosed based on your medical history, symptoms, and an eye exam. You may need to see a doctor who specializes in conditions of the eye (ophthalmologist or optometrist). Before the eye exam, you may be given eye drops that numb the eye. You may also have dye put in your eye with a dropper or a small paper strip. This dye helps your eye doctor see your injury better when using a light or an eye scope (slit lamp). How is this treated? Treatment may vary based on what caused the  corneal abrasion. It may include: Washing out your eye. Taking out anything that is stuck in your eye. Using antibiotic drops or ointment to treat or prevent an infection. Using eye drops that lessens inflammation and pain. Using steroid drops or ointment to treat redness, irritation, or inflammation. Applying a cold, wet cloth (cold compress) or ice pack to lessen the pain. Taking pain medicines. This may include over-the-counter medicines like ibuprofen. If you have a large or deep corneal abrasion, an opioid medicine may be prescribed. For large abrasions, an eye patch or a bandage soft contact lens might also be used. A bandage soft contact lens protects the cornea while it is healing. These are not used if the corneal abrasion is related to wearing contact lenses. This is because you may be more likely to get an eye infection. Follow these instructions at home: Medicines If you were prescribed eye drops or ointment, use them as told by your provider. You may be told to use: Antibiotic eye drops or ointment. Do not stop using them even if you start to feel better. Eye drops that moisten the eye (lubricating eye drops). Ask your provider if the medicine prescribed to you: Requires you to avoid driving or using machinery. Can cause constipation. You may need to take these actions to prevent or treat constipation: Drink enough fluid to keep your pee (urine) pale yellow. Take over-the-counter or prescription medicines. Eat foods that  are high in fiber, such as beans, whole grains, and fresh fruits and vegetables. Limit foods that are high in fat and processed sugars, such as fried or sweet foods. Take over-the-counter and prescription medicines only as told by your provider. Eye care Rest your eyes. Avoid bright light and overusing (straining) your eyes. Wear sunglasses. Do not rub or touch your eye. Do not wash out your eye. Ask your provider if you can use a cold compress on your eye to  lessen pain. If you have an eye patch: Wear it as told by your provider. Follow your provider's instructions about when to take it off. If you have a bandage soft contact lens, your provider will take it off during your follow-up visit. Do not wear your own contact lenses until your provider says it is okay. General instructions Do not drive or use machinery as told by your provider. You may not be able to judge distances as well if your vision is affected or if you are wearing an eye patch. Keep all follow-up visits. This helps to prevent infection and vision loss. Contact a health care provider if: You keep having eye pain and other symptoms for more than 2-3 days. You have new symptoms, such as more redness, tearing, or fluid (discharge) coming from your eye. You have swollen eyelids. Your vision gets much worse. You have discharge that makes your eyelids stick together in the morning. Your eye patch becomes so loose that you can blink your eye. Symptoms come back after your abrasion heals. Get help right away if: You have severe eye pain that does not get better with medicine. You have severe vision loss. This information is not intended to replace advice given to you by your health care provider. Make sure you discuss any questions you have with your health care provider. Document Revised: 06/16/2022 Document Reviewed: 06/16/2022 Elsevier Patient Education  2024 [employer].

## 2023-07-27 ENCOUNTER — Other Ambulatory Visit (HOSPITAL_COMMUNITY): Payer: Self-pay

## 2023-07-27 ENCOUNTER — Other Ambulatory Visit: Payer: Self-pay | Admitting: Family Medicine

## 2023-07-27 ENCOUNTER — Other Ambulatory Visit: Payer: Self-pay

## 2023-07-27 MED ORDER — TIRZEPATIDE-WEIGHT MANAGEMENT 2.5 MG/0.5ML ~~LOC~~ SOAJ
2.5000 mg | SUBCUTANEOUS | 1 refills | Status: DC
  Filled 2023-07-27 – 2023-08-30 (×2): qty 2, 28d supply, fill #0

## 2023-07-28 ENCOUNTER — Telehealth: Payer: Self-pay

## 2023-07-28 NOTE — Telephone Encounter (Signed)
Copied from CRM [id number]. Topic: General - Other >> Jul 28, 2023 10:08 AM Geroge Baseman wrote: Reason for CRM: Insurance calling about prior auth for zepbound, will be faxing over paperwork

## 2023-07-29 ENCOUNTER — Other Ambulatory Visit: Payer: Self-pay | Admitting: Family Medicine

## 2023-07-29 DIAGNOSIS — K2100 Gastro-esophageal reflux disease with esophagitis, without bleeding: Secondary | ICD-10-CM

## 2023-07-31 ENCOUNTER — Other Ambulatory Visit: Payer: Self-pay | Admitting: Family Medicine

## 2023-08-03 ENCOUNTER — Telehealth: Payer: Self-pay

## 2023-08-03 NOTE — Telephone Encounter (Signed)
 CRM # [id number] Owner: None Status: Unresolved Open  Priority: Routine Created on: 08/03/2023 02:59 PM By: Ludwig Lean T   Primary Information  Source  Prior Authorization (Vendor/Third Party)   Subject     Topic  General - Other     Communication  Reason for CRM: Rx Benefits    Bruce Kemp 31-Dec-1985    Prior authorization faxed on 18th of feb    Confirm reception of documents.    Callback: [phone]    Fax: [phone]

## 2023-08-04 ENCOUNTER — Telehealth: Payer: Self-pay

## 2023-08-04 NOTE — Telephone Encounter (Signed)
 PA-Zepbound send to plan w/chart notes

## 2023-08-07 ENCOUNTER — Other Ambulatory Visit (HOSPITAL_COMMUNITY): Payer: Self-pay

## 2023-08-15 ENCOUNTER — Other Ambulatory Visit: Payer: Self-pay

## 2023-08-15 DIAGNOSIS — K2100 Gastro-esophageal reflux disease with esophagitis, without bleeding: Secondary | ICD-10-CM

## 2023-08-15 MED ORDER — PANTOPRAZOLE SODIUM 40 MG PO TBEC
40.0000 mg | DELAYED_RELEASE_TABLET | Freq: Every day | ORAL | 0 refills | Status: DC
Start: 2023-08-15 — End: 2023-08-29

## 2023-08-15 NOTE — Telephone Encounter (Signed)
 Copied from CRM [id number]. Topic: Medical Record Request - Provider/Facility Request >> Aug 15, 2023 11:37 AM Hamdi H wrote: Reason for CRM: Rx Benefits Prior Auth dept called to request chart notes to support behavioral modification. They want that information faxed over to them at [phone].

## 2023-08-17 NOTE — Telephone Encounter (Unsigned)
 Copied from CRM [id number]. Topic: Clinical - Prescription Issue >> Aug 17, 2023  1:59 PM Gildardo Pounds wrote: Reason for CRM: Amy with RX benefits calling about prior authorization for tirzepatide (ZEPBOUND) 2.5 MG/0.5ML Pen. It has been dismissed for lack of information. Missing questionnaire and supporting documentation for questions answered on questionnaire. Callback number is [phone]

## 2023-08-19 ENCOUNTER — Telehealth: Payer: Self-pay | Admitting: Registered Nurse

## 2023-08-19 ENCOUNTER — Other Ambulatory Visit: Payer: Self-pay | Admitting: Registered Nurse

## 2023-08-19 DIAGNOSIS — Z0000 Encounter for general adult medical examination without abnormal findings: Secondary | ICD-10-CM

## 2023-08-19 DIAGNOSIS — R7401 Elevation of levels of liver transaminase levels: Secondary | ICD-10-CM

## 2023-08-19 NOTE — Telephone Encounter (Signed)
 Patient had CMET and CBC drawn UC Jan 2025. Essentially normal except GI infection WBC elevated and ALT 50.  See tcon dated 06/15/2023.  Patient reported he had appts pending with PCM to continue weight loss and decreasing alcohol intake.   No lipids/Hgba1c/TSH performed at UC or PCM.  Cost for executive panel less expensive than single tests therefore bundle ordered for patient.  Telephone message left for patient to schedule fasting labs with RN Olegario Messier between now and 11 Dec 2023 to complete Be Well 2026 requirements.

## 2023-08-29 ENCOUNTER — Other Ambulatory Visit: Payer: Self-pay

## 2023-08-29 DIAGNOSIS — K2100 Gastro-esophageal reflux disease with esophagitis, without bleeding: Secondary | ICD-10-CM

## 2023-08-29 MED ORDER — PANTOPRAZOLE SODIUM 40 MG PO TBEC: 40.0000 mg | DELAYED_RELEASE_TABLET | Freq: Every day | ORAL | 1 refills | Status: DC

## 2023-08-30 ENCOUNTER — Other Ambulatory Visit (HOSPITAL_COMMUNITY): Payer: Self-pay

## 2023-08-30 ENCOUNTER — Other Ambulatory Visit: Payer: Self-pay

## 2023-09-05 ENCOUNTER — Other Ambulatory Visit: Payer: Self-pay | Admitting: Family Medicine

## 2023-09-06 NOTE — Telephone Encounter (Signed)
 OV 07/10/23 Requested Prescriptions  Pending Prescriptions Disp Refills   rosuvastatin (CRESTOR) 10 MG tablet [Pharmacy Med Name: ROSUVASTATIN 10MG  TABLETS] 90 tablet 0    Sig: TAKE 1 TABLET BY MOUTH EVERY DAY     Cardiovascular:  Antilipid - Statins 2 Failed - 09/06/2023  2:55 PM      Failed - Cr in normal range and within 360 days    Creat  Date Value Ref Range Status  07/12/2022 0.99 0.60 - 1.26 mg/dL Final         Failed - Valid encounter within last 12 months    Recent Outpatient Visits           1 month ago Insomnia, unspecified type   [facility] Donita Brooks, MD   1 year ago Benign essential HTN    [facility] Donita Brooks, MD              Failed - Lipid Panel in normal range within the last 12 months    Cholesterol, Total  Date Value Ref Range Status  11/24/2022 179 100 - 199 mg/dL Final   LDL Cholesterol (Calc)  Date Value Ref Range Status  07/12/2022 164 (H) mg/dL (calc) Final    Comment:    Reference range: <100 . Desirable range <100 mg/dL for primary prevention;   <70 mg/dL for patients with CHD or diabetic patients  with > or = 2 CHD risk factors. Marland Kitchen LDL-C is now calculated using the Martin-Hopkins  calculation, which is a validated novel method providing  better accuracy than the Friedewald equation in the  estimation of LDL-C.  Horald Pollen et al. Lenox Ahr. 1610;960(45): 2061-2068  (http://education.[employer].com/faq/FAQ164)    LDL Chol Calc (NIH)  Date Value Ref Range Status  11/24/2022 116 (H) 0 - 99 mg/dL Final   HDL  Date Value Ref Range Status  11/24/2022 42 >39 mg/dL Final   Triglycerides  Date Value Ref Range Status  11/24/2022 118 0 - 149 mg/dL Final         Passed - Patient is not pregnant

## 2023-09-13 ENCOUNTER — Other Ambulatory Visit: Payer: Self-pay

## 2023-09-13 DIAGNOSIS — G479 Sleep disorder, unspecified: Secondary | ICD-10-CM

## 2023-09-13 DIAGNOSIS — I10 Essential (primary) hypertension: Secondary | ICD-10-CM

## 2023-09-13 MED ORDER — ZEPBOUND 5 MG/0.5ML ~~LOC~~ SOAJ: 5.0000 mg | SUBCUTANEOUS | 0 refills | Status: DC

## 2023-09-14 ENCOUNTER — Other Ambulatory Visit (HOSPITAL_COMMUNITY): Payer: Self-pay

## 2023-09-14 ENCOUNTER — Other Ambulatory Visit: Payer: Self-pay

## 2023-09-14 DIAGNOSIS — G479 Sleep disorder, unspecified: Secondary | ICD-10-CM

## 2023-09-14 MED ORDER — ZEPBOUND 5 MG/0.5ML ~~LOC~~ SOAJ
5.0000 mg | SUBCUTANEOUS | 0 refills | Status: DC
  Filled 2023-09-14: qty 2, 28d supply, fill #0

## 2023-09-16 ENCOUNTER — Other Ambulatory Visit (HOSPITAL_COMMUNITY): Payer: Self-pay

## 2023-10-03 ENCOUNTER — Encounter: Payer: Self-pay | Admitting: Family Medicine

## 2023-10-16 ENCOUNTER — Encounter: Payer: Self-pay | Admitting: Family Medicine

## 2023-10-16 ENCOUNTER — Other Ambulatory Visit: Payer: Self-pay

## 2023-10-16 ENCOUNTER — Other Ambulatory Visit (HOSPITAL_COMMUNITY): Payer: Self-pay

## 2023-10-16 MED ORDER — ZEPBOUND 7.5 MG/0.5ML ~~LOC~~ SOAJ
7.5000 mg | SUBCUTANEOUS | 1 refills | Status: DC
  Filled 2023-10-16: qty 2, 28d supply, fill #0
  Filled 2023-11-16: qty 2, 28d supply, fill #1

## 2023-10-26 ENCOUNTER — Encounter: Payer: Self-pay | Admitting: Registered Nurse

## 2023-10-26 ENCOUNTER — Telehealth: Payer: Self-pay | Admitting: Registered Nurse

## 2023-10-26 DIAGNOSIS — R197 Diarrhea, unspecified: Secondary | ICD-10-CM

## 2023-10-26 NOTE — Telephone Encounter (Signed)
 Notified HR and supervisor excused absence 5/15-16/25 due to diarrhea falls under communicable disease policy for excused absence at work.  Notified by spouse patient having diarrhea today.  They have not eaten the same things for meals.  Spouse symptoms started first and later in the day patient experienced symptoms.  Discussed with spouse may use immodium 2mg  with each loose stool OTC max 8 tabs per 24 hours.  Attempted to speak with patient called cell no answer and left message to please contact NP

## 2023-10-30 ENCOUNTER — Other Ambulatory Visit

## 2023-10-30 VITALS — BP 115/68 | Ht 73.5 in | Wt 298.0 lb

## 2023-10-30 DIAGNOSIS — Z0000 Encounter for general adult medical examination without abnormal findings: Secondary | ICD-10-CM

## 2023-10-30 NOTE — Progress Notes (Signed)
 Be Well Labs

## 2023-10-30 NOTE — Telephone Encounter (Signed)
 Spouse reported symptoms resolved Friday evening and patient back at work today without difficulty.

## 2023-10-31 ENCOUNTER — Ambulatory Visit: Payer: Self-pay | Admitting: Registered Nurse

## 2023-10-31 DIAGNOSIS — E785 Hyperlipidemia, unspecified: Secondary | ICD-10-CM | POA: Insufficient documentation

## 2023-10-31 DIAGNOSIS — R7401 Elevation of levels of liver transaminase levels: Secondary | ICD-10-CM

## 2023-10-31 DIAGNOSIS — Z6838 Body mass index (BMI) 38.0-38.9, adult: Secondary | ICD-10-CM | POA: Insufficient documentation

## 2023-10-31 LAB — CMP12+LP+TP+TSH+6AC+CBC/D/PLT
ALT: 79 IU/L — ABNORMAL HIGH (ref 0–44)
AST: 50 IU/L — ABNORMAL HIGH (ref 0–40)
Albumin: 4.6 g/dL (ref 4.1–5.1)
Alkaline Phosphatase: 68 IU/L (ref 44–121)
BUN/Creatinine Ratio: 15 (ref 9–20)
BUN: 14 mg/dL (ref 6–20)
Basophils Absolute: 0 10*3/uL (ref 0.0–0.2)
Basos: 1 %
Bilirubin Total: 0.8 mg/dL (ref 0.0–1.2)
Calcium: 9.8 mg/dL (ref 8.7–10.2)
Chloride: 97 mmol/L (ref 96–106)
Chol/HDL Ratio: 4.4 ratio (ref 0.0–5.0)
Cholesterol, Total: 159 mg/dL (ref 100–199)
Creatinine, Ser: 0.96 mg/dL (ref 0.76–1.27)
EOS (ABSOLUTE): 0.3 10*3/uL (ref 0.0–0.4)
Eos: 5 %
Estimated CHD Risk: 0.9 times avg. (ref 0.0–1.0)
Free Thyroxine Index: 2.1 (ref 1.2–4.9)
GGT: 56 IU/L (ref 0–65)
Globulin, Total: 2.9 g/dL (ref 1.5–4.5)
Glucose: 94 mg/dL (ref 70–99)
HDL: 36 mg/dL — ABNORMAL LOW (ref >39)
Hematocrit: 47.6 % (ref 37.5–51.0)
Hemoglobin: 16.3 g/dL (ref 13.0–17.7)
Immature Grans (Abs): 0 10*3/uL (ref 0.0–0.1)
Immature Granulocytes: 0 %
Iron: 93 ug/dL (ref 38–169)
LDH: 201 IU/L (ref 121–224)
LDL Chol Calc (NIH): 95 mg/dL (ref 0–99)
Lymphocytes Absolute: 2.3 10*3/uL (ref 0.7–3.1)
Lymphs: 34 %
MCH: 31.1 pg (ref 26.6–33.0)
MCHC: 34.2 g/dL (ref 31.5–35.7)
MCV: 91 fL (ref 79–97)
Monocytes Absolute: 0.6 10*3/uL (ref 0.1–0.9)
Monocytes: 9 %
Neutrophils Absolute: 3.6 10*3/uL (ref 1.4–7.0)
Neutrophils: 51 %
Phosphorus: 3.1 mg/dL (ref 2.8–4.1)
Platelets: 322 10*3/uL (ref 150–450)
Potassium: 4.2 mmol/L (ref 3.5–5.2)
RBC: 5.24 x10E6/uL (ref 4.14–5.80)
RDW: 13.1 % (ref 11.6–15.4)
Sodium: 138 mmol/L (ref 134–144)
T3 Uptake Ratio: 27 % (ref 24–39)
T4, Total: 7.7 ug/dL (ref 4.5–12.0)
TSH: 1.92 u[IU]/mL (ref 0.450–4.500)
Total Protein: 7.5 g/dL (ref 6.0–8.5)
Triglycerides: 161 mg/dL — ABNORMAL HIGH (ref 0–149)
Uric Acid: 5.3 mg/dL (ref 3.8–8.4)
VLDL Cholesterol Cal: 28 mg/dL (ref 5–40)
WBC: 6.9 10*3/uL (ref 3.4–10.8)
eGFR: 104 mL/min/{1.73_m2} (ref >59)

## 2023-10-31 LAB — HEMOGLOBIN A1C
Est. average glucose Bld gHb Est-mCnc: 114 mg/dL
Hgb A1c MFr Bld: 5.6 % (ref 4.8–5.6)

## 2023-10-31 NOTE — Progress Notes (Signed)
 My chart message sent to patient  Bruce Kemp,  Your total and LDL cholesterol improved on medication  total 159 (179 previous)  HDL good cholesterol 36 decreased previous 42 (higher number better) and LDL bad cholesterol 95 previous 116 ratio 4.4 previous 4.3 (lower is better)  Liver enzymes AST elevated worsening 50 (47 previous) ALT 79 (55 previous)  This could be related to weight gain/medications and/or alcohol intake.  Hgba1c 3 month blood sugar 5.6 previous 5.6 (5.7 considered prediabetes)  Your LDL and Hgba1c met requirements for Be Well 2026.  I gave HR Bruce Kemp [employer] form today; discount starts 13 Mar 2024.  Fatty liver disease, DASH diet/mediterranean diet handouts sent to your my chart.  I recommend decreasing alcohol intake, continue exercise/diet changes to restart weight loss.  Next labs Jan-May 2026 for Be Well 2027 fasting.  Follow up with PCM annually  Please see RN [name] if you want a printed copy of labs and notify her if you want results sent electronically to any other providers.I recommend exercise 150 minutes per week; dietary fiber 30 grams per day men per up to date; eat whole grains/fruits/vegetables; keep added sugars to less than 150 calories/9 teaspoons for men per American Heart Association; fasting and 3 month blood sugar average,iron, electrolytes, kidney/thyroid function and complete blood count normal  Please let us  know if you have further questions or concerns.  Sincerely, [name] NP-C  Please let us  know if you have further questions/concerns.  Sincerely,  [name] NP-C

## 2023-11-04 NOTE — Telephone Encounter (Signed)
 See results note 10/31/2023 in Epic for follow up lab results

## 2023-11-17 ENCOUNTER — Other Ambulatory Visit (HOSPITAL_COMMUNITY): Payer: Self-pay

## 2023-11-20 ENCOUNTER — Encounter: Payer: Self-pay | Admitting: Family Medicine

## 2023-11-20 ENCOUNTER — Other Ambulatory Visit: Payer: Self-pay

## 2023-11-20 ENCOUNTER — Other Ambulatory Visit (HOSPITAL_COMMUNITY): Payer: Self-pay

## 2023-11-20 MED ORDER — ZEPBOUND 10 MG/0.5ML ~~LOC~~ SOAJ
10.0000 mg | SUBCUTANEOUS | 0 refills | Status: DC
  Filled 2023-11-20: qty 2, 28d supply, fill #0

## 2023-11-22 ENCOUNTER — Ambulatory Visit: Payer: Self-pay | Admitting: Registered Nurse

## 2023-11-27 MED ORDER — ALPRAZOLAM 0.5 MG PO TABS: 0.5000 mg | ORAL_TABLET | Freq: Every evening | ORAL | 0 refills | Status: DC | PRN

## 2023-12-14 ENCOUNTER — Other Ambulatory Visit: Payer: Self-pay | Admitting: Family Medicine

## 2023-12-22 ENCOUNTER — Other Ambulatory Visit: Payer: Self-pay

## 2023-12-22 ENCOUNTER — Other Ambulatory Visit (HOSPITAL_COMMUNITY): Payer: Self-pay

## 2023-12-22 MED ORDER — ZEPBOUND 12.5 MG/0.5ML ~~LOC~~ SOAJ
12.5000 mg | SUBCUTANEOUS | 1 refills | Status: DC
  Filled 2023-12-22 – 2023-12-26 (×2): qty 2, 28d supply, fill #0

## 2023-12-22 MED ORDER — ZEPBOUND 12.5 MG/0.5ML ~~LOC~~ SOAJ: 12.5000 mg | SUBCUTANEOUS | 1 refills | Status: DC

## 2023-12-25 ENCOUNTER — Other Ambulatory Visit (HOSPITAL_COMMUNITY): Payer: Self-pay

## 2023-12-26 ENCOUNTER — Other Ambulatory Visit (HOSPITAL_COMMUNITY): Payer: Self-pay

## 2023-12-27 ENCOUNTER — Other Ambulatory Visit (HOSPITAL_COMMUNITY): Payer: Self-pay

## 2024-01-02 ENCOUNTER — Encounter: Payer: Self-pay | Admitting: Registered Nurse

## 2024-01-02 DIAGNOSIS — M461 Sacroiliitis, not elsewhere classified: Secondary | ICD-10-CM

## 2024-01-02 MED ORDER — ETODOLAC 500 MG PO TABS: 500.0000 mg | ORAL_TABLET | Freq: Two times a day (BID) | ORAL | 1 refills | Status: AC

## 2024-01-15 ENCOUNTER — Other Ambulatory Visit (HOSPITAL_COMMUNITY): Payer: Self-pay

## 2024-01-15 ENCOUNTER — Other Ambulatory Visit: Payer: Self-pay

## 2024-01-15 MED ORDER — ZEPBOUND 15 MG/0.5ML ~~LOC~~ SOAJ
15.0000 mg | SUBCUTANEOUS | 1 refills | Status: DC
  Filled 2024-01-15: qty 6, 84d supply, fill #0

## 2024-02-06 ENCOUNTER — Other Ambulatory Visit: Payer: Self-pay | Admitting: Family Medicine

## 2024-02-06 NOTE — Telephone Encounter (Signed)
 Prescription Request  02/06/2024  LOV: 07/10/2023  What is the name of the medication or equipment?   valsartan -hydrochlorothiazide  (DIOVAN -HCT) 80-12.5 MG tablet   Have you contacted your pharmacy to request a refill? Yes   Which pharmacy would you like this sent to?  [id number] [place], [place] - [address] AT [facility] [address] & [facility] [address] [place] [address] Phone: [phone] Fax: [phone]    Patient notified that their request is being sent to the clinical staff for review and that they should receive a response within 2 business days.   Please advise pharmacist.

## 2024-02-08 MED ORDER — VALSARTAN-HYDROCHLOROTHIAZIDE 80-12.5 MG PO TABS: 1.0000 | ORAL_TABLET | Freq: Every day | ORAL | 0 refills | Status: DC

## 2024-02-08 NOTE — Telephone Encounter (Signed)
 Requested Prescriptions  Pending Prescriptions Disp Refills   valsartan -hydrochlorothiazide  (DIOVAN -HCT) 80-12.5 MG tablet 90 tablet 0    Sig: Take 1 tablet by mouth daily.     Cardiovascular: ARB + Diuretic Combos Failed - 02/08/2024 11:30 AM      Failed - Valid encounter within last 6 months    Recent Outpatient Visits           7 months ago Insomnia, unspecified type   [facility] Duanne, Butler DASEN, MD   1 year ago Benign essential HTN   [facility] Duanne, Butler DASEN, MD              Passed - K in normal range and within 180 days    Potassium  Date Value Ref Range Status  10/30/2023 4.2 3.5 - 5.2 mmol/L Final         Passed - Na in normal range and within 180 days    Sodium  Date Value Ref Range Status  10/30/2023 138 134 - 144 mmol/L Final         Passed - Cr in normal range and within 180 days    Creat  Date Value Ref Range Status  07/12/2022 0.99 0.60 - 1.26 mg/dL Final   Creatinine, Ser  Date Value Ref Range Status  10/30/2023 0.96 0.76 - 1.27 mg/dL Final         Passed - eGFR is 10 or above and within 180 days    GFR calc Af Amer  Date Value Ref Range Status  10/10/2019 >60 >60 mL/min Final   GFR calc non Af Amer  Date Value Ref Range Status  10/10/2019 >60 >60 mL/min Final   eGFR  Date Value Ref Range Status  10/30/2023 104 >59 mL/min/1.73 Final         Passed - Patient is not pregnant      Passed - Last BP in normal range    BP Readings from Last 1 Encounters:  10/30/23 115/68

## 2024-02-23 ENCOUNTER — Ambulatory Visit: Admitting: Family Medicine

## 2024-02-23 ENCOUNTER — Other Ambulatory Visit: Payer: Self-pay | Admitting: Family Medicine

## 2024-02-23 ENCOUNTER — Encounter: Payer: Self-pay | Admitting: Family Medicine

## 2024-02-23 VITALS — BP 124/72 | HR 94 | Temp 97.6°F | Ht 73.5 in | Wt 274.6 lb

## 2024-02-23 DIAGNOSIS — I10 Essential (primary) hypertension: Secondary | ICD-10-CM | POA: Diagnosis not present

## 2024-02-23 DIAGNOSIS — K2100 Gastro-esophageal reflux disease with esophagitis, without bleeding: Secondary | ICD-10-CM

## 2024-02-23 LAB — COMPREHENSIVE METABOLIC PANEL WITH GFR
AG Ratio: 1.6 (calc) (ref 1.0–2.5)
ALT: 62 U/L — ABNORMAL HIGH (ref 9–46)
AST: 37 U/L (ref 10–40)
Albumin: 5 g/dL (ref 3.6–5.1)
Alkaline phosphatase (APISO): 61 U/L (ref 36–130)
BUN: 16 mg/dL (ref 7–25)
CO2: 28 mmol/L (ref 20–32)
Calcium: 10.3 mg/dL (ref 8.6–10.3)
Chloride: 98 mmol/L (ref 98–110)
Creat: 1.18 mg/dL (ref 0.60–1.26)
Globulin: 3.1 g/dL (ref 1.9–3.7)
Glucose, Bld: 95 mg/dL (ref 65–99)
Potassium: 4.2 mmol/L (ref 3.5–5.3)
Sodium: 138 mmol/L (ref 135–146)
Total Bilirubin: 1.3 mg/dL — ABNORMAL HIGH (ref 0.2–1.2)
Total Protein: 8.1 g/dL (ref 6.1–8.1)
eGFR: 82 mL/min/1.73m2 (ref >=60)

## 2024-02-23 LAB — CBC WITH DIFFERENTIAL/PLATELET
Absolute Lymphocytes: 2268 {cells}/uL (ref 850–3900)
Absolute Monocytes: 511 {cells}/uL (ref 200–950)
Basophils Absolute: 28 {cells}/uL (ref 0–200)
Basophils Relative: 0.4 %
Eosinophils Absolute: 343 {cells}/uL (ref 15–500)
Eosinophils Relative: 4.9 %
HCT: 51.2 % — ABNORMAL HIGH (ref 38.5–50.0)
Hemoglobin: 17.3 g/dL — ABNORMAL HIGH (ref 13.2–17.1)
MCH: 30.1 pg (ref 27.0–33.0)
MCHC: 33.8 g/dL (ref 32.0–36.0)
MCV: 89.2 fL (ref 80.0–100.0)
MPV: 10.4 fL (ref 7.5–12.5)
Monocytes Relative: 7.3 %
Neutro Abs: 3850 {cells}/uL (ref 1500–7800)
Neutrophils Relative %: 55 %
Platelets: 398 Thousand/uL (ref 140–400)
RBC: 5.74 Million/uL (ref 4.20–5.80)
RDW: 13.1 % (ref 11.0–15.0)
Total Lymphocyte: 32.4 %
WBC: 7 Thousand/uL (ref 3.8–10.8)

## 2024-02-23 LAB — LIPID PANEL
Cholesterol: 155 mg/dL (ref <200)
HDL: 44 mg/dL (ref >=40)
LDL Cholesterol (Calc): 86 mg/dL
Non-HDL Cholesterol (Calc): 111 mg/dL (ref <130)
Total CHOL/HDL Ratio: 3.5 (calc) (ref <5.0)
Triglycerides: 150 mg/dL — ABNORMAL HIGH (ref <150)

## 2024-02-23 MED ORDER — VALSARTAN 80 MG PO TABS: 80.0000 mg | ORAL_TABLET | Freq: Every day | ORAL | 3 refills | Status: AC

## 2024-02-23 NOTE — Progress Notes (Signed)
 Subjective:    Patient ID: Bruce Kemp, male    DOB: 04-11-1986, 38 y.o.   MRN: [id number]  Patient has a history of ADD as well as hypertension.  His blood pressure is well-controlled today.  He reports feeling dehydrated.  He has lost substantial weight since starting Zepbound . Wt Readings from Last 3 Encounters:  02/23/24 274 lb 9.6 oz (124.6 kg)  10/30/23 298 lb (135.2 kg)  07/10/23 (!) 309 lb 9.6 oz (140.4 kg)   He has lost almost 30 pounds.  Previously his lab work showed elevated liver function test which I attribute to fatty liver disease.  He is interested in rechecking these today to see if they are better.  He is on rosuvastatin .  He has drastically reduced his alcohol consumption.  Past Medical History:  Diagnosis Date   Anxiety    Asthma    GERD (gastroesophageal reflux disease)    No past surgical history on file. Current Outpatient Medications on File Prior to Visit  Medication Sig Dispense Refill   ALPRAZolam  (XANAX ) 0.5 MG tablet Take 1 tablet (0.5 mg total) by mouth at bedtime as needed for anxiety. 30 tablet 0   etodolac  (LODINE ) 500 MG tablet Take 1 tablet (500 mg total) by mouth 2 (two) times daily. 180 tablet 1   loratadine  (CLARITIN ) 10 MG tablet Take 1 tablet (10 mg total) by mouth daily. 90 tablet 3   methylphenidate (METADATE CD) 20 MG CR capsule Take 20 mg by mouth every morning.     pantoprazole  (PROTONIX ) 40 MG tablet Take 1 tablet (40 mg total) by mouth daily. 90 tablet 1   rosuvastatin  (CRESTOR ) 10 MG tablet TAKE 1 TABLET BY MOUTH EVERY DAY 90 tablet 0   tirzepatide  (ZEPBOUND ) 15 MG/0.5ML Pen Inject 15 mg into the skin once a week. 6 mL 1   Vilazodone HCl (VIIBRYD) 40 MG TABS Take 40 mg by mouth daily.     No current facility-administered medications on file prior to visit.   No Known Allergies Social History   Socioeconomic History   Marital status: Single    Spouse name: Not on file   Number of children: Not on file   Years of education: Not  on file   Highest education level: Not on file  Occupational History   Occupation: Engineer, civil (consulting): [employer]  Tobacco Use   Smoking status: Former    Current packs/day: 0.00    Types: Cigarettes    Quit date: 09/12/2022    Years since quitting: 1.4    Passive exposure: Never   Smokeless tobacco: Never  Vaping Use   Vaping status: Never Used  Substance and Sexual Activity   Alcohol use: Yes    Comment: socially   Drug use: No   Sexual activity: Yes    Birth control/protection: Pill  Other Topics Concern   Not on file  Social History Narrative   Not on file   Social Drivers of Health   Financial Resource Strain: Not on file  Food Insecurity: Not on file  Transportation Needs: Not on file  Physical Activity: Not on file  Stress: No Stress Concern Present (06/05/2017)   [employer] of Occupational Health - Occupational Stress Questionnaire    Feeling of Stress : Only a little  Social Connections: Not on file  Intimate Partner Violence: Not At Risk (06/05/2017)   Humiliation, Afraid, Rape, and Kick questionnaire    Fear of Current or Ex-Partner: No  Emotionally Abused: No    Physically Abused: No    Sexually Abused: No   Family History  Problem Relation Age of Onset   Diabetes Father    Hypertension Maternal Grandmother    Diabetes Maternal Grandfather    Hypertension Maternal Grandfather    Hypertension Paternal Grandmother    Hypertension Paternal Grandfather    Kidney cancer Neg Hx    Kidney disease Neg Hx    Prostate cancer Neg Hx       Review of Systems  All other systems reviewed and are negative.      Objective:   Physical Exam Vitals reviewed.  Constitutional:      General: He is not in acute distress.    Appearance: Normal appearance. He is well-developed. He is obese. He is not diaphoretic.  HENT:     Head: Normocephalic and atraumatic.     Right Ear: Tympanic membrane and ear canal normal.     Left Ear: Tympanic  membrane and ear canal normal.     Nose: No congestion or rhinorrhea.     Mouth/Throat:     Pharynx: No oropharyngeal exudate or posterior oropharyngeal erythema.  Eyes:     Conjunctiva/sclera: Conjunctivae normal.  Neck:     Thyroid: No thyromegaly.     Vascular: No carotid bruit or JVD.     Trachea: No tracheal deviation.  Cardiovascular:     Rate and Rhythm: Normal rate and regular rhythm.     Pulses: Normal pulses.     Heart sounds: Normal heart sounds.  Pulmonary:     Effort: Pulmonary effort is normal. No respiratory distress.     Breath sounds: Normal breath sounds. No wheezing, rhonchi or rales.  Abdominal:     General: Bowel sounds are normal. There is no distension.     Palpations: Abdomen is soft.     Tenderness: There is no abdominal tenderness. There is no guarding or rebound.  Musculoskeletal:     Cervical back: Neck supple.     Right lower leg: No edema.     Left lower leg: No edema.  Lymphadenopathy:     Cervical: No cervical adenopathy.  Skin:    Findings: No rash.  Neurological:     General: No focal deficit present.     Mental Status: He is alert and oriented to person, place, and time. Mental status is at baseline.     Motor: No weakness.     Coordination: Coordination is intact. Coordination normal.     Deep Tendon Reflexes: Reflexes normal.  Psychiatric:        Judgment: Judgment normal.           Assessment & Plan:  Benign essential HTN - Plan: Comprehensive metabolic panel with GFR, Lipid panel, CBC with Differential/Platelet Blood pressure is excellent.  Will discontinue valsartan  HCTZ and replace just with valsartan  80 mg a day to avoid dehydration.  Congratulated the patient on the weight loss.  I will recheck liver function test today.  I anticipate that they will be better having lost substantial weight.  Persistently elevated I will take the patient off rosuvastatin  and see if it is related to his cholesterol medication.  Continue Zepbound   for now

## 2024-02-23 NOTE — Telephone Encounter (Signed)
 Requested Prescriptions  Pending Prescriptions Disp Refills   pantoprazole  (PROTONIX ) 40 MG tablet [Pharmacy Med Name: PANTOPRAZOLE  40MG  TABLETS] 90 tablet 3    Sig: TAKE 1 TABLET(40 MG) BY MOUTH DAILY     Gastroenterology: Proton Pump Inhibitors Passed - 02/23/2024  5:26 PM      Passed - Valid encounter within last 12 months    Recent Outpatient Visits           Today Benign essential HTN   [place] [facility] Duanne Butler DASEN, MD   7 months ago Insomnia, unspecified type   [place] [facility] Duanne Butler DASEN, MD   1 year ago Benign essential HTN    [facility] Pick[place], Butler DASEN, MD

## 2024-02-26 ENCOUNTER — Ambulatory Visit: Payer: Self-pay | Admitting: Family Medicine

## 2024-03-19 ENCOUNTER — Other Ambulatory Visit: Payer: Self-pay | Admitting: Family Medicine

## 2024-03-27 ENCOUNTER — Encounter: Payer: Self-pay | Admitting: Registered Nurse

## 2024-03-27 DIAGNOSIS — J069 Acute upper respiratory infection, unspecified: Secondary | ICD-10-CM

## 2024-03-27 NOTE — Telephone Encounter (Signed)
 Patient has work meeting and asked that I call back later this am.  Discussed I would call back in about an hour to discuss his symptoms and if he has performed a home covid test.

## 2024-03-27 NOTE — Telephone Encounter (Signed)
 Spoke with patient via telephone has not taken temperature sweaty at times but didn't feel like high fever.  Some body aches but not as bad as when he has had the flu.  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  Discussed home covid test today notify me of results.  Has tried dayquil/nyquil but when wears off very congested and sore throat.  Discussed multiple viruses circulating in community along with ragweed allergies at this time.  Trial of nasal saline 2 sprays each nostril q2h wa prn congestion and fluticasone  nasal 1 spray each nostril twice a day after nasal saline use.  If still congested may add afrin 1 spray each nostril twice a day but only use for maximum of 5 days discussed risk of rebound swelling or nasal ulcers with longer use.  Patient may also buy sudafed 30mg  take 2 initially then redose as wearing off  May cause drowsiness/med head and do not drink any alcohol or drive after taking medication until side effects known.  Max 240mg /8 tablets per 24 hours.  Must show drivers license at pharmacy to purchase.  I am in clinic tomorrow if patient wants evaluation and onsite 11a-2p.  Patient A&Ox3 spoke full sentences without difficulty duration of call 5 minutes.  Some audible nasal congestion and sniffing.  Patient will take temperature this afternoon.  Patient agreed with plan of care and had no further questions at this time.  If covid test negative and no worsening of symptoms may return to work onsite tomorrow as long as afebrile, no vomiting/diarrhea in past 24 hours or new/worsening symptoms. HR and patient supervisor notified excused absence for yesterday.

## 2024-03-28 NOTE — Telephone Encounter (Signed)
 Spoke with patient via telephone stated covid test was negative yesterday but feeling worse overnight so stayed home.  Not working remote.  Mucous yellow to clear and coughing, sore throat, fatigue.  Using nasal saline, dayquil, nyquil.  Patient found his bottle of bromphed DM at home and asking if he can start today.  Discussed yes he can start use 1 teaspoon every 6 hours prn runny nose/congestion/cough.  Patient does not feel like he needs inhaler or prednisone  at this time.  Denied wheezing/shortness of breath.  Temperature 63F last night.  Encouraged hydration/rest today.  Supervisor and HR notified excused absence extended 24 hours.  Patient to notify me if bloody, chunky, brown mucous, fever/chills, n/v/d, wheezing or shortness of breath.  Patient A&Ox3 spoke full sentences without difficulty audible nasal congestion during 4 minute call.  Patient agreed with plan of care and had no further questions at this time.

## 2024-04-02 ENCOUNTER — Other Ambulatory Visit (HOSPITAL_COMMUNITY): Payer: Self-pay

## 2024-04-02 MED ORDER — METHYLPHENIDATE HCL ER (CD) 20 MG PO CPCR
20.0000 mg | ORAL_CAPSULE | Freq: Every day | ORAL | 0 refills | Status: AC
  Filled 2024-04-02: qty 30, 30d supply, fill #0

## 2024-04-03 ENCOUNTER — Other Ambulatory Visit (HOSPITAL_COMMUNITY): Payer: Self-pay

## 2024-04-03 ENCOUNTER — Other Ambulatory Visit: Payer: Self-pay

## 2024-04-03 ENCOUNTER — Encounter: Payer: Self-pay | Admitting: Family Medicine

## 2024-04-03 MED ORDER — ZEPBOUND 15 MG/0.5ML ~~LOC~~ SOAJ
15.0000 mg | SUBCUTANEOUS | 1 refills | Status: DC
  Filled 2024-04-03 – 2024-04-05 (×3): qty 6, 84d supply, fill #0

## 2024-04-03 MED ORDER — ZEPBOUND 15 MG/0.5ML ~~LOC~~ SOAJ: 15.0000 mg | SUBCUTANEOUS | 1 refills | Status: DC

## 2024-04-05 ENCOUNTER — Encounter (HOSPITAL_COMMUNITY): Payer: Self-pay

## 2024-04-05 ENCOUNTER — Other Ambulatory Visit (HOSPITAL_COMMUNITY): Payer: Self-pay

## 2024-04-05 ENCOUNTER — Other Ambulatory Visit (HOSPITAL_BASED_OUTPATIENT_CLINIC_OR_DEPARTMENT_OTHER): Payer: Self-pay

## 2024-04-08 ENCOUNTER — Other Ambulatory Visit (HOSPITAL_COMMUNITY): Payer: Self-pay

## 2024-05-28 ENCOUNTER — Other Ambulatory Visit: Payer: Self-pay

## 2024-05-28 ENCOUNTER — Encounter: Payer: Self-pay | Admitting: Family Medicine

## 2024-05-28 MED ORDER — ALPRAZOLAM 0.5 MG PO TABS: 0.5000 mg | ORAL_TABLET | Freq: Every evening | ORAL | 0 refills | Status: AC | PRN

## 2024-06-03 ENCOUNTER — Encounter: Payer: Self-pay | Admitting: Registered Nurse

## 2024-06-03 DIAGNOSIS — J00 Acute nasopharyngitis [common cold]: Secondary | ICD-10-CM

## 2024-06-03 MED ORDER — FLUTICASONE PROPIONATE 50 MCG/ACT NA SUSP: 1.0000 | Freq: Two times a day (BID) | NASAL | 6 refills | Status: AC

## 2024-06-17 ENCOUNTER — Encounter: Payer: Self-pay | Admitting: Family Medicine

## 2024-06-17 ENCOUNTER — Ambulatory Visit: Admitting: Family Medicine

## 2024-06-17 VITALS — BP 132/82 | HR 88 | Temp 97.6°F | Ht 73.5 in | Wt 270.0 lb

## 2024-06-17 DIAGNOSIS — E7800 Pure hypercholesterolemia, unspecified: Secondary | ICD-10-CM | POA: Diagnosis not present

## 2024-06-17 DIAGNOSIS — Z0001 Encounter for general adult medical examination with abnormal findings: Secondary | ICD-10-CM | POA: Diagnosis not present

## 2024-06-17 DIAGNOSIS — I10 Essential (primary) hypertension: Secondary | ICD-10-CM

## 2024-06-17 DIAGNOSIS — Z23 Encounter for immunization: Secondary | ICD-10-CM | POA: Diagnosis not present

## 2024-06-17 DIAGNOSIS — Z0000 Encounter for general adult medical examination without abnormal findings: Secondary | ICD-10-CM

## 2024-06-17 NOTE — Progress Notes (Signed)
 "  Subjective:    Patient ID: Bruce Kemp, male    DOB: Nov 08, 1985, 39 y.o.   MRN: [id number]  HPI Patient is here today for complete physical exam.  Last year he was drinking more alcohol.  He has weaned back considerably.  He now drinks 1 drink a week and maybe drinks a few beers on the weekend.  He also has a history of fatty liver disease.  He has addressed this by starting Mounjaro .  He is now on the highest dose of Mounjaro . Wt Readings from Last 3 Encounters:  06/17/24 270 lb (122.5 kg)  02/23/24 274 lb 9.6 oz (124.6 kg)  10/30/23 298 lb (135.2 kg)   However his weight loss is stalled since his last visit.  He admits that he is not exercising regularly.  He denies any chest pain shortness of breath or dyspnea on exertion.  He is due for his flu shot. Past Medical History:  Diagnosis Date   Anxiety    Asthma    GERD (gastroesophageal reflux disease)    No past surgical history on file. Current Outpatient Medications on File Prior to Visit  Medication Sig Dispense Refill   ALPRAZolam  (XANAX ) 0.5 MG tablet Take 1 tablet (0.5 mg total) by mouth at bedtime as needed for anxiety. 30 tablet 0   etodolac  (LODINE ) 500 MG tablet Take 1 tablet (500 mg total) by mouth 2 (two) times daily. 180 tablet 1   fluticasone  (FLONASE ) 50 MCG/ACT nasal spray Place 1 spray into both nostrils 2 (two) times daily. 16 g 6   loratadine  (CLARITIN ) 10 MG tablet Take 1 tablet (10 mg total) by mouth daily. 90 tablet 3   methylphenidate  (METADATE  CD) 20 MG CR capsule Take 20 mg by mouth every morning.     methylphenidate  (METADATE  CD) 20 MG CR capsule Take 1 capsule (20 mg total) by mouth daily as directed. 30 capsule 0   pantoprazole  (PROTONIX ) 40 MG tablet TAKE 1 TABLET(40 MG) BY MOUTH DAILY 90 tablet 3   rosuvastatin  (CRESTOR ) 10 MG tablet TAKE 1 TABLET BY MOUTH EVERY DAY 90 tablet 0   tirzepatide  (ZEPBOUND ) 15 MG/0.5ML Pen Inject 15 mg into the skin once a week. 6 mL 1   valsartan  (DIOVAN ) 80 MG tablet Take  1 tablet (80 mg total) by mouth daily. 90 tablet 3   Vilazodone HCl (VIIBRYD) 40 MG TABS Take 40 mg by mouth daily.     No current facility-administered medications on file prior to visit.   No Known Allergies Social History   Socioeconomic History   Marital status: Single    Spouse name: Not on file   Number of children: Not on file   Years of education: Not on file   Highest education level: Not on file  Occupational History   Occupation: Engineer, Civil (consulting): [employer]  Tobacco Use   Smoking status: Former    Current packs/day: 0.00    Types: Cigarettes    Quit date: 09/12/2022    Years since quitting: 1.7    Passive exposure: Never   Smokeless tobacco: Never  Vaping Use   Vaping status: Never Used  Substance and Sexual Activity   Alcohol use: Yes    Comment: socially   Drug use: No   Sexual activity: Yes    Birth control/protection: Pill  Other Topics Concern   Not on file  Social History Narrative   Not on file   Social Drivers of Health  Tobacco Use: Medium Risk (02/23/2024)   Patient History    Smoking Tobacco Use: Former    Smokeless Tobacco Use: Never    Passive Exposure: Never  Programmer, Applications: Not on file  Food Insecurity: Not on file  Transportation Needs: Not on file  Physical Activity: Not on file  Stress: Not on file  Social Connections: Not on file  Intimate Partner Violence: Not on file  Depression (PHQ2-9): Low Risk (06/17/2024)   Depression (PHQ2-9)    PHQ-2 Score: 0  Alcohol Screen: Not on file  Housing: Not on file  Utilities: Not on file  Health Literacy: Not on file   Family History  Problem Relation Age of Onset   Diabetes Father    Hypertension Maternal Grandmother    Diabetes Maternal Grandfather    Hypertension Maternal Grandfather    Hypertension Paternal Grandmother    Hypertension Paternal Grandfather    Kidney cancer Neg Hx    Kidney disease Neg Hx    Prostate cancer Neg Hx       Review of  Systems  All other systems reviewed and are negative.      Objective:   Physical Exam Vitals reviewed.  Constitutional:      General: He is not in acute distress.    Appearance: Normal appearance. He is well-developed. He is obese. He is not diaphoretic.  HENT:     Head: Normocephalic and atraumatic.     Right Ear: Tympanic membrane and ear canal normal.     Left Ear: Tympanic membrane and ear canal normal.     Nose: No congestion or rhinorrhea.     Mouth/Throat:     Pharynx: No oropharyngeal exudate or posterior oropharyngeal erythema.  Eyes:     Conjunctiva/sclera: Conjunctivae normal.  Neck:     Thyroid: No thyromegaly.     Vascular: No carotid bruit or JVD.     Trachea: No tracheal deviation.  Cardiovascular:     Rate and Rhythm: Normal rate and regular rhythm.     Pulses: Normal pulses.     Heart sounds: Normal heart sounds.  Pulmonary:     Effort: Pulmonary effort is normal. No respiratory distress.     Breath sounds: Normal breath sounds. No wheezing, rhonchi or rales.  Abdominal:     General: Bowel sounds are normal. There is no distension.     Palpations: Abdomen is soft.     Tenderness: There is no abdominal tenderness. There is no guarding or rebound.  Musculoskeletal:     Cervical back: Neck supple.     Right lower leg: No edema.     Left lower leg: No edema.  Lymphadenopathy:     Cervical: No cervical adenopathy.  Skin:    Findings: No rash.  Neurological:     General: No focal deficit present.     Mental Status: He is alert and oriented to person, place, and time. Mental status is at baseline.     Motor: No weakness.     Coordination: Coordination is intact. Coordination normal.     Deep Tendon Reflexes: Reflexes normal.  Psychiatric:        Judgment: Judgment normal.           Assessment & Plan:  Pure hypercholesterolemia - Plan: CBC with Differential/Platelet, Comprehensive metabolic panel with GFR, Lipid panel  Benign essential  HTN  General medical exam Continue to encourage weight loss.  I would like to see the patient get 30 minutes to an hour  a day of aerobic exercise.  Continue to encourage to reduce alcohol consumption.  I will check a CMP today.  From his liver function test I will calculate his fib 4 index.  If the patient has an increased risk for cirrhosis I will get ultrasound elastography to determine if he would benefit from seeing hepatology.  Continue to recommend Mounjaro  and to push for additional weight loss through dietary and exercise changes.  Blood pressure today is acceptable.  Patient received his flu shot today.  If his cholesterol is good I plan to discontinue his rosuvastatin .  His cholesterol was excellent in September "

## 2024-06-17 NOTE — Addendum Note (Signed)
 Addended by: ANGELENA RONAL BRADLEY K on: 06/17/2024 10:47 AM   Modules accepted: Orders

## 2024-06-18 ENCOUNTER — Ambulatory Visit: Payer: Self-pay | Admitting: Family Medicine

## 2024-06-18 LAB — COMPREHENSIVE METABOLIC PANEL WITH GFR
AG Ratio: 1.6 (calc) (ref 1.0–2.5)
ALT: 62 U/L — ABNORMAL HIGH (ref 9–46)
AST: 39 U/L (ref 10–40)
Albumin: 4.5 g/dL (ref 3.6–5.1)
Alkaline phosphatase (APISO): 65 U/L (ref 36–130)
BUN/Creatinine Ratio: 8 (calc) (ref 6–22)
BUN: 10 mg/dL (ref 7–25)
CO2: 29 mmol/L (ref 20–32)
Calcium: 9.6 mg/dL (ref 8.6–10.3)
Chloride: 102 mmol/L (ref 98–110)
Creat: 1.27 mg/dL — ABNORMAL HIGH (ref 0.60–1.26)
Globulin: 2.9 g/dL (ref 1.9–3.7)
Glucose, Bld: 90 mg/dL (ref 65–99)
Potassium: 4.8 mmol/L (ref 3.5–5.3)
Sodium: 140 mmol/L (ref 135–146)
Total Bilirubin: 1 mg/dL (ref 0.2–1.2)
Total Protein: 7.4 g/dL (ref 6.1–8.1)
eGFR: 74 mL/min/1.73m2

## 2024-06-18 LAB — LIPID PANEL
Cholesterol: 179 mg/dL
HDL: 45 mg/dL
LDL Cholesterol (Calc): 107 mg/dL — ABNORMAL HIGH
Non-HDL Cholesterol (Calc): 134 mg/dL — ABNORMAL HIGH
Total CHOL/HDL Ratio: 4 (calc)
Triglycerides: 157 mg/dL — ABNORMAL HIGH

## 2024-06-18 LAB — CBC WITH DIFFERENTIAL/PLATELET
Absolute Lymphocytes: 2543 {cells}/uL (ref 850–3900)
Absolute Monocytes: 408 {cells}/uL (ref 200–950)
Basophils Absolute: 48 {cells}/uL (ref 0–200)
Basophils Relative: 0.7 %
Eosinophils Absolute: 367 {cells}/uL (ref 15–500)
Eosinophils Relative: 5.4 %
HCT: 53.3 % — ABNORMAL HIGH (ref 39.4–51.1)
Hemoglobin: 17.4 g/dL — ABNORMAL HIGH (ref 13.2–17.1)
MCH: 29.4 pg (ref 27.0–33.0)
MCHC: 32.6 g/dL (ref 31.6–35.4)
MCV: 90 fL (ref 81.4–101.7)
MPV: 10.5 fL (ref 7.5–12.5)
Monocytes Relative: 6 %
Neutro Abs: 3434 {cells}/uL (ref 1500–7800)
Neutrophils Relative %: 50.5 %
Platelets: 352 Thousand/uL (ref 140–400)
RBC: 5.92 Million/uL — ABNORMAL HIGH (ref 4.20–5.80)
RDW: 13 % (ref 11.0–15.0)
Total Lymphocyte: 37.4 %
WBC: 6.8 Thousand/uL (ref 3.8–10.8)

## 2024-07-02 ENCOUNTER — Other Ambulatory Visit: Payer: Self-pay | Admitting: Family Medicine

## 2024-07-15 ENCOUNTER — Other Ambulatory Visit (HOSPITAL_COMMUNITY): Payer: Self-pay

## 2024-07-15 ENCOUNTER — Other Ambulatory Visit: Payer: Self-pay

## 2024-07-15 ENCOUNTER — Encounter: Payer: Self-pay | Admitting: Family Medicine

## 2024-07-15 MED ORDER — ZEPBOUND 15 MG/0.5ML ~~LOC~~ SOAJ
15.0000 mg | SUBCUTANEOUS | 1 refills | Status: AC
  Filled 2024-07-15: qty 2, 28d supply, fill #0

## 2024-07-16 ENCOUNTER — Encounter: Payer: Self-pay | Admitting: Registered Nurse

## 2024-07-16 ENCOUNTER — Telehealth: Payer: Self-pay | Admitting: Registered Nurse

## 2024-07-16 DIAGNOSIS — J019 Acute sinusitis, unspecified: Secondary | ICD-10-CM

## 2024-07-16 MED ORDER — AMOXICILLIN-POT CLAVULANATE 875-125 MG PO TABS: 1.0000 | ORAL_TABLET | Freq: Two times a day (BID) | ORAL | Status: AC

## 2024-07-16 MED ORDER — SALINE SPRAY 0.65 % NA SOLN: 2.0000 | NASAL | Status: AC

## 2024-07-18 ENCOUNTER — Other Ambulatory Visit (HOSPITAL_COMMUNITY): Payer: Self-pay

## 2024-07-18 ENCOUNTER — Telehealth (HOSPITAL_COMMUNITY): Payer: Self-pay

## 2024-07-18 NOTE — Telephone Encounter (Signed)
 Where is this request coming from? Bruce Kemp  Medication: Zepbound  15mg  Prior authorization required? No If YES, on primary or secondary insurance? Primary Comments:  Cost exceeds maximum rejection

## 2024-07-19 ENCOUNTER — Other Ambulatory Visit (HOSPITAL_COMMUNITY): Payer: Self-pay

## 2024-07-19 NOTE — Telephone Encounter (Signed)
 Patient came into work for meeting then left to work remotely due to cough.  Feeling a little better since starting antibiotic.  Spouse now developing similar symptoms  Denied new symptoms or further needs at this time.

## 2024-07-19 NOTE — Telephone Encounter (Signed)
 PA request has been Received. New Encounter has been or will be created for follow up. For additional info see Pharmacy Prior Auth telephone encounter from 07/19/24.

## 2025-06-19 ENCOUNTER — Encounter: Admitting: Family Medicine
